# Patient Record
Sex: Male | Born: 1950 | Race: Black or African American | Hispanic: No | Marital: Single | Smoking: Current some day smoker
Health system: Southern US, Community
[De-identification: ages and names within clinical notes are randomized; demographics above are authoritative.]

## PROBLEM LIST (undated history)

## (undated) DIAGNOSIS — R51 Headache: Secondary | ICD-10-CM

## (undated) DIAGNOSIS — I10 Essential (primary) hypertension: Secondary | ICD-10-CM

## (undated) DIAGNOSIS — H9190 Unspecified hearing loss, unspecified ear: Secondary | ICD-10-CM

## (undated) DIAGNOSIS — K921 Melena: Secondary | ICD-10-CM

## (undated) DIAGNOSIS — N400 Enlarged prostate without lower urinary tract symptoms: Secondary | ICD-10-CM

## (undated) HISTORY — PX: NO PAST SURGERIES: SHX2092

---

## 2001-10-07 ENCOUNTER — Encounter: Payer: Self-pay | Admitting: *Deleted

## 2001-10-07 ENCOUNTER — Emergency Department (HOSPITAL_COMMUNITY): Admission: EM | Admit: 2001-10-07 | Discharge: 2001-10-07 | Payer: Self-pay | Admitting: *Deleted

## 2002-01-17 ENCOUNTER — Emergency Department (HOSPITAL_COMMUNITY): Admission: AC | Admit: 2002-01-17 | Discharge: 2002-01-18 | Payer: Self-pay

## 2002-01-17 ENCOUNTER — Encounter: Payer: Self-pay | Admitting: Emergency Medicine

## 2002-01-18 ENCOUNTER — Encounter: Payer: Self-pay | Admitting: Emergency Medicine

## 2002-04-23 ENCOUNTER — Emergency Department (HOSPITAL_COMMUNITY): Admission: EM | Admit: 2002-04-23 | Discharge: 2002-04-23 | Payer: Self-pay | Admitting: Emergency Medicine

## 2003-07-16 ENCOUNTER — Emergency Department (HOSPITAL_COMMUNITY): Admission: EM | Admit: 2003-07-16 | Discharge: 2003-07-16 | Payer: Self-pay | Admitting: *Deleted

## 2003-10-20 ENCOUNTER — Emergency Department (HOSPITAL_COMMUNITY): Admission: EM | Admit: 2003-10-20 | Discharge: 2003-10-20 | Payer: Self-pay | Admitting: Emergency Medicine

## 2004-02-14 ENCOUNTER — Emergency Department (HOSPITAL_COMMUNITY): Admission: EM | Admit: 2004-02-14 | Discharge: 2004-02-14 | Payer: Self-pay | Admitting: Emergency Medicine

## 2004-06-29 ENCOUNTER — Emergency Department (HOSPITAL_COMMUNITY): Admission: EM | Admit: 2004-06-29 | Discharge: 2004-06-29 | Payer: Self-pay | Admitting: Emergency Medicine

## 2004-12-29 ENCOUNTER — Ambulatory Visit: Payer: Self-pay | Admitting: Family Medicine

## 2005-03-20 ENCOUNTER — Emergency Department (HOSPITAL_COMMUNITY): Admission: EM | Admit: 2005-03-20 | Discharge: 2005-03-20 | Payer: Self-pay | Admitting: Emergency Medicine

## 2005-03-20 ENCOUNTER — Ambulatory Visit: Payer: Self-pay | Admitting: Family Medicine

## 2005-03-23 ENCOUNTER — Emergency Department (HOSPITAL_COMMUNITY): Admission: EM | Admit: 2005-03-23 | Discharge: 2005-03-23 | Payer: Self-pay | Admitting: Emergency Medicine

## 2005-06-26 ENCOUNTER — Emergency Department (HOSPITAL_COMMUNITY): Admission: EM | Admit: 2005-06-26 | Discharge: 2005-06-26 | Payer: Self-pay | Admitting: Emergency Medicine

## 2006-03-12 ENCOUNTER — Ambulatory Visit: Payer: Self-pay | Admitting: Family Medicine

## 2006-05-18 ENCOUNTER — Ambulatory Visit: Payer: Self-pay | Admitting: Family Medicine

## 2006-09-06 ENCOUNTER — Encounter: Admission: RE | Admit: 2006-09-06 | Discharge: 2006-09-06 | Payer: Self-pay | Admitting: Internal Medicine

## 2009-07-14 ENCOUNTER — Emergency Department (HOSPITAL_COMMUNITY): Admission: EM | Admit: 2009-07-14 | Discharge: 2009-07-14 | Payer: Self-pay | Admitting: Family Medicine

## 2010-05-01 ENCOUNTER — Encounter: Payer: Self-pay | Admitting: Internal Medicine

## 2010-05-14 ENCOUNTER — Emergency Department (HOSPITAL_COMMUNITY)
Admission: EM | Admit: 2010-05-14 | Discharge: 2010-05-14 | Disposition: A | Discharge status: Home / Self Care | Payer: No Typology Code available for payment source | Attending: Emergency Medicine | Admitting: Emergency Medicine

## 2010-05-14 ENCOUNTER — Emergency Department (HOSPITAL_COMMUNITY): Payer: Self-pay

## 2010-05-14 DIAGNOSIS — R4182 Altered mental status, unspecified: Secondary | ICD-10-CM | POA: Insufficient documentation

## 2010-05-14 DIAGNOSIS — M542 Cervicalgia: Secondary | ICD-10-CM | POA: Insufficient documentation

## 2010-05-14 DIAGNOSIS — R51 Headache: Secondary | ICD-10-CM | POA: Insufficient documentation

## 2010-06-29 LAB — POCT URINALYSIS DIP (DEVICE)
Glucose, UA: NEGATIVE mg/dL
Nitrite: NEGATIVE

## 2013-02-08 ENCOUNTER — Emergency Department (HOSPITAL_COMMUNITY): Payer: Medicare Other

## 2013-02-08 ENCOUNTER — Encounter (HOSPITAL_COMMUNITY): Payer: Self-pay | Admitting: Emergency Medicine

## 2013-02-08 ENCOUNTER — Emergency Department (HOSPITAL_COMMUNITY)
Admission: EM | Admit: 2013-02-08 | Discharge: 2013-02-08 | Disposition: A | Discharge status: Home / Self Care | Payer: Medicare Other | Attending: Emergency Medicine | Admitting: Emergency Medicine

## 2013-02-08 DIAGNOSIS — Z23 Encounter for immunization: Secondary | ICD-10-CM | POA: Insufficient documentation

## 2013-02-08 DIAGNOSIS — S20219A Contusion of unspecified front wall of thorax, initial encounter: Secondary | ICD-10-CM | POA: Insufficient documentation

## 2013-02-08 DIAGNOSIS — S02402A Zygomatic fracture, unspecified, initial encounter for closed fracture: Secondary | ICD-10-CM

## 2013-02-08 DIAGNOSIS — S20212A Contusion of left front wall of thorax, initial encounter: Secondary | ICD-10-CM

## 2013-02-08 DIAGNOSIS — Z8669 Personal history of other diseases of the nervous system and sense organs: Secondary | ICD-10-CM | POA: Insufficient documentation

## 2013-02-08 DIAGNOSIS — S0280XA Fracture of other specified skull and facial bones, unspecified side, initial encounter for closed fracture: Secondary | ICD-10-CM | POA: Insufficient documentation

## 2013-02-08 DIAGNOSIS — F17200 Nicotine dependence, unspecified, uncomplicated: Secondary | ICD-10-CM | POA: Insufficient documentation

## 2013-02-08 DIAGNOSIS — S0990XA Unspecified injury of head, initial encounter: Secondary | ICD-10-CM | POA: Insufficient documentation

## 2013-02-08 DIAGNOSIS — S3981XA Other specified injuries of abdomen, initial encounter: Secondary | ICD-10-CM | POA: Insufficient documentation

## 2013-02-08 DIAGNOSIS — S0510XA Contusion of eyeball and orbital tissues, unspecified eye, initial encounter: Secondary | ICD-10-CM | POA: Insufficient documentation

## 2013-02-08 HISTORY — DX: Unspecified hearing loss, unspecified ear: H91.90

## 2013-02-08 LAB — URINALYSIS, ROUTINE W REFLEX MICROSCOPIC
Ketones, ur: NEGATIVE mg/dL
Leukocytes, UA: NEGATIVE
Nitrite: NEGATIVE
Specific Gravity, Urine: >1.046 — ABNORMAL HIGH (ref 1.005–1.030)
pH: 5 (ref 5.0–8.0)

## 2013-02-08 LAB — BASIC METABOLIC PANEL
Chloride: 105 mEq/L (ref 96–112)
GFR calc Af Amer: 75 mL/min — ABNORMAL LOW (ref >90)
Potassium: 3.8 mEq/L (ref 3.5–5.1)
Sodium: 140 mEq/L (ref 135–145)

## 2013-02-08 MED ORDER — OXYCODONE-ACETAMINOPHEN 5-325 MG PO TABS: 1.0000 | ORAL_TABLET | ORAL | Status: DC | PRN

## 2013-02-08 MED ORDER — IOHEXOL 300 MG/ML  SOLN
100.0000 mL | Freq: Once | INTRAMUSCULAR | Status: AC | PRN
  Administered 2013-02-08: 100 mL via INTRAVENOUS

## 2013-02-08 MED ORDER — MORPHINE SULFATE 4 MG/ML IJ SOLN
4.0000 mg | Freq: Once | INTRAMUSCULAR | Status: AC
  Administered 2013-02-08: 4 mg via INTRAVENOUS
  Filled 2013-02-08: qty 1

## 2013-02-08 MED ORDER — TETANUS-DIPHTH-ACELL PERTUSSIS 5-2.5-18.5 LF-MCG/0.5 IM SUSP
0.5000 mL | Freq: Once | INTRAMUSCULAR | Status: AC
  Administered 2013-02-08: 0.5 mL via INTRAMUSCULAR
  Filled 2013-02-08: qty 0.5
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
## 2013-02-08 NOTE — ED Notes (Addendum)
Pt returned from xray. Ambulated to restroom without distress.

## 2013-02-08 NOTE — ED Notes (Signed)
Pt unable to void pt aware of needed void urinal at bedside
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
## 2013-02-08 NOTE — ED Notes (Signed)
PT ambulated with baseline gait; VSS; A&Ox3; no signs of distress; respirations even and unlabored; skin warm and dry; no questions upon discharge.  
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
## 2013-02-08 NOTE — ED Provider Notes (Signed)
Medical screening examination/treatment/procedure(s) were performed by non-physician practitioner and as supervising physician I was immediately available for consultation/collaboration.  EKG Interpretation   None       See other note  [name], MD 02/08/13 [phone]

## 2013-02-08 NOTE — ED Notes (Signed)
Patient transported to CT 
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
## 2013-02-18 DIAGNOSIS — S02402A Zygomatic fracture, unspecified, initial encounter for closed fracture: Secondary | ICD-10-CM | POA: Insufficient documentation

## 2013-02-18 DIAGNOSIS — S0230XA Fracture of orbital floor, unspecified side, initial encounter for closed fracture: Secondary | ICD-10-CM

## 2013-02-18 HISTORY — DX: Fracture of orbital floor, unspecified side, initial encounter for closed fracture: S02.30XA

## 2013-02-18 HISTORY — DX: Zygomatic fracture, unspecified side, initial encounter for closed fracture: S02.402A

## 2013-03-21 ENCOUNTER — Ambulatory Visit: Payer: Medicare PPO | Admitting: Internal Medicine

## 2013-04-18 ENCOUNTER — Ambulatory Visit: Payer: Medicare PPO | Admitting: Internal Medicine

## 2013-04-24 ENCOUNTER — Ambulatory Visit: Payer: Medicare PPO

## 2013-05-12 ENCOUNTER — Ambulatory Visit: Payer: Medicare PPO | Admitting: Internal Medicine

## 2013-05-17 ENCOUNTER — Ambulatory Visit: Payer: Medicare PPO

## 2013-05-23 ENCOUNTER — Ambulatory Visit: Payer: Medicare Other | Attending: Internal Medicine | Admitting: Internal Medicine

## 2013-05-23 VITALS — BP 138/84 | HR 93 | Temp 98.0°F | Resp 16 | Ht 69.5 in | Wt 190.0 lb

## 2013-05-23 DIAGNOSIS — F17200 Nicotine dependence, unspecified, uncomplicated: Secondary | ICD-10-CM | POA: Insufficient documentation

## 2013-05-23 DIAGNOSIS — Z0000 Encounter for general adult medical examination without abnormal findings: Secondary | ICD-10-CM | POA: Diagnosis not present

## 2013-05-23 DIAGNOSIS — H9190 Unspecified hearing loss, unspecified ear: Secondary | ICD-10-CM | POA: Diagnosis not present

## 2013-05-23 DIAGNOSIS — M48061 Spinal stenosis, lumbar region without neurogenic claudication: Secondary | ICD-10-CM | POA: Insufficient documentation

## 2013-05-23 DIAGNOSIS — M549 Dorsalgia, unspecified: Secondary | ICD-10-CM | POA: Insufficient documentation

## 2013-05-23 DIAGNOSIS — E559 Vitamin D deficiency, unspecified: Secondary | ICD-10-CM

## 2013-05-23 DIAGNOSIS — R21 Rash and other nonspecific skin eruption: Secondary | ICD-10-CM | POA: Diagnosis not present

## 2013-05-23 DIAGNOSIS — N400 Enlarged prostate without lower urinary tract symptoms: Secondary | ICD-10-CM | POA: Diagnosis not present

## 2013-05-23 DIAGNOSIS — E119 Type 2 diabetes mellitus without complications: Secondary | ICD-10-CM

## 2013-05-23 DIAGNOSIS — E785 Hyperlipidemia, unspecified: Secondary | ICD-10-CM | POA: Diagnosis not present

## 2013-05-23 LAB — CBC WITH DIFFERENTIAL/PLATELET
BASOS ABS: 0 10*3/uL (ref 0.0–0.1)
Basophils Relative: 1 % (ref 0–1)
EOS ABS: 0.1 10*3/uL (ref 0.0–0.7)
EOS PCT: 2 % (ref 0–5)
HEMATOCRIT: 35.1 % — AB (ref 39.0–52.0)
Hemoglobin: 11.8 g/dL — ABNORMAL LOW (ref 13.0–17.0)
LYMPHS ABS: 1.6 10*3/uL (ref 0.7–4.0)
LYMPHS PCT: 37 % (ref 12–46)
MCH: 31.3 pg (ref 26.0–34.0)
MCHC: 33.6 g/dL (ref 30.0–36.0)
MCV: 93.1 fL (ref 78.0–100.0)
MONO ABS: 0.6 10*3/uL (ref 0.1–1.0)
Monocytes Relative: 13 % — ABNORMAL HIGH (ref 3–12)
Neutro Abs: 2 10*3/uL (ref 1.7–7.7)
Neutrophils Relative %: 47 % (ref 43–77)
PLATELETS: 225 10*3/uL (ref 150–400)
RBC: 3.77 MIL/uL — ABNORMAL LOW (ref 4.22–5.81)
RDW: 15.5 % (ref 11.5–15.5)
WBC: 4.3 10*3/uL (ref 4.0–10.5)

## 2013-05-23 LAB — COMPLETE METABOLIC PANEL WITH GFR
ALT: 16 U/L (ref 0–53)
AST: 30 U/L (ref 0–37)
Albumin: 4.4 g/dL (ref 3.5–5.2)
Alkaline Phosphatase: 49 U/L (ref 39–117)
BILIRUBIN TOTAL: 0.3 mg/dL (ref 0.2–1.2)
BUN: 13 mg/dL (ref 6–23)
CALCIUM: 10.4 mg/dL (ref 8.4–10.5)
CHLORIDE: 103 meq/L (ref 96–112)
CO2: 24 meq/L (ref 19–32)
CREATININE: 1.18 mg/dL (ref 0.50–1.35)
GFR, EST AFRICAN AMERICAN: 77 mL/min
GFR, EST NON AFRICAN AMERICAN: 67 mL/min
Glucose, Bld: 96 mg/dL (ref 70–99)
Potassium: 4.5 mEq/L (ref 3.5–5.3)
Sodium: 140 mEq/L (ref 135–145)
Total Protein: 7.8 g/dL (ref 6.0–8.3)

## 2013-05-23 LAB — POCT URINALYSIS DIPSTICK
Bilirubin, UA: NEGATIVE
Glucose, UA: NEGATIVE
Ketones, UA: NEGATIVE
NITRITE UA: NEGATIVE
PH UA: 5.5
PROTEIN UA: 100
Spec Grav, UA: >=1.03
UROBILINOGEN UA: 0.2

## 2013-05-23 LAB — LIPID PANEL
CHOL/HDL RATIO: 6.1 ratio
CHOLESTEROL: 310 mg/dL — AB (ref 0–200)
HDL: 51 mg/dL (ref >39)
LDL Cholesterol: 181 mg/dL — ABNORMAL HIGH (ref 0–99)
Triglycerides: 388 mg/dL — ABNORMAL HIGH (ref <150)
VLDL: 78 mg/dL — ABNORMAL HIGH (ref 0–40)

## 2013-05-23 LAB — POCT GLYCOSYLATED HEMOGLOBIN (HGB A1C): Hemoglobin A1C: 5.3

## 2013-05-23 MED ORDER — CIPROFLOXACIN HCL 500 MG PO TABS: 500.0000 mg | ORAL_TABLET | Freq: Two times a day (BID) | ORAL | Status: DC

## 2013-05-23 MED ORDER — CYCLOBENZAPRINE HCL 10 MG PO TABS: 10.0000 mg | ORAL_TABLET | Freq: Every day | ORAL | Status: DC

## 2013-05-23 MED ORDER — TRAMADOL HCL 50 MG PO TABS: ORAL_TABLET | ORAL | Status: DC

## 2013-05-23 NOTE — Addendum Note (Signed)
Addended by: Allyson Sabal MD, Ascencion Dike on: 05/23/2013 04:44 PM   Modules accepted: Orders

## 2013-05-23 NOTE — Progress Notes (Signed)
Pt is here to establish care. Pt is here requesting to get his prostate checked
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
## 2013-05-24 ENCOUNTER — Ambulatory Visit: Payer: Medicare PPO

## 2013-05-24 LAB — TSH: TSH: 2.801 u[IU]/mL (ref 0.350–4.500)

## 2013-05-24 LAB — PSA: PSA: 1.97 ng/mL (ref <=4.00)

## 2013-05-26 ENCOUNTER — Telehealth: Payer: Self-pay | Admitting: *Deleted

## 2013-05-26 MED ORDER — GEMFIBROZIL 600 MG PO TABS: 600.0000 mg | ORAL_TABLET | Freq: Two times a day (BID) | ORAL | Status: DC

## 2013-05-26 NOTE — Telephone Encounter (Signed)
Left a voicemail for patient to give us a call back.

## 2013-05-26 NOTE — Telephone Encounter (Signed)
Patient's wife called back for patient's lab results. Notified patient of the triglycerides elevated at 388. Called in a prescription for Lopid 600 twice a day to our pharmacy. Marland Kitchen

## 2013-05-26 NOTE — Telephone Encounter (Signed)
Message copied by Magdalynn Davilla, [place] R on Mon May 26, 2013 12:31 PM ------      Message from: Allyson Sabal MD, [facility]      Created: Mon May 26, 2013 11:58 AM       Notify patient of the triglycerides elevated at 388. His call in a prescription for Lopid 600 twice a day ------

## 2013-06-03 ENCOUNTER — Telehealth: Payer: Self-pay | Admitting: Emergency Medicine

## 2013-06-03 NOTE — Telephone Encounter (Signed)
Message copied by Ricci Barker on Tue Jun 03, 2013  5:40 PM ------      Message from: Allyson Sabal MD, [facility]      Created: Mon May 26, 2013 11:58 AM       Notify patient of the triglycerides elevated at 388. His call in a prescription for Lopid 600 twice a day ------

## 2013-06-06 ENCOUNTER — Ambulatory Visit (HOSPITAL_COMMUNITY)
Admission: RE | Admit: 2013-06-06 | Discharge: 2013-06-06 | Disposition: A | Discharge status: Home / Self Care | Payer: Medicare Other | Source: Ambulatory Visit | Attending: Internal Medicine | Admitting: Internal Medicine

## 2013-06-06 ENCOUNTER — Other Ambulatory Visit: Payer: Self-pay | Admitting: Internal Medicine

## 2013-06-06 DIAGNOSIS — Z0000 Encounter for general adult medical examination without abnormal findings: Secondary | ICD-10-CM

## 2013-06-06 DIAGNOSIS — R109 Unspecified abdominal pain: Secondary | ICD-10-CM | POA: Insufficient documentation

## 2013-06-06 DIAGNOSIS — E119 Type 2 diabetes mellitus without complications: Secondary | ICD-10-CM

## 2013-06-06 DIAGNOSIS — E785 Hyperlipidemia, unspecified: Secondary | ICD-10-CM

## 2013-06-06 DIAGNOSIS — M549 Dorsalgia, unspecified: Secondary | ICD-10-CM | POA: Diagnosis not present

## 2013-06-06 DIAGNOSIS — N400 Enlarged prostate without lower urinary tract symptoms: Secondary | ICD-10-CM | POA: Diagnosis not present

## 2013-06-06 DIAGNOSIS — E559 Vitamin D deficiency, unspecified: Secondary | ICD-10-CM

## 2013-06-06 DIAGNOSIS — N281 Cyst of kidney, acquired: Secondary | ICD-10-CM | POA: Diagnosis not present

## 2013-06-09 ENCOUNTER — Ambulatory Visit: Payer: Medicare PPO | Attending: Internal Medicine

## 2013-07-19 ENCOUNTER — Other Ambulatory Visit: Payer: Self-pay | Admitting: Internal Medicine

## 2013-07-22 DIAGNOSIS — N400 Enlarged prostate without lower urinary tract symptoms: Secondary | ICD-10-CM | POA: Diagnosis not present

## 2013-07-22 DIAGNOSIS — Q619 Cystic kidney disease, unspecified: Secondary | ICD-10-CM | POA: Diagnosis not present

## 2013-07-22 DIAGNOSIS — S37019A Minor contusion of unspecified kidney, initial encounter: Secondary | ICD-10-CM | POA: Diagnosis not present

## 2013-08-20 ENCOUNTER — Ambulatory Visit: Payer: Medicare PPO | Admitting: Internal Medicine

## 2013-09-24 ENCOUNTER — Other Ambulatory Visit: Payer: Self-pay | Admitting: Gastroenterology

## 2013-09-26 ENCOUNTER — Encounter (HOSPITAL_COMMUNITY): Payer: Self-pay | Admitting: Pharmacy Technician

## 2013-09-29 ENCOUNTER — Encounter (HOSPITAL_COMMUNITY): Payer: Self-pay | Admitting: *Deleted

## 2013-10-09 ENCOUNTER — Ambulatory Visit (HOSPITAL_COMMUNITY)
Admission: RE | Admit: 2013-10-09 | Discharge: 2013-10-09 | Disposition: A | Discharge status: Home / Self Care | Payer: Medicare Other | Source: Ambulatory Visit | Attending: Gastroenterology | Admitting: Gastroenterology

## 2013-10-09 ENCOUNTER — Ambulatory Visit (HOSPITAL_COMMUNITY): Payer: Medicare Other | Admitting: Anesthesiology

## 2013-10-09 ENCOUNTER — Encounter (HOSPITAL_COMMUNITY): Payer: Medicare Other | Admitting: Anesthesiology

## 2013-10-09 ENCOUNTER — Encounter (HOSPITAL_COMMUNITY): Payer: Self-pay | Admitting: *Deleted

## 2013-10-09 ENCOUNTER — Encounter (HOSPITAL_COMMUNITY)
Admission: RE | Disposition: A | Discharge status: Home / Self Care | Payer: Self-pay | Source: Ambulatory Visit | Attending: Gastroenterology

## 2013-10-09 DIAGNOSIS — K921 Melena: Secondary | ICD-10-CM | POA: Insufficient documentation

## 2013-10-09 DIAGNOSIS — Z1211 Encounter for screening for malignant neoplasm of colon: Secondary | ICD-10-CM | POA: Diagnosis not present

## 2013-10-09 DIAGNOSIS — F17200 Nicotine dependence, unspecified, uncomplicated: Secondary | ICD-10-CM | POA: Diagnosis not present

## 2013-10-09 DIAGNOSIS — Z85038 Personal history of other malignant neoplasm of large intestine: Secondary | ICD-10-CM | POA: Insufficient documentation

## 2013-10-09 HISTORY — DX: Melena: K92.1

## 2013-10-09 HISTORY — DX: Headache: R51

## 2013-10-09 HISTORY — DX: Benign prostatic hyperplasia without lower urinary tract symptoms: N40.0

## 2013-10-09 HISTORY — PX: COLONOSCOPY WITH PROPOFOL: SHX5780

## 2013-10-09 SURGERY — COLONOSCOPY WITH PROPOFOL
Anesthesia: Monitor Anesthesia Care

## 2013-10-09 MED ORDER — LIDOCAINE HCL (CARDIAC) 20 MG/ML IV SOLN
INTRAVENOUS | Status: AC
  Filled 2013-10-09: qty 5

## 2013-10-09 MED ORDER — LIDOCAINE HCL (CARDIAC) 20 MG/ML IV SOLN
INTRAVENOUS | Status: DC | PRN
Start: 2013-10-09 — End: 2013-10-09
  Administered 2013-10-09: 100 mg via INTRAVENOUS

## 2013-10-09 MED ORDER — PROPOFOL 10 MG/ML IV BOLUS
INTRAVENOUS | Status: AC
  Filled 2013-10-09: qty 20

## 2013-10-09 MED ORDER — SODIUM CHLORIDE 0.9 % IV SOLN: INTRAVENOUS | Status: DC

## 2013-10-09 MED ORDER — PROPOFOL INFUSION 10 MG/ML OPTIME
INTRAVENOUS | Status: DC | PRN
  Administered 2013-10-09: 140 ug/kg/min via INTRAVENOUS

## 2013-10-09 MED ORDER — PROPOFOL 10 MG/ML IV BOLUS
INTRAVENOUS | Status: DC | PRN
  Administered 2013-10-09: 100 mg via INTRAVENOUS

## 2013-10-09 SURGICAL SUPPLY — 22 items

## 2013-10-09 NOTE — Anesthesia Preprocedure Evaluation (Addendum)
Anesthesia Evaluation  Patient identified by MRN, date of birth, ID band Patient awake  General Assessment Comment:Very HOH  Reviewed: Allergy & Precautions, H&P , NPO status , Patient's Chart, lab work & pertinent test results  Airway Mallampati: II TM Distance: >3 FB Neck ROM: Full    Dental no notable dental hx. (+) Edentulous Upper   Pulmonary neg pulmonary ROS, Current Smoker,  breath sounds clear to auscultation  Pulmonary exam normal       Cardiovascular negative cardio ROS  Rhythm:Regular Rate:Normal     Neuro/Psych negative neurological ROS  negative psych ROS   GI/Hepatic negative GI ROS, Neg liver ROS,   Endo/Other  negative endocrine ROS  Renal/GU negative Renal ROS  negative genitourinary   Musculoskeletal negative musculoskeletal ROS (+)   Abdominal   Peds negative pediatric ROS (+)  Hematology negative hematology ROS (+)   Anesthesia Other Findings   Reproductive/Obstetrics negative OB ROS                          Anesthesia Physical Anesthesia Plan  ASA: II  Anesthesia Plan: MAC   Post-op Pain Management:    Induction:   Airway Management Planned: Simple Face Mask  Additional Equipment:   Intra-op Plan:   Post-operative Plan:   Informed Consent: I have reviewed the patients History and Physical, chart, labs and discussed the procedure including the risks, benefits and alternatives for the proposed anesthesia with the patient or authorized representative who has indicated his/her understanding and acceptance.   Dental advisory given  Plan Discussed with: CRNA  Anesthesia Plan Comments:         Anesthesia Quick Evaluation

## 2013-10-09 NOTE — Transfer of Care (Signed)
Immediate Anesthesia Transfer of Care Note  Patient: Seth Santana  Procedure(s) Performed: Procedure(s): COLONOSCOPY WITH PROPOFOL (N/A)  Patient Location: PACU and Endoscopy Unit  Anesthesia Type:MAC  Level of Consciousness: awake and alert   Airway & Oxygen Therapy: Patient Spontanous Breathing and Patient connected to face mask oxygen  Post-op Assessment: Report given to PACU RN and Post -op Vital signs reviewed and stable  Post vital signs: Reviewed and stable  Complications: No apparent anesthesia complications

## 2013-10-09 NOTE — Op Note (Signed)
Problem: Intermittent painless hematochezia  Endoscopist: Earle Gell  Premedication: Propofol administered by anesthesia  Procedure: Diagnostic colonoscopy The patient was placed in the left lateral decubitus position. Anal inspection and digital rectal exam were normal. The Pentax pediatric colonoscope was introduced into the rectum and advanced to the cecum. A normal-appearing ileocecal valve and appendiceal orifice were identified. Colonic preparation for the exam today was good.  Rectum. Normal. Retroflex view of the distal rectum normal  Sigmoid colon and descending colon. Normal  Splenic flexure. Normal  Transverse colon. Normal  Hepatic flexure. Normal  Ascending colon. Normal  Cecum and ileocecal valve. Normal  Assessment: Normal screening proctocolonoscopy to the cecum  Recommendation: Schedule repeat screening colonoscopy in 10 years

## 2013-10-09 NOTE — H&P (Signed)
  Problem: Painless hematochezia  History: The patient is a 64 year old male born Apr 23, 1950. He is scheduled to undergo a diagnostic colonoscopy to evaluate intermittent painless hematochezia associated with otherwise normal bowel movements. His brother was diagnosed with colon cancer.  Medication allergies: None  Past medical history: Hard of hearing  Exam: The patient is alert and lying comfortably on the endoscopy stretcher. Cardiac exam reveals a regular rhythm. Lungs are clear to auscultation. Abdomen is soft and nontender to palpation  Plan: Proceed with diagnostic colonoscopy

## 2013-10-09 NOTE — Discharge Instructions (Signed)

## 2013-10-10 ENCOUNTER — Encounter (HOSPITAL_COMMUNITY): Payer: Self-pay | Admitting: Gastroenterology

## 2013-10-19 NOTE — Anesthesia Postprocedure Evaluation (Signed)
  Anesthesia Post-op Note  Patient: Seth Santana  Procedure(s) Performed: Procedure(s) (LRB): COLONOSCOPY WITH PROPOFOL (N/A)  Patient Location: PACU  Anesthesia Type: MAC  Level of Consciousness: awake and alert   Airway and Oxygen Therapy: Patient Spontanous Breathing  Post-op Pain: mild  Post-op Assessment: Post-op Vital signs reviewed, Patient's Cardiovascular Status Stable, Respiratory Function Stable, Patent Airway and No signs of Nausea or vomiting  Last Vitals:  Filed Vitals:   10/09/13 1430  BP: 176/94  Pulse: 69  Temp:   Resp: 22    Post-op Vital Signs: stable   Complications: No apparent anesthesia complications

## 2013-10-24 DIAGNOSIS — R319 Hematuria, unspecified: Secondary | ICD-10-CM | POA: Diagnosis not present

## 2013-10-24 DIAGNOSIS — Q619 Cystic kidney disease, unspecified: Secondary | ICD-10-CM | POA: Diagnosis not present

## 2013-10-24 DIAGNOSIS — N281 Cyst of kidney, acquired: Secondary | ICD-10-CM | POA: Diagnosis not present

## 2013-10-27 DIAGNOSIS — S37019A Minor contusion of unspecified kidney, initial encounter: Secondary | ICD-10-CM | POA: Diagnosis not present

## 2013-10-27 DIAGNOSIS — N400 Enlarged prostate without lower urinary tract symptoms: Secondary | ICD-10-CM | POA: Diagnosis not present

## 2013-10-27 DIAGNOSIS — Q619 Cystic kidney disease, unspecified: Secondary | ICD-10-CM | POA: Diagnosis not present

## 2014-04-09 ENCOUNTER — Other Ambulatory Visit: Payer: Self-pay | Admitting: Internal Medicine

## 2015-01-15 ENCOUNTER — Emergency Department (INDEPENDENT_AMBULATORY_CARE_PROVIDER_SITE_OTHER): Payer: Medicare Other

## 2015-01-15 ENCOUNTER — Encounter (HOSPITAL_COMMUNITY): Payer: Self-pay | Admitting: Emergency Medicine

## 2015-01-15 ENCOUNTER — Emergency Department (INDEPENDENT_AMBULATORY_CARE_PROVIDER_SITE_OTHER)
Admission: EM | Admit: 2015-01-15 | Discharge: 2015-01-16 | Disposition: A | Discharge status: Home / Self Care | Payer: Medicare Other | Source: Home / Self Care | Attending: Family Medicine | Admitting: Family Medicine

## 2015-01-15 ENCOUNTER — Emergency Department (HOSPITAL_COMMUNITY): Payer: Medicare Other

## 2015-01-15 DIAGNOSIS — M79661 Pain in right lower leg: Secondary | ICD-10-CM | POA: Diagnosis not present

## 2015-01-15 DIAGNOSIS — S8992XA Unspecified injury of left lower leg, initial encounter: Secondary | ICD-10-CM

## 2015-01-15 DIAGNOSIS — S8991XA Unspecified injury of right lower leg, initial encounter: Secondary | ICD-10-CM

## 2015-01-15 DIAGNOSIS — M79662 Pain in left lower leg: Secondary | ICD-10-CM | POA: Diagnosis not present

## 2015-01-15 DIAGNOSIS — M25571 Pain in right ankle and joints of right foot: Secondary | ICD-10-CM | POA: Diagnosis not present

## 2015-01-15 MED ORDER — TRAMADOL HCL 50 MG PO TABS: 50.0000 mg | ORAL_TABLET | Freq: Two times a day (BID) | ORAL | Status: DC | PRN
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
## 2015-01-15 NOTE — Discharge Instructions (Signed)
It was a pleasure to see you today.   I do not see evidence of a fracture of the legs on the x-rays.  I ask that you not bear weight on the right leg until you are seen by your primary doctor in follow-up.   I am giving you a prescription for Tramadol 50mg , take 1 tablet by mouth every 12 hours as needed for pain. Do not take with alcohol.   It is important that you follow up with your primary doctor in the coming 1-2 weeks to see how you are doing.

## 2015-04-22 DIAGNOSIS — E785 Hyperlipidemia, unspecified: Secondary | ICD-10-CM | POA: Diagnosis not present

## 2015-04-22 DIAGNOSIS — Z125 Encounter for screening for malignant neoplasm of prostate: Secondary | ICD-10-CM | POA: Diagnosis not present

## 2015-04-22 DIAGNOSIS — G621 Alcoholic polyneuropathy: Secondary | ICD-10-CM | POA: Diagnosis not present

## 2015-04-22 DIAGNOSIS — N400 Enlarged prostate without lower urinary tract symptoms: Secondary | ICD-10-CM | POA: Diagnosis not present

## 2015-07-01 DIAGNOSIS — Z0000 Encounter for general adult medical examination without abnormal findings: Secondary | ICD-10-CM | POA: Diagnosis not present

## 2015-07-01 DIAGNOSIS — E663 Overweight: Secondary | ICD-10-CM | POA: Diagnosis not present

## 2015-07-01 DIAGNOSIS — E785 Hyperlipidemia, unspecified: Secondary | ICD-10-CM | POA: Diagnosis not present

## 2015-07-27 DIAGNOSIS — F17210 Nicotine dependence, cigarettes, uncomplicated: Secondary | ICD-10-CM | POA: Diagnosis not present
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
## 2016-02-29 ENCOUNTER — Ambulatory Visit: Payer: Medicare Other | Admitting: Family Medicine

## 2016-06-16 DIAGNOSIS — R809 Proteinuria, unspecified: Secondary | ICD-10-CM | POA: Diagnosis not present

## 2016-06-16 DIAGNOSIS — Z5181 Encounter for therapeutic drug level monitoring: Secondary | ICD-10-CM | POA: Diagnosis not present

## 2016-06-16 DIAGNOSIS — G4700 Insomnia, unspecified: Secondary | ICD-10-CM | POA: Diagnosis not present

## 2016-06-16 DIAGNOSIS — Z125 Encounter for screening for malignant neoplasm of prostate: Secondary | ICD-10-CM | POA: Diagnosis not present

## 2016-06-16 DIAGNOSIS — I10 Essential (primary) hypertension: Secondary | ICD-10-CM | POA: Diagnosis not present

## 2016-06-16 DIAGNOSIS — Z131 Encounter for screening for diabetes mellitus: Secondary | ICD-10-CM | POA: Diagnosis not present

## 2016-07-03 DIAGNOSIS — Z720 Tobacco use: Secondary | ICD-10-CM | POA: Diagnosis not present

## 2016-07-03 DIAGNOSIS — E559 Vitamin D deficiency, unspecified: Secondary | ICD-10-CM | POA: Diagnosis not present

## 2016-07-03 DIAGNOSIS — G4700 Insomnia, unspecified: Secondary | ICD-10-CM | POA: Diagnosis not present

## 2016-07-03 DIAGNOSIS — R202 Paresthesia of skin: Secondary | ICD-10-CM | POA: Diagnosis not present

## 2016-07-03 DIAGNOSIS — E785 Hyperlipidemia, unspecified: Secondary | ICD-10-CM | POA: Diagnosis not present

## 2016-07-03 DIAGNOSIS — I10 Essential (primary) hypertension: Secondary | ICD-10-CM | POA: Diagnosis not present

## 2016-08-22 DIAGNOSIS — R202 Paresthesia of skin: Secondary | ICD-10-CM | POA: Diagnosis not present

## 2016-08-22 DIAGNOSIS — E785 Hyperlipidemia, unspecified: Secondary | ICD-10-CM | POA: Diagnosis not present

## 2016-08-22 DIAGNOSIS — E559 Vitamin D deficiency, unspecified: Secondary | ICD-10-CM | POA: Diagnosis not present

## 2016-08-22 DIAGNOSIS — Z720 Tobacco use: Secondary | ICD-10-CM | POA: Diagnosis not present

## 2016-08-22 DIAGNOSIS — I10 Essential (primary) hypertension: Secondary | ICD-10-CM | POA: Diagnosis not present

## 2016-08-22 DIAGNOSIS — G4700 Insomnia, unspecified: Secondary | ICD-10-CM | POA: Diagnosis not present

## 2016-10-18 DIAGNOSIS — R310 Gross hematuria: Secondary | ICD-10-CM | POA: Diagnosis not present

## 2016-10-18 DIAGNOSIS — Z125 Encounter for screening for malignant neoplasm of prostate: Secondary | ICD-10-CM | POA: Diagnosis not present

## 2016-10-18 DIAGNOSIS — N281 Cyst of kidney, acquired: Secondary | ICD-10-CM | POA: Diagnosis not present

## 2017-05-08 DIAGNOSIS — E559 Vitamin D deficiency, unspecified: Secondary | ICD-10-CM | POA: Diagnosis not present

## 2017-05-08 DIAGNOSIS — Z5181 Encounter for therapeutic drug level monitoring: Secondary | ICD-10-CM | POA: Diagnosis not present

## 2017-05-08 DIAGNOSIS — Z01118 Encounter for examination of ears and hearing with other abnormal findings: Secondary | ICD-10-CM | POA: Diagnosis not present

## 2017-05-08 DIAGNOSIS — E669 Obesity, unspecified: Secondary | ICD-10-CM | POA: Diagnosis not present

## 2017-05-08 DIAGNOSIS — R7303 Prediabetes: Secondary | ICD-10-CM | POA: Diagnosis not present

## 2017-05-08 DIAGNOSIS — E785 Hyperlipidemia, unspecified: Secondary | ICD-10-CM | POA: Diagnosis not present

## 2017-05-08 DIAGNOSIS — N400 Enlarged prostate without lower urinary tract symptoms: Secondary | ICD-10-CM | POA: Diagnosis not present

## 2017-05-08 DIAGNOSIS — Z131 Encounter for screening for diabetes mellitus: Secondary | ICD-10-CM | POA: Diagnosis not present

## 2017-05-08 DIAGNOSIS — H538 Other visual disturbances: Secondary | ICD-10-CM | POA: Diagnosis not present

## 2017-05-08 DIAGNOSIS — Z720 Tobacco use: Secondary | ICD-10-CM | POA: Diagnosis not present

## 2017-05-08 DIAGNOSIS — I10 Essential (primary) hypertension: Secondary | ICD-10-CM | POA: Diagnosis not present

## 2017-05-08 DIAGNOSIS — N182 Chronic kidney disease, stage 2 (mild): Secondary | ICD-10-CM | POA: Diagnosis not present

## 2017-06-26 DIAGNOSIS — N400 Enlarged prostate without lower urinary tract symptoms: Secondary | ICD-10-CM | POA: Diagnosis not present

## 2017-06-26 DIAGNOSIS — E669 Obesity, unspecified: Secondary | ICD-10-CM | POA: Diagnosis not present

## 2017-06-26 DIAGNOSIS — I10 Essential (primary) hypertension: Secondary | ICD-10-CM | POA: Diagnosis not present

## 2017-06-26 DIAGNOSIS — E785 Hyperlipidemia, unspecified: Secondary | ICD-10-CM | POA: Diagnosis not present

## 2017-06-26 DIAGNOSIS — Z720 Tobacco use: Secondary | ICD-10-CM | POA: Diagnosis not present

## 2017-06-26 DIAGNOSIS — R7303 Prediabetes: Secondary | ICD-10-CM | POA: Diagnosis not present

## 2017-06-26 DIAGNOSIS — N182 Chronic kidney disease, stage 2 (mild): Secondary | ICD-10-CM | POA: Diagnosis not present

## 2017-06-26 DIAGNOSIS — E559 Vitamin D deficiency, unspecified: Secondary | ICD-10-CM | POA: Diagnosis not present

## 2017-06-26 DIAGNOSIS — G629 Polyneuropathy, unspecified: Secondary | ICD-10-CM | POA: Diagnosis not present

## 2017-10-23 DIAGNOSIS — E785 Hyperlipidemia, unspecified: Secondary | ICD-10-CM | POA: Diagnosis not present

## 2017-10-23 DIAGNOSIS — N400 Enlarged prostate without lower urinary tract symptoms: Secondary | ICD-10-CM | POA: Diagnosis not present

## 2017-10-23 DIAGNOSIS — G629 Polyneuropathy, unspecified: Secondary | ICD-10-CM | POA: Diagnosis not present

## 2017-10-23 DIAGNOSIS — I10 Essential (primary) hypertension: Secondary | ICD-10-CM | POA: Diagnosis not present

## 2017-10-23 DIAGNOSIS — Z720 Tobacco use: Secondary | ICD-10-CM | POA: Diagnosis not present

## 2017-10-23 DIAGNOSIS — N182 Chronic kidney disease, stage 2 (mild): Secondary | ICD-10-CM | POA: Diagnosis not present

## 2017-10-23 DIAGNOSIS — E669 Obesity, unspecified: Secondary | ICD-10-CM | POA: Diagnosis not present

## 2017-10-23 DIAGNOSIS — D6489 Other specified anemias: Secondary | ICD-10-CM | POA: Diagnosis not present

## 2017-10-23 DIAGNOSIS — E559 Vitamin D deficiency, unspecified: Secondary | ICD-10-CM | POA: Diagnosis not present

## 2017-10-23 DIAGNOSIS — R7303 Prediabetes: Secondary | ICD-10-CM | POA: Diagnosis not present

## 2017-10-23 DIAGNOSIS — Z0000 Encounter for general adult medical examination without abnormal findings: Secondary | ICD-10-CM | POA: Diagnosis not present

## 2017-10-23 DIAGNOSIS — Z0001 Encounter for general adult medical examination with abnormal findings: Secondary | ICD-10-CM | POA: Diagnosis not present

## 2017-10-23 DIAGNOSIS — R740 Nonspecific elevation of levels of transaminase and lactic acid dehydrogenase [LDH]: Secondary | ICD-10-CM | POA: Diagnosis not present

## 2018-01-24 DIAGNOSIS — G629 Polyneuropathy, unspecified: Secondary | ICD-10-CM | POA: Diagnosis not present

## 2018-01-24 DIAGNOSIS — N182 Chronic kidney disease, stage 2 (mild): Secondary | ICD-10-CM | POA: Diagnosis not present

## 2018-01-24 DIAGNOSIS — E559 Vitamin D deficiency, unspecified: Secondary | ICD-10-CM | POA: Diagnosis not present

## 2018-01-24 DIAGNOSIS — Z720 Tobacco use: Secondary | ICD-10-CM | POA: Diagnosis not present

## 2018-01-24 DIAGNOSIS — N400 Enlarged prostate without lower urinary tract symptoms: Secondary | ICD-10-CM | POA: Diagnosis not present

## 2018-01-24 DIAGNOSIS — E7800 Pure hypercholesterolemia, unspecified: Secondary | ICD-10-CM | POA: Diagnosis not present

## 2018-01-24 DIAGNOSIS — E669 Obesity, unspecified: Secondary | ICD-10-CM | POA: Diagnosis not present

## 2018-01-24 DIAGNOSIS — I10 Essential (primary) hypertension: Secondary | ICD-10-CM | POA: Diagnosis not present

## 2018-01-24 DIAGNOSIS — R7303 Prediabetes: Secondary | ICD-10-CM | POA: Diagnosis not present

## 2018-02-04 DIAGNOSIS — D509 Iron deficiency anemia, unspecified: Secondary | ICD-10-CM | POA: Diagnosis not present

## 2018-02-04 DIAGNOSIS — N19 Unspecified kidney failure: Secondary | ICD-10-CM | POA: Diagnosis not present

## 2018-02-04 DIAGNOSIS — I10 Essential (primary) hypertension: Secondary | ICD-10-CM | POA: Diagnosis not present

## 2018-02-26 DIAGNOSIS — K2950 Unspecified chronic gastritis without bleeding: Secondary | ICD-10-CM | POA: Diagnosis not present

## 2018-02-26 DIAGNOSIS — K2990 Gastroduodenitis, unspecified, without bleeding: Secondary | ICD-10-CM | POA: Diagnosis not present

## 2018-02-26 DIAGNOSIS — D122 Benign neoplasm of ascending colon: Secondary | ICD-10-CM | POA: Diagnosis not present

## 2018-02-26 DIAGNOSIS — D124 Benign neoplasm of descending colon: Secondary | ICD-10-CM | POA: Diagnosis not present

## 2018-02-26 DIAGNOSIS — K635 Polyp of colon: Secondary | ICD-10-CM | POA: Diagnosis not present

## 2018-02-26 DIAGNOSIS — B9681 Helicobacter pylori [H. pylori] as the cause of diseases classified elsewhere: Secondary | ICD-10-CM | POA: Diagnosis not present

## 2018-02-26 DIAGNOSIS — D509 Iron deficiency anemia, unspecified: Secondary | ICD-10-CM | POA: Diagnosis not present

## 2018-02-26 DIAGNOSIS — K2970 Gastritis, unspecified, without bleeding: Secondary | ICD-10-CM | POA: Diagnosis not present

## 2018-02-28 DIAGNOSIS — R7303 Prediabetes: Secondary | ICD-10-CM | POA: Diagnosis not present

## 2018-02-28 DIAGNOSIS — G629 Polyneuropathy, unspecified: Secondary | ICD-10-CM | POA: Diagnosis not present

## 2018-02-28 DIAGNOSIS — E7800 Pure hypercholesterolemia, unspecified: Secondary | ICD-10-CM | POA: Diagnosis not present

## 2018-02-28 DIAGNOSIS — N182 Chronic kidney disease, stage 2 (mild): Secondary | ICD-10-CM | POA: Diagnosis not present

## 2018-02-28 DIAGNOSIS — E559 Vitamin D deficiency, unspecified: Secondary | ICD-10-CM | POA: Diagnosis not present

## 2018-02-28 DIAGNOSIS — N400 Enlarged prostate without lower urinary tract symptoms: Secondary | ICD-10-CM | POA: Diagnosis not present

## 2018-02-28 DIAGNOSIS — I10 Essential (primary) hypertension: Secondary | ICD-10-CM | POA: Diagnosis not present

## 2018-02-28 DIAGNOSIS — Z720 Tobacco use: Secondary | ICD-10-CM | POA: Diagnosis not present

## 2018-02-28 DIAGNOSIS — Z23 Encounter for immunization: Secondary | ICD-10-CM | POA: Diagnosis not present

## 2018-02-28 DIAGNOSIS — E669 Obesity, unspecified: Secondary | ICD-10-CM | POA: Diagnosis not present

## 2018-06-04 DIAGNOSIS — N400 Enlarged prostate without lower urinary tract symptoms: Secondary | ICD-10-CM | POA: Diagnosis not present

## 2018-06-04 DIAGNOSIS — E7800 Pure hypercholesterolemia, unspecified: Secondary | ICD-10-CM | POA: Diagnosis not present

## 2018-06-04 DIAGNOSIS — E669 Obesity, unspecified: Secondary | ICD-10-CM | POA: Diagnosis not present

## 2018-06-04 DIAGNOSIS — R7303 Prediabetes: Secondary | ICD-10-CM | POA: Diagnosis not present

## 2018-06-04 DIAGNOSIS — N182 Chronic kidney disease, stage 2 (mild): Secondary | ICD-10-CM | POA: Diagnosis not present

## 2018-06-04 DIAGNOSIS — I10 Essential (primary) hypertension: Secondary | ICD-10-CM | POA: Diagnosis not present

## 2018-06-04 DIAGNOSIS — Z720 Tobacco use: Secondary | ICD-10-CM | POA: Diagnosis not present

## 2018-06-04 DIAGNOSIS — G629 Polyneuropathy, unspecified: Secondary | ICD-10-CM | POA: Diagnosis not present

## 2018-06-04 DIAGNOSIS — E559 Vitamin D deficiency, unspecified: Secondary | ICD-10-CM | POA: Diagnosis not present

## 2018-06-10 DIAGNOSIS — D509 Iron deficiency anemia, unspecified: Secondary | ICD-10-CM | POA: Diagnosis not present

## 2018-06-10 DIAGNOSIS — A048 Other specified bacterial intestinal infections: Secondary | ICD-10-CM | POA: Diagnosis not present

## 2018-06-17 DIAGNOSIS — B353 Tinea pedis: Secondary | ICD-10-CM | POA: Diagnosis not present

## 2018-06-17 DIAGNOSIS — M79671 Pain in right foot: Secondary | ICD-10-CM | POA: Diagnosis not present

## 2018-06-17 DIAGNOSIS — M79672 Pain in left foot: Secondary | ICD-10-CM | POA: Diagnosis not present

## 2018-06-17 DIAGNOSIS — L600 Ingrowing nail: Secondary | ICD-10-CM | POA: Diagnosis not present

## 2018-07-08 DIAGNOSIS — D649 Anemia, unspecified: Secondary | ICD-10-CM | POA: Diagnosis not present

## 2018-07-08 DIAGNOSIS — I129 Hypertensive chronic kidney disease with stage 1 through stage 4 chronic kidney disease, or unspecified chronic kidney disease: Secondary | ICD-10-CM | POA: Diagnosis not present

## 2018-07-08 DIAGNOSIS — N182 Chronic kidney disease, stage 2 (mild): Secondary | ICD-10-CM | POA: Diagnosis not present

## 2018-07-25 DIAGNOSIS — L600 Ingrowing nail: Secondary | ICD-10-CM | POA: Diagnosis not present

## 2018-07-25 DIAGNOSIS — B351 Tinea unguium: Secondary | ICD-10-CM | POA: Diagnosis not present

## 2018-07-25 DIAGNOSIS — B353 Tinea pedis: Secondary | ICD-10-CM | POA: Diagnosis not present

## 2018-07-25 DIAGNOSIS — M79675 Pain in left toe(s): Secondary | ICD-10-CM | POA: Diagnosis not present

## 2018-07-25 DIAGNOSIS — M79674 Pain in right toe(s): Secondary | ICD-10-CM | POA: Diagnosis not present

## 2018-08-05 DIAGNOSIS — A048 Other specified bacterial intestinal infections: Secondary | ICD-10-CM | POA: Diagnosis not present

## 2018-09-04 DIAGNOSIS — E559 Vitamin D deficiency, unspecified: Secondary | ICD-10-CM | POA: Diagnosis not present

## 2018-09-04 DIAGNOSIS — E7800 Pure hypercholesterolemia, unspecified: Secondary | ICD-10-CM | POA: Diagnosis not present

## 2018-09-04 DIAGNOSIS — Z131 Encounter for screening for diabetes mellitus: Secondary | ICD-10-CM | POA: Diagnosis not present

## 2018-09-04 DIAGNOSIS — Z720 Tobacco use: Secondary | ICD-10-CM | POA: Diagnosis not present

## 2018-09-04 DIAGNOSIS — N182 Chronic kidney disease, stage 2 (mild): Secondary | ICD-10-CM | POA: Diagnosis not present

## 2018-09-04 DIAGNOSIS — Z125 Encounter for screening for malignant neoplasm of prostate: Secondary | ICD-10-CM | POA: Diagnosis not present

## 2018-09-04 DIAGNOSIS — N400 Enlarged prostate without lower urinary tract symptoms: Secondary | ICD-10-CM | POA: Diagnosis not present

## 2018-09-04 DIAGNOSIS — M1710 Unilateral primary osteoarthritis, unspecified knee: Secondary | ICD-10-CM | POA: Diagnosis not present

## 2018-09-04 DIAGNOSIS — E669 Obesity, unspecified: Secondary | ICD-10-CM | POA: Diagnosis not present

## 2018-09-04 DIAGNOSIS — I10 Essential (primary) hypertension: Secondary | ICD-10-CM | POA: Diagnosis not present

## 2018-09-04 DIAGNOSIS — R7303 Prediabetes: Secondary | ICD-10-CM | POA: Diagnosis not present

## 2018-09-04 DIAGNOSIS — G629 Polyneuropathy, unspecified: Secondary | ICD-10-CM | POA: Diagnosis not present

## 2018-09-25 DIAGNOSIS — D649 Anemia, unspecified: Secondary | ICD-10-CM | POA: Diagnosis not present

## 2018-10-29 DIAGNOSIS — E7800 Pure hypercholesterolemia, unspecified: Secondary | ICD-10-CM | POA: Diagnosis not present

## 2018-10-29 DIAGNOSIS — Z0001 Encounter for general adult medical examination with abnormal findings: Secondary | ICD-10-CM | POA: Diagnosis not present

## 2018-10-29 DIAGNOSIS — Z1389 Encounter for screening for other disorder: Secondary | ICD-10-CM | POA: Diagnosis not present

## 2018-10-29 DIAGNOSIS — N182 Chronic kidney disease, stage 2 (mild): Secondary | ICD-10-CM | POA: Diagnosis not present

## 2018-10-29 DIAGNOSIS — I10 Essential (primary) hypertension: Secondary | ICD-10-CM | POA: Diagnosis not present

## 2018-10-29 DIAGNOSIS — G629 Polyneuropathy, unspecified: Secondary | ICD-10-CM | POA: Diagnosis not present

## 2018-10-29 DIAGNOSIS — E669 Obesity, unspecified: Secondary | ICD-10-CM | POA: Diagnosis not present

## 2018-10-29 DIAGNOSIS — Z720 Tobacco use: Secondary | ICD-10-CM | POA: Diagnosis not present

## 2018-10-29 DIAGNOSIS — R7303 Prediabetes: Secondary | ICD-10-CM | POA: Diagnosis not present

## 2018-10-29 DIAGNOSIS — E559 Vitamin D deficiency, unspecified: Secondary | ICD-10-CM | POA: Diagnosis not present

## 2018-10-29 DIAGNOSIS — M1710 Unilateral primary osteoarthritis, unspecified knee: Secondary | ICD-10-CM | POA: Diagnosis not present

## 2018-10-29 DIAGNOSIS — N400 Enlarged prostate without lower urinary tract symptoms: Secondary | ICD-10-CM | POA: Diagnosis not present

## 2018-12-04 DIAGNOSIS — G629 Polyneuropathy, unspecified: Secondary | ICD-10-CM | POA: Diagnosis not present

## 2018-12-04 DIAGNOSIS — N182 Chronic kidney disease, stage 2 (mild): Secondary | ICD-10-CM | POA: Diagnosis not present

## 2018-12-04 DIAGNOSIS — E7800 Pure hypercholesterolemia, unspecified: Secondary | ICD-10-CM | POA: Diagnosis not present

## 2018-12-04 DIAGNOSIS — N400 Enlarged prostate without lower urinary tract symptoms: Secondary | ICD-10-CM | POA: Diagnosis not present

## 2018-12-04 DIAGNOSIS — M1710 Unilateral primary osteoarthritis, unspecified knee: Secondary | ICD-10-CM | POA: Diagnosis not present

## 2018-12-04 DIAGNOSIS — E559 Vitamin D deficiency, unspecified: Secondary | ICD-10-CM | POA: Diagnosis not present

## 2018-12-04 DIAGNOSIS — R7303 Prediabetes: Secondary | ICD-10-CM | POA: Diagnosis not present

## 2018-12-04 DIAGNOSIS — Z720 Tobacco use: Secondary | ICD-10-CM | POA: Diagnosis not present

## 2018-12-04 DIAGNOSIS — E669 Obesity, unspecified: Secondary | ICD-10-CM | POA: Diagnosis not present

## 2018-12-04 DIAGNOSIS — I10 Essential (primary) hypertension: Secondary | ICD-10-CM | POA: Diagnosis not present

## 2019-01-28 DIAGNOSIS — E559 Vitamin D deficiency, unspecified: Secondary | ICD-10-CM | POA: Diagnosis not present

## 2019-01-28 DIAGNOSIS — G629 Polyneuropathy, unspecified: Secondary | ICD-10-CM | POA: Diagnosis not present

## 2019-01-28 DIAGNOSIS — N182 Chronic kidney disease, stage 2 (mild): Secondary | ICD-10-CM | POA: Diagnosis not present

## 2019-01-28 DIAGNOSIS — Z720 Tobacco use: Secondary | ICD-10-CM | POA: Diagnosis not present

## 2019-01-28 DIAGNOSIS — E669 Obesity, unspecified: Secondary | ICD-10-CM | POA: Diagnosis not present

## 2019-01-28 DIAGNOSIS — R7303 Prediabetes: Secondary | ICD-10-CM | POA: Diagnosis not present

## 2019-01-28 DIAGNOSIS — E7800 Pure hypercholesterolemia, unspecified: Secondary | ICD-10-CM | POA: Diagnosis not present

## 2019-01-28 DIAGNOSIS — I10 Essential (primary) hypertension: Secondary | ICD-10-CM | POA: Diagnosis not present

## 2019-01-28 DIAGNOSIS — M1710 Unilateral primary osteoarthritis, unspecified knee: Secondary | ICD-10-CM | POA: Diagnosis not present

## 2019-01-28 DIAGNOSIS — N400 Enlarged prostate without lower urinary tract symptoms: Secondary | ICD-10-CM | POA: Diagnosis not present

## 2019-03-18 DIAGNOSIS — Z202 Contact with and (suspected) exposure to infections with a predominantly sexual mode of transmission: Secondary | ICD-10-CM | POA: Diagnosis not present

## 2019-03-18 DIAGNOSIS — I10 Essential (primary) hypertension: Secondary | ICD-10-CM | POA: Diagnosis not present

## 2019-03-18 DIAGNOSIS — E669 Obesity, unspecified: Secondary | ICD-10-CM | POA: Diagnosis not present

## 2019-03-18 DIAGNOSIS — N182 Chronic kidney disease, stage 2 (mild): Secondary | ICD-10-CM | POA: Diagnosis not present

## 2019-03-18 DIAGNOSIS — R3121 Asymptomatic microscopic hematuria: Secondary | ICD-10-CM | POA: Diagnosis not present

## 2019-03-18 DIAGNOSIS — N400 Enlarged prostate without lower urinary tract symptoms: Secondary | ICD-10-CM | POA: Diagnosis not present

## 2019-03-18 DIAGNOSIS — G629 Polyneuropathy, unspecified: Secondary | ICD-10-CM | POA: Diagnosis not present

## 2019-03-18 DIAGNOSIS — Z720 Tobacco use: Secondary | ICD-10-CM | POA: Diagnosis not present

## 2019-03-18 DIAGNOSIS — E559 Vitamin D deficiency, unspecified: Secondary | ICD-10-CM | POA: Diagnosis not present

## 2019-03-18 DIAGNOSIS — R7303 Prediabetes: Secondary | ICD-10-CM | POA: Diagnosis not present

## 2019-03-18 DIAGNOSIS — E7800 Pure hypercholesterolemia, unspecified: Secondary | ICD-10-CM | POA: Diagnosis not present

## 2019-07-24 DIAGNOSIS — E669 Obesity, unspecified: Secondary | ICD-10-CM | POA: Diagnosis not present

## 2019-07-24 DIAGNOSIS — G629 Polyneuropathy, unspecified: Secondary | ICD-10-CM | POA: Diagnosis not present

## 2019-07-24 DIAGNOSIS — R7303 Prediabetes: Secondary | ICD-10-CM | POA: Diagnosis not present

## 2019-07-24 DIAGNOSIS — Z720 Tobacco use: Secondary | ICD-10-CM | POA: Diagnosis not present

## 2019-07-24 DIAGNOSIS — N182 Chronic kidney disease, stage 2 (mild): Secondary | ICD-10-CM | POA: Diagnosis not present

## 2019-07-24 DIAGNOSIS — N400 Enlarged prostate without lower urinary tract symptoms: Secondary | ICD-10-CM | POA: Diagnosis not present

## 2019-07-24 DIAGNOSIS — E559 Vitamin D deficiency, unspecified: Secondary | ICD-10-CM | POA: Diagnosis not present

## 2019-07-24 DIAGNOSIS — I10 Essential (primary) hypertension: Secondary | ICD-10-CM | POA: Diagnosis not present

## 2019-07-24 DIAGNOSIS — H918X3 Other specified hearing loss, bilateral: Secondary | ICD-10-CM | POA: Diagnosis not present

## 2019-07-24 DIAGNOSIS — E7800 Pure hypercholesterolemia, unspecified: Secondary | ICD-10-CM | POA: Diagnosis not present

## 2019-12-08 ENCOUNTER — Ambulatory Visit: Payer: Medicare Other | Attending: Critical Care Medicine

## 2019-12-08 DIAGNOSIS — Z23 Encounter for immunization: Secondary | ICD-10-CM

## 2019-12-08 NOTE — Progress Notes (Signed)
   Covid-19 Vaccination Clinic  Name:  Seth Santana    MRN: [id number] DOB: [birth year]  12/08/2019  [name] was observed post Covid-19 immunization for 15 minutes without incident. He was provided with Vaccine Information Sheet and instruction to access the V-Safe system.   [name] was instructed to call 911 with any severe reactions post vaccine: Marland Kitchen Difficulty breathing  . Swelling of face and throat  . A fast heartbeat  . A bad rash all over body  . Dizziness and weakness   Immunizations Administered    Name Date Dose VIS Date Route   Moderna COVID-19 Vaccine 12/08/2019  1:33 PM 0.5 mL 03/2019 Intramuscular   Manufacturer: Moderna   Lot: [id number]   [place]: [id number]

## 2020-01-05 ENCOUNTER — Ambulatory Visit: Payer: Medicare Other

## 2020-01-06 ENCOUNTER — Ambulatory Visit: Payer: Medicare Other | Attending: Internal Medicine

## 2020-01-06 ENCOUNTER — Other Ambulatory Visit: Payer: Self-pay

## 2020-01-06 DIAGNOSIS — Z23 Encounter for immunization: Secondary | ICD-10-CM

## 2020-01-06 NOTE — Progress Notes (Signed)
   Covid-19 Vaccination Clinic  Name:  OBED SAMEK    MRN: [id number] DOB: 06-Sep-1950  01/06/2020  Mr. Atallah was observed post Covid-19 immunization for 15 minutes without incident. He was provided with Vaccine Information Sheet and instruction to access the V-Safe system.   Mr. Manninen was instructed to call 911 with any severe reactions post vaccine: Marland Kitchen Difficulty breathing  . Swelling of face and throat  . A fast heartbeat  . A bad rash all over body  . Dizziness and weakness   Immunizations Administered    Name Date Dose VIS Date Route   Moderna COVID-19 Vaccine 01/06/2020  9:59 AM 0.5 mL 03/2019 Intramuscular   Manufacturer: Moderna   Lot: [id number]   [place]: [id number]

## 2020-06-09 ENCOUNTER — Ambulatory Visit: Payer: Medicare Other | Attending: Critical Care Medicine

## 2020-06-09 ENCOUNTER — Other Ambulatory Visit: Payer: Self-pay

## 2020-06-30 ENCOUNTER — Ambulatory Visit: Payer: Medicare Other

## 2020-07-07 ENCOUNTER — Ambulatory Visit: Payer: Medicare Other

## 2020-10-13 ENCOUNTER — Ambulatory Visit: Payer: Medicare Other | Admitting: Family Medicine

## 2021-11-22 DIAGNOSIS — H903 Sensorineural hearing loss, bilateral: Secondary | ICD-10-CM | POA: Insufficient documentation

## 2021-11-30 ENCOUNTER — Encounter: Payer: Self-pay | Admitting: Podiatry

## 2021-11-30 ENCOUNTER — Ambulatory Visit (INDEPENDENT_AMBULATORY_CARE_PROVIDER_SITE_OTHER): Payer: Medicare HMO | Admitting: Podiatry

## 2021-11-30 DIAGNOSIS — R600 Localized edema: Secondary | ICD-10-CM

## 2021-11-30 DIAGNOSIS — B351 Tinea unguium: Secondary | ICD-10-CM

## 2021-11-30 DIAGNOSIS — M79674 Pain in right toe(s): Secondary | ICD-10-CM | POA: Diagnosis not present

## 2021-11-30 DIAGNOSIS — M79675 Pain in left toe(s): Secondary | ICD-10-CM | POA: Diagnosis not present

## 2021-11-30 NOTE — Progress Notes (Signed)
  Subjective:  Patient ID: Seth Santana, male    DOB: Aug 29, 1950,   MRN: [id number]  Chief Complaint  Patient presents with   Nail Problem     Routine foot care    71 y.o. male presents for concern of thickened elongated and painful nails that are difficult to trim. Requesting to have them trimmed today.  Relates swelling and pain in his feet. Takes gabapentin. He is not diabetic and not on any blood thinners as far as we can tell from chart. Last A1c was 5.3 but 5 years ago.   PCP:  Pcp, No    . Denies any other pedal complaints. Denies n/v/f/c.   Past Medical History:  Diagnosis Date   Blood in stool last few years   BPH (benign prostatic hyperplasia)    Hard of hearing    Headache(784.0)     Objective:  Physical Exam: Vascular: DP/PT pulses 2/4 bilateral. CFT <3 seconds. Normal hair growth on digits. No edema.  Skin. No lacerations or abrasions bilateral feet. Nails 1-5 b/l are thickened and elongated and discolored.  Musculoskeletal: MMT 5/5 bilateral lower extremities in DF, PF, Inversion and Eversion. Deceased ROM in DF of ankle joint.  Neurological: Sensation intact to light touch.   Assessment:   1. Pain due to onychomycosis of toenails of both feet   2. Localized edema      Plan:  Patient was evaluated and treated and all questions answered. -Discussed and educated patient on foot care, especially with  regards to the vascular, neurological and musculoskeletal systems.  -Discussed supportive shoes at all times and checking feet regularly.  -Mechanically debrided all nails 1-5 bilateral using sterile nail nipper and filed with dremel without incident  -Answered all patient questions -Discussed compression.  -Patient to return  in 3 months for at risk foot care -Patient advised to call the office if any problems or questions arise in the meantime.   Lorenda Peck, DPM

## 2021-12-06 ENCOUNTER — Ambulatory Visit: Payer: Medicare HMO | Admitting: Podiatry

## 2021-12-28 ENCOUNTER — Other Ambulatory Visit: Payer: Self-pay | Admitting: Family Medicine

## 2021-12-29 ENCOUNTER — Other Ambulatory Visit: Payer: Self-pay | Admitting: Infectious Diseases

## 2021-12-29 DIAGNOSIS — R609 Edema, unspecified: Secondary | ICD-10-CM

## 2021-12-30 ENCOUNTER — Ambulatory Visit
Admission: RE | Admit: 2021-12-30 | Discharge: 2021-12-30 | Disposition: A | Discharge status: Home / Self Care | Payer: Medicare Other | Source: Ambulatory Visit | Attending: Infectious Diseases | Admitting: Infectious Diseases

## 2021-12-30 DIAGNOSIS — R609 Edema, unspecified: Secondary | ICD-10-CM

## 2022-02-16 ENCOUNTER — Encounter (HOSPITAL_COMMUNITY): Payer: Self-pay | Admitting: Emergency Medicine

## 2022-02-16 ENCOUNTER — Ambulatory Visit (HOSPITAL_COMMUNITY)
Admission: EM | Admit: 2022-02-16 | Discharge: 2022-02-16 | Disposition: A | Discharge status: Home / Self Care | Payer: Medicare Other | Attending: Family Medicine | Admitting: Family Medicine

## 2022-02-16 DIAGNOSIS — R600 Localized edema: Secondary | ICD-10-CM | POA: Diagnosis present

## 2022-02-16 DIAGNOSIS — R197 Diarrhea, unspecified: Secondary | ICD-10-CM | POA: Diagnosis present

## 2022-02-16 LAB — COMPREHENSIVE METABOLIC PANEL
ALT: 20 U/L (ref 0–44)
AST: 34 U/L (ref 15–41)
Albumin: 3.8 g/dL (ref 3.5–5.0)
Alkaline Phosphatase: 56 U/L (ref 38–126)
Anion gap: 8 (ref 5–15)
BUN: 19 mg/dL (ref 8–23)
CO2: 21 mmol/L — ABNORMAL LOW (ref 22–32)
Calcium: 9.8 mg/dL (ref 8.9–10.3)
Chloride: 111 mmol/L (ref 98–111)
Creatinine, Ser: 2.14 mg/dL — ABNORMAL HIGH (ref 0.61–1.24)
GFR, Estimated: 33 mL/min — ABNORMAL LOW (ref >60)
Glucose, Bld: 94 mg/dL (ref 70–99)
Potassium: 4.5 mmol/L (ref 3.5–5.1)
Sodium: 140 mmol/L (ref 135–145)
Total Bilirubin: 0.7 mg/dL (ref 0.3–1.2)
Total Protein: 7.6 g/dL (ref 6.5–8.1)

## 2022-02-16 LAB — CBC
HCT: 27.7 % — ABNORMAL LOW (ref 39.0–52.0)
Hemoglobin: 9.1 g/dL — ABNORMAL LOW (ref 13.0–17.0)
MCH: 29.8 pg (ref 26.0–34.0)
MCHC: 32.9 g/dL (ref 30.0–36.0)
MCV: 90.8 fL (ref 80.0–100.0)
Platelets: 261 10*3/uL (ref 150–400)
RBC: 3.05 MIL/uL — ABNORMAL LOW (ref 4.22–5.81)
RDW: 14.2 % (ref 11.5–15.5)
WBC: 5 10*3/uL (ref 4.0–10.5)
nRBC: 0 % (ref 0.0–0.2)

## 2022-02-16 LAB — TSH: TSH: 4.159 u[IU]/mL (ref 0.350–4.500)

## 2022-02-16 LAB — BRAIN NATRIURETIC PEPTIDE: B Natriuretic Peptide: 35.1 pg/mL (ref 0.0–100.0)

## 2022-02-16 NOTE — ED Triage Notes (Signed)
Pt has issues with bilat leg swelling and peeling. C/o pain from knees to feet. Takes Furosemide. Scratched LLE last week or two. Put antibiotics ointment and bandage on it.  Past 2-3 days having diarrhea and decreased appetite. Taking Pepto Bismol over past day.   Patient itching all over.

## 2022-02-16 NOTE — Discharge Instructions (Signed)
Swelling happens when fluid collects in small spaces around tissues and organs inside the body. Another word for swelling is "edema." Some common parts of the body where people can have swelling are the lower legs or hands. This typically is worse in the areas of the body that are closest to the ground (because of gravity)  Symptoms of swelling can include puffiness of the skin, which can cause the skin to look stretched and shiny. This often occurs with swelling in the lower legs and can be worse after you sit or stand for a long time.  Treatment of edema includes several components: treatment of the underlying cause (if possible), reducing the amount of salt (sodium) in your diet, and, in many cases, use of a medication called a diuretic to eliminate excess fluid. Using compression stockings and elevating the legs may also be recommended.   You have had labs (blood work) sent today. We will call you with any significant abnormalities or if there is need to begin or change treatment or pursue further follow up.  You may also review your test results online through [facility]. If you do not have a MyChart account, instructions to sign up should be on your discharge paperwork.  Stop taking your amlodipine. This may be contributing to your leg swelling.

## 2022-02-21 NOTE — ED Provider Notes (Signed)
[place]   [id number] 02/16/22 Arrival Time: 1909  ASSESSMENT & PLAN:  1. Bilateral lower extremity edema   2. Diarrhea, unspecified type    Does not appear dehydrated. Will have him stop taking amlodipine as this could be contributing to LE edema.  Labs Reviewed  COMPREHENSIVE METABOLIC PANEL - Abnormal; Notable for the following components:      Result Value   CO2 21 (*)    Creatinine, Ser 2.14 (*)    GFR, Estimated 33 (*)    All other components within normal limits  CBC - Abnormal; Notable for the following components:   RBC 3.05 (*)    Hemoglobin 9.1 (*)    HCT 27.7 (*)    All other components within normal limits  TSH  BRAIN NATRIURETIC PEPTIDE   Has Lasix at home. Will adjust his Rx dose to 40 mg QD from 20 mg QD.  Elevated Cr address with pt call from RN. Does need to recheck this within a few days. Encourage fluid intake. Is also anemic.    Discharge Instructions      Swelling happens when fluid collects in small spaces around tissues and organs inside the body. Another word for swelling is "edema." Some common parts of the body where people can have swelling are the lower legs or hands. This typically is worse in the areas of the body that are closest to the ground (because of gravity)  Symptoms of swelling can include puffiness of the skin, which can cause the skin to look stretched and shiny. This often occurs with swelling in the lower legs and can be worse after you sit or stand for a long time.  Treatment of edema includes several components: treatment of the underlying cause (if possible), reducing the amount of salt (sodium) in your diet, and, in many cases, use of a medication called a diuretic to eliminate excess fluid. Using compression stockings and elevating the legs may also be recommended.   You have had labs (blood work) sent today. We will call you with any significant abnormalities or if there is need to begin or change treatment or  pursue further follow up.  You may also review your test results online through [facility]. If you do not have a MyChart account, instructions to sign up should be on your discharge paperwork.  Stop taking your amlodipine. This may be contributing to your leg swelling.      Discussed typical duration of symptoms for suspected viral GI illness. Will do his best to ensure adequate fluid intake in order to avoid dehydration. Will proceed to the Emergency Department for evaluation if unable to tolerate PO fluids regularly.  Otherwise he will f/u with his PCP or here if not showing improvement over the next 48-72 hours.  Reviewed expectations re: course of current medical issues. Questions answered. Outlined signs and symptoms indicating need for more acute intervention. Patient verbalized understanding. After Visit Summary given.   SUBJECTIVE: History from: patient and family.  Seth Santana is a 71 y.o. male who reports issues with bilat leg swelling and peeling; apparently long-standing; legs are painful when swelling is worse. Reports recent U/S negative for DVT. No recent illnesses. Has had mild non-bloody diarrhea lately; x 48 hours; not worsening. Feels he is drinking enough water. No abd pain. No CP/SOB. No new medications. Also reports generalized itching; unsure of duration.   Past Surgical History:  Procedure Laterality Date   COLONOSCOPY WITH PROPOFOL N/A 10/09/2013   Procedure:  COLONOSCOPY WITH PROPOFOL;  Surgeon: Garlan Fair, MD;  Location: [facility];  Service: Endoscopy;  Laterality: N/A;   NO PAST SURGERIES       OBJECTIVE:  Vitals:   02/16/22 1959  BP: (!) 146/83  Pulse: 75  Resp: 18  Temp: 99.1 F (37.3 C)  TempSrc: Oral  SpO2: 100%    General appearance: alert; no distress Oropharynx: moist Lungs: clear to auscultation bilaterally; unlabored Heart: regular rate and rhythm Abdomen: soft; non-distended; no significant abdominal tenderness; no  masses or organomegaly; no guarding or rebound tenderness Back: no CVA tenderness Extremities: bilateral LE edema; symmetrical with no gross deformities Skin: warm; dry Neurologic: normal gait Psychological: alert and cooperative; normal mood and affect  Labs: Results for orders placed or performed during the hospital encounter of 02/16/22  Comprehensive metabolic panel  Result Value Ref Range   Sodium 140 135 - 145 mmol/L   Potassium 4.5 3.5 - 5.1 mmol/L   Chloride 111 98 - 111 mmol/L   CO2 21 (L) 22 - 32 mmol/L   Glucose, Bld 94 70 - 99 mg/dL   BUN 19 8 - 23 mg/dL   Creatinine, Ser 2.14 (H) 0.61 - 1.24 mg/dL   Calcium 9.8 8.9 - 10.3 mg/dL   Total Protein 7.6 6.5 - 8.1 g/dL   Albumin 3.8 3.5 - 5.0 g/dL   AST 34 15 - 41 U/L   ALT 20 0 - 44 U/L   Alkaline Phosphatase 56 38 - 126 U/L   Total Bilirubin 0.7 0.3 - 1.2 mg/dL   GFR, Estimated 33 (L) >60 mL/min   Anion gap 8 5 - 15  CBC  Result Value Ref Range   WBC 5.0 4.0 - 10.5 K/uL   RBC 3.05 (L) 4.22 - 5.81 MIL/uL   Hemoglobin 9.1 (L) 13.0 - 17.0 g/dL   HCT 27.7 (L) 39.0 - 52.0 %   MCV 90.8 80.0 - 100.0 fL   MCH 29.8 26.0 - 34.0 pg   MCHC 32.9 30.0 - 36.0 g/dL   RDW 14.2 11.5 - 15.5 %   Platelets 261 150 - 400 K/uL   nRBC 0.0 0.0 - 0.2 %  TSH  Result Value Ref Range   TSH 4.159 0.350 - 4.500 uIU/mL  Brain natriuretic peptide  Result Value Ref Range   B Natriuretic Peptide 35.1 0.0 - 100.0 pg/mL   Labs Reviewed  COMPREHENSIVE METABOLIC PANEL - Abnormal; Notable for the following components:      Result Value   CO2 21 (*)    Creatinine, Ser 2.14 (*)    GFR, Estimated 33 (*)    All other components within normal limits  CBC - Abnormal; Notable for the following components:   RBC 3.05 (*)    Hemoglobin 9.1 (*)    HCT 27.7 (*)    All other components within normal limits  TSH  BRAIN NATRIURETIC PEPTIDE     No Known Allergies                                             Past Medical History:  Diagnosis Date    Blood in stool last few years   BPH (benign prostatic hyperplasia)    Hard of hearing    Headache(784.0)    Social History   Socioeconomic History   Marital status: Single    Spouse name: Not on  file   Number of children: Not on file   Years of education: Not on file   Highest education level: Not on file  Occupational History   Not on file  Tobacco Use   Smoking status: Every Day    Packs/day: 0.25    Years: 30.00    Total pack years: 7.50    Types: Cigarettes   Smokeless tobacco: Never  Substance and Sexual Activity   Alcohol use: Yes    Comment: beer 2 cans per day   Drug use: No   Sexual activity: Not on file  Other Topics Concern   Not on file  Social History Narrative   Not on file   Social Determinants of Health   Financial Resource Strain: Not on file  Food Insecurity: Not on file  Transportation Needs: Not on file  Physical Activity: Not on file  Stress: Not on file  Social Connections: Not on file  Intimate Partner Violence: Not on file   Family History  Problem Relation Age of Onset   Kidney disease Mother    Diabetes Mother    Cancer Brother       Vanessa Kick, MD 02/21/22 [phone]

## 2022-02-28 ENCOUNTER — Encounter: Payer: Self-pay | Admitting: Podiatry

## 2022-02-28 ENCOUNTER — Ambulatory Visit (INDEPENDENT_AMBULATORY_CARE_PROVIDER_SITE_OTHER): Payer: Medicare Other | Admitting: Podiatry

## 2022-02-28 DIAGNOSIS — G629 Polyneuropathy, unspecified: Secondary | ICD-10-CM

## 2022-02-28 DIAGNOSIS — E119 Type 2 diabetes mellitus without complications: Secondary | ICD-10-CM

## 2022-02-28 DIAGNOSIS — M79674 Pain in right toe(s): Secondary | ICD-10-CM | POA: Diagnosis not present

## 2022-02-28 DIAGNOSIS — L84 Corns and callosities: Secondary | ICD-10-CM | POA: Diagnosis not present

## 2022-02-28 DIAGNOSIS — M79675 Pain in left toe(s): Secondary | ICD-10-CM

## 2022-02-28 DIAGNOSIS — B351 Tinea unguium: Secondary | ICD-10-CM | POA: Diagnosis not present

## 2022-03-01 ENCOUNTER — Other Ambulatory Visit: Payer: Self-pay | Admitting: Urology

## 2022-03-04 NOTE — Progress Notes (Signed)
ANNUAL DIABETIC FOOT EXAM  Subjective: Seth Santana presents today for annual diabetic foot examination.  Chief Complaint  Patient presents with   foot care    Patient is here for routine foot care, patient is not diabetic, last seen primary care doctor  today 02/28/22.  Patient confirms h/o prediabetes.  Patient denies any h/o foot wounds.  Patient has been diagnosed with neuropathy and it is managed with gabapentin.  Risk factors: neuropathy, HTN, hyperlipidemia, current tobacco user.  Seth Clan, Seth Santana is patient's PCP.   Past Medical History:  Diagnosis Date   Blood in stool last few years   BPH (benign prostatic hyperplasia)    Hard of hearing    Headache(784.0)    Patient Active Problem List   Diagnosis Date Noted   Sensorineural hearing loss (SNHL) of both ears 11/22/2021   Closed fracture of tripod ([place]) 02/18/2013   Past Surgical History:  Procedure Laterality Date   COLONOSCOPY WITH PROPOFOL N/A 10/09/2013   Procedure: COLONOSCOPY WITH PROPOFOL;  Surgeon: Garlan Fair, MD;  Location: [facility];  Service: Endoscopy;  Laterality: N/A;   NO PAST SURGERIES     Current Outpatient Medications on File Prior to Visit  Medication Sig Dispense Refill   amLODipine-valsartan (EXFORGE) 5-320 MG tablet Take 1 tablet by mouth daily.     ciprofloxacin (CIPRO) 500 MG tablet Take 500 mg by mouth 2 (two) times daily.     cyclobenzaprine (FLEXERIL) 10 MG tablet Take 10 mg by mouth 3 (three) times daily as needed for muscle spasms.     finasteride (PROSCAR) 5 MG tablet Take 5 mg by mouth daily.     gabapentin (NEURONTIN) 300 MG capsule Take 300 mg by mouth 3 (three) times daily.     gemfibrozil (LOPID) 600 MG tablet Take 600 mg by mouth 2 (two) times daily before a meal.     hydrOXYzine (ATARAX) 10 MG tablet Take 10 mg by mouth every 8 (eight) hours as needed for itching.     LASIX 20 MG tablet Take 20 mg by mouth daily.     omeprazole (PRILOSEC) 40 MG capsule Take 40  mg by mouth daily.     rosuvastatin (CRESTOR) 5 MG tablet Take 5 mg by mouth daily.     traMADol (ULTRAM) 50 MG tablet Take 1 tablet (50 mg total) by mouth every 12 (twelve) hours as needed for moderate pain. 30 tablet 0   triamcinolone ointment (KENALOG) 0.1 % Apply 1 Application topically 2 (two) times daily.     No current facility-administered medications on file prior to visit.    No Known Allergies Social History   Occupational History   Not on file  Tobacco Use   Smoking status: Every Day    Packs/day: 0.25    Years: 30.00    Total pack years: 7.50    Types: Cigarettes   Smokeless tobacco: Never  Substance and Sexual Activity   Alcohol use: Yes    Comment: beer 2 cans per day   Drug use: No   Sexual activity: Not on file   Family History  Problem Relation Age of Onset   Kidney disease Mother    Diabetes Mother    Cancer Brother    Immunization History  Administered Date(s) Administered   [employer] Vaccination 12/08/2019, 01/06/2020   Tdap 02/08/2013     Review of Systems: Negative except as noted in the HPI.   Objective: There were no vitals filed for this visit.  Seth Santana is a pleasant 71 y.o. male in NAD. AAO X 3.  Vascular Examination: CFT <3 seconds b/l. DP/PT pulses faintly palpable b/l. Skin temperature gradient warm to warm b/l. No pain with calf compression. No ischemia or gangrene. No cyanosis or clubbing noted b/l.    Neurological Examination: Pt has subjective symptoms of neuropathy. Proprioception intact bilaterally. Sensation grossly intact b/l with 10 gram monofilament. Vibratory sensation intact b/l.   Dermatological Examination: Pedal skin warm and supple b/l. Toenails 1-5 b/l thick, discolored, elongated with subungual debris and pain on dorsal palpation.  Hyperkeratotic lesion(s) medial IPJ of right great toe.  No erythema, no edema, no drainage, no fluctuance.  Musculoskeletal Examination: Muscle strength 5/5 to b/l  LE. No pain, crepitus or joint limitation noted with ROM bilateral LE. No gross bony deformities bilaterally.  Radiographs: None  Footwear Assessment: Does the patient wear appropriate shoes? Yes. Does the patient need inserts/orthotics? No.  ADA Risk Categorization: Low Risk :  Patient has all of the following: Intact protective sensation No prior foot ulcer  No severe deformity Pedal pulses present  Assessment: 1. Pain due to onychomycosis of toenails of both feet   2. Callus   3. Polyneuropathy   4. Controlled type 2 diabetes mellitus without complication, without long-term current use of insulin ([place])   5. Encounter for diabetic foot exam [facility])     Plan: -Patient was evaluated and treated. All patient's and/or POA's questions/concerns answered on today's visit. -Diabetic foot examination performed today. -Toenails 1-5 b/l were debrided in length and girth with sterile nail nippers without iatrogenic bleeding. Patient declined use of dremel. -Callus(es) R hallux pared utilizing sterile scalpel blade without complication or incident. Total number debrided =1. -Patient/POA to call should there be question/concern in the interim. Return in about 3 months (around 05/31/2022).  Marzetta Board, DPM

## 2022-03-06 ENCOUNTER — Ambulatory Visit: Payer: Medicare Other | Admitting: Internal Medicine

## 2022-03-10 NOTE — Patient Instructions (Signed)
SURGICAL WAITING ROOM VISITATION Patients having surgery or a procedure may have no more than 2 support people in the waiting area - these visitors may rotate.   Children under the age of 48 must have an adult with them who is not the patient. If the patient needs to stay at the hospital during part of their recovery, the visitor guidelines for inpatient rooms apply. Pre-op nurse will coordinate an appropriate time for 1 support person to accompany patient in pre-op.  This support person may not rotate.    Please refer to the [facility] website for the visitor guidelines for Inpatients (after your surgery is over and you are in a regular room).     Your procedure is scheduled on: 03/15/22   Report to [facility] Main Entrance    Report to admitting at 10:00 AM   Call this number if you have problems the morning of surgery [phone]   Do not eat food or drink liquids :After Midnight.          If you have questions, please contact your surgeon's office.   FOLLOW BOWEL PREP AND ANY ADDITIONAL PRE OP INSTRUCTIONS YOU RECEIVED FROM YOUR SURGEON'S OFFICE!!!     Oral Hygiene is also important to reduce your risk of infection.                                    Remember - BRUSH YOUR TEETH THE MORNING OF SURGERY WITH YOUR REGULAR TOOTHPASTE  DENTURES WILL BE REMOVED PRIOR TO SURGERY PLEASE DO NOT APPLY "Poly grip" OR ADHESIVES!!!   Take these medicines the morning of surgery with A SIP OF WATER: Tylenol, Finasteride, Gabapentin, Omeprazole, Rosuvastatin                               You may not have any metal on your body including jewelry, and body piercing             Do not wear lotions, powders, cologne, or deodorant              Men may shave face and neck.   Do not bring valuables to the hospital. [place].   Contacts, glasses, dentures or bridgework may not be worn into surgery.  DO NOT [place]. PHARMACY WILL DISPENSE MEDICATIONS LISTED ON YOUR MEDICATION LIST TO YOU DURING YOUR ADMISSION [place]!    Patients discharged on the day of surgery will not be allowed to drive home.  Someone NEEDS to stay with you for the first 24 hours after anesthesia.              Please read over the following fact sheets you were given: IF [place] [phone] Santana    If you received a COVID test during your pre-op visit  it is requested that you wear a mask when out in public, stay away from anyone that may not be feeling well and notify your surgeon if you develop symptoms. If you test positive for Covid or have been in contact with anyone that has tested positive in the last 10 days please notify you surgeon.    [place] - Preparing  for Surgery Before surgery, you can play an important role.  Because skin is not sterile, your skin needs to be as free of germs as possible.  You can reduce the number of germs on your skin by washing with CHG (chlorahexidine gluconate) soap before surgery.  CHG is an antiseptic cleaner which kills germs and bonds with the skin to continue killing germs even after washing. Please DO NOT use if you have an allergy to CHG or antibacterial soaps.  If your skin becomes reddened/irritated stop using the CHG and inform your nurse when you arrive at Short Stay. Do not shave (including legs and underarms) for at least 48 hours prior to the first CHG shower.  You may shave your face/neck.  Please follow these instructions carefully:  1.  Shower with CHG Soap the night before surgery and the  morning of surgery.  2.  If you choose to wash your hair, wash your hair first as usual with your normal  shampoo.  3.  After you shampoo, rinse your hair and body thoroughly to remove the shampoo.                             4.  Use CHG as you would any other liquid soap.  You can apply chg directly to the skin and  wash.  Gently with a scrungie or clean washcloth.  5.  Apply the CHG Soap to your body ONLY FROM THE NECK DOWN.   Do   not use on face/ open                           Wound or open sores. Avoid contact with eyes, ears mouth and   genitals (private parts).                       Wash face,  Genitals (private parts) with your normal soap.             6.  Wash thoroughly, paying special attention to the area where your    surgery  will be performed.  7.  Thoroughly rinse your body with warm water from the neck down.  8.  DO NOT shower/wash with your normal soap after using and rinsing off the CHG Soap.                9.  Pat yourself dry with a clean towel.            10.  Wear clean pajamas.            11.  Place clean sheets on your bed the night of your first shower and do not  sleep with pets. Day of Surgery : Do not apply any lotions/deodorants the morning of surgery.  Please wear clean clothes to the hospital/surgery center.  FAILURE TO FOLLOW THESE INSTRUCTIONS MAY RESULT IN THE CANCELLATION OF YOUR SURGERY  PATIENT SIGNATURE_________________________________  NURSE SIGNATURE__________________________________  ________________________________________________________________________

## 2022-03-10 NOTE — Progress Notes (Addendum)
Patient is very hard of hearing even with hearing aids in  COVID Vaccine Completed: yes  Date of COVID positive in last 90 days: no  PCP - Dr. Claiborne Billings Cardiologist - n/a  Chest x-ray - n/a EKG - 03/13/22 Epic/chart Stress Test - n/a ECHO - n/a Cardiac Cath - n/a Pacemaker/ICD device last checked: n/a Spinal Cord Stimulator: n/a  Bowel Prep - no  Sleep Study - n/a CPAP -   Fasting Blood Sugar - n/a Checks Blood Sugar _____ times a day  Last dose of GLP1 agonist-  N/A GLP1 instructions:  N/A   Last dose of SGLT-2 inhibitors-  N/A SGLT-2 instructions: N/A   Blood Thinner Instructions: n/a Aspirin Instructions: Last Dose:  Activity level: Can go up a flight of stairs and perform activities of daily living without stopping and without symptoms of chest pain or shortness of breath. 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 Patient was also instructed that they will need to review over the PAT instructions again at home before surgery.

## 2022-03-13 ENCOUNTER — Encounter (HOSPITAL_COMMUNITY)
Admission: RE | Admit: 2022-03-13 | Discharge: 2022-03-13 | Disposition: A | Discharge status: Home / Self Care | Payer: Medicare Other | Source: Ambulatory Visit | Attending: Urology | Admitting: Urology

## 2022-03-13 ENCOUNTER — Encounter (HOSPITAL_COMMUNITY): Payer: Self-pay

## 2022-03-13 VITALS — BP 176/83 | HR 76 | Temp 98.6°F | Resp 14 | Ht 69.0 in | Wt 195.0 lb

## 2022-03-13 DIAGNOSIS — Z841 Family history of disorders of kidney and ureter: Secondary | ICD-10-CM | POA: Diagnosis not present

## 2022-03-13 DIAGNOSIS — I2510 Atherosclerotic heart disease of native coronary artery without angina pectoris: Secondary | ICD-10-CM

## 2022-03-13 DIAGNOSIS — Z23 Encounter for immunization: Secondary | ICD-10-CM | POA: Diagnosis not present

## 2022-03-13 DIAGNOSIS — F17290 Nicotine dependence, other tobacco product, uncomplicated: Secondary | ICD-10-CM | POA: Diagnosis not present

## 2022-03-13 DIAGNOSIS — I160 Hypertensive urgency: Secondary | ICD-10-CM | POA: Diagnosis not present

## 2022-03-13 DIAGNOSIS — J9601 Acute respiratory failure with hypoxia: Secondary | ICD-10-CM | POA: Diagnosis not present

## 2022-03-13 DIAGNOSIS — I130 Hypertensive heart and chronic kidney disease with heart failure and stage 1 through stage 4 chronic kidney disease, or unspecified chronic kidney disease: Secondary | ICD-10-CM | POA: Diagnosis not present

## 2022-03-13 DIAGNOSIS — N451 Epididymitis: Secondary | ICD-10-CM | POA: Diagnosis not present

## 2022-03-13 DIAGNOSIS — Z833 Family history of diabetes mellitus: Secondary | ICD-10-CM | POA: Diagnosis not present

## 2022-03-13 DIAGNOSIS — Z79899 Other long term (current) drug therapy: Secondary | ICD-10-CM | POA: Diagnosis not present

## 2022-03-13 DIAGNOSIS — I959 Hypotension, unspecified: Secondary | ICD-10-CM | POA: Diagnosis not present

## 2022-03-13 DIAGNOSIS — I5031 Acute diastolic (congestive) heart failure: Secondary | ICD-10-CM | POA: Diagnosis not present

## 2022-03-13 DIAGNOSIS — Z01818 Encounter for other preprocedural examination: Secondary | ICD-10-CM | POA: Insufficient documentation

## 2022-03-13 DIAGNOSIS — D638 Anemia in other chronic diseases classified elsewhere: Secondary | ICD-10-CM | POA: Diagnosis not present

## 2022-03-13 DIAGNOSIS — N452 Orchitis: Secondary | ICD-10-CM | POA: Diagnosis not present

## 2022-03-13 DIAGNOSIS — H903 Sensorineural hearing loss, bilateral: Secondary | ICD-10-CM | POA: Diagnosis not present

## 2022-03-13 DIAGNOSIS — N1832 Chronic kidney disease, stage 3b: Secondary | ICD-10-CM | POA: Diagnosis not present

## 2022-03-13 DIAGNOSIS — F17210 Nicotine dependence, cigarettes, uncomplicated: Secondary | ICD-10-CM | POA: Diagnosis not present

## 2022-03-13 DIAGNOSIS — K219 Gastro-esophageal reflux disease without esophagitis: Secondary | ICD-10-CM | POA: Diagnosis not present

## 2022-03-13 DIAGNOSIS — D62 Acute posthemorrhagic anemia: Secondary | ICD-10-CM | POA: Diagnosis not present

## 2022-03-13 HISTORY — DX: Essential (primary) hypertension: I10

## 2022-03-13 LAB — BASIC METABOLIC PANEL
Anion gap: 6 (ref 5–15)
BUN: 22 mg/dL (ref 8–23)
CO2: 24 mmol/L (ref 22–32)
Calcium: 9.6 mg/dL (ref 8.9–10.3)
Chloride: 110 mmol/L (ref 98–111)
Creatinine, Ser: 1.74 mg/dL — ABNORMAL HIGH (ref 0.61–1.24)
GFR, Estimated: 42 mL/min — ABNORMAL LOW (ref >60)
Glucose, Bld: 88 mg/dL (ref 70–99)
Potassium: 4.3 mmol/L (ref 3.5–5.1)
Sodium: 140 mmol/L (ref 135–145)

## 2022-03-13 LAB — CBC
HCT: 27.5 % — ABNORMAL LOW (ref 39.0–52.0)
Hemoglobin: 8.6 g/dL — ABNORMAL LOW (ref 13.0–17.0)
MCH: 29.2 pg (ref 26.0–34.0)
MCHC: 31.3 g/dL (ref 30.0–36.0)
MCV: 93.2 fL (ref 80.0–100.0)
Platelets: 259 10*3/uL (ref 150–400)
RBC: 2.95 MIL/uL — ABNORMAL LOW (ref 4.22–5.81)
RDW: 14.2 % (ref 11.5–15.5)
WBC: 6 10*3/uL (ref 4.0–10.5)
nRBC: 0 % (ref 0.0–0.2)

## 2022-03-14 NOTE — Progress Notes (Signed)
Hgb 8.6 and creatinine 1.74. results routed to Dr. Tresa Moore.

## 2022-03-15 ENCOUNTER — Encounter: Payer: Medicare Other | Admitting: Internal Medicine

## 2022-03-15 ENCOUNTER — Other Ambulatory Visit: Payer: Self-pay

## 2022-03-15 ENCOUNTER — Inpatient Hospital Stay (HOSPITAL_COMMUNITY)
Admission: RE | Admit: 2022-03-15 | Discharge: 2022-03-17 | DRG: 981 | Disposition: A | Discharge status: Home / Self Care | Payer: Medicare Other | Attending: Student | Admitting: Student

## 2022-03-15 ENCOUNTER — Ambulatory Visit (HOSPITAL_COMMUNITY): Payer: Medicare Other

## 2022-03-15 ENCOUNTER — Ambulatory Visit (HOSPITAL_BASED_OUTPATIENT_CLINIC_OR_DEPARTMENT_OTHER): Payer: Medicare Other | Admitting: Anesthesiology

## 2022-03-15 ENCOUNTER — Encounter (HOSPITAL_COMMUNITY): Payer: Self-pay | Admitting: Urology

## 2022-03-15 ENCOUNTER — Ambulatory Visit: Payer: Medicare Other | Admitting: Podiatry

## 2022-03-15 ENCOUNTER — Ambulatory Visit (HOSPITAL_COMMUNITY): Payer: Medicare Other | Admitting: Physician Assistant

## 2022-03-15 ENCOUNTER — Encounter (HOSPITAL_COMMUNITY)
Admission: RE | Disposition: A | Discharge status: Home / Self Care | Payer: Self-pay | Source: Home / Self Care | Attending: Student

## 2022-03-15 DIAGNOSIS — Z833 Family history of diabetes mellitus: Secondary | ICD-10-CM

## 2022-03-15 DIAGNOSIS — D649 Anemia, unspecified: Secondary | ICD-10-CM

## 2022-03-15 DIAGNOSIS — N400 Enlarged prostate without lower urinary tract symptoms: Secondary | ICD-10-CM | POA: Diagnosis present

## 2022-03-15 DIAGNOSIS — N401 Enlarged prostate with lower urinary tract symptoms: Secondary | ICD-10-CM

## 2022-03-15 DIAGNOSIS — F17210 Nicotine dependence, cigarettes, uncomplicated: Secondary | ICD-10-CM | POA: Diagnosis present

## 2022-03-15 DIAGNOSIS — L089 Local infection of the skin and subcutaneous tissue, unspecified: Secondary | ICD-10-CM | POA: Diagnosis not present

## 2022-03-15 DIAGNOSIS — F1290 Cannabis use, unspecified, uncomplicated: Secondary | ICD-10-CM | POA: Diagnosis present

## 2022-03-15 DIAGNOSIS — I959 Hypotension, unspecified: Secondary | ICD-10-CM | POA: Diagnosis present

## 2022-03-15 DIAGNOSIS — Z841 Family history of disorders of kidney and ureter: Secondary | ICD-10-CM

## 2022-03-15 DIAGNOSIS — K219 Gastro-esophageal reflux disease without esophagitis: Secondary | ICD-10-CM | POA: Diagnosis present

## 2022-03-15 DIAGNOSIS — N5089 Other specified disorders of the male genital organs: Secondary | ICD-10-CM | POA: Diagnosis not present

## 2022-03-15 DIAGNOSIS — F1010 Alcohol abuse, uncomplicated: Secondary | ICD-10-CM | POA: Diagnosis present

## 2022-03-15 DIAGNOSIS — Z79899 Other long term (current) drug therapy: Secondary | ICD-10-CM | POA: Diagnosis not present

## 2022-03-15 DIAGNOSIS — I130 Hypertensive heart and chronic kidney disease with heart failure and stage 1 through stage 4 chronic kidney disease, or unspecified chronic kidney disease: Secondary | ICD-10-CM | POA: Diagnosis present

## 2022-03-15 DIAGNOSIS — J811 Chronic pulmonary edema: Secondary | ICD-10-CM | POA: Diagnosis not present

## 2022-03-15 DIAGNOSIS — I5030 Unspecified diastolic (congestive) heart failure: Secondary | ICD-10-CM | POA: Insufficient documentation

## 2022-03-15 DIAGNOSIS — D638 Anemia in other chronic diseases classified elsewhere: Secondary | ICD-10-CM | POA: Diagnosis present

## 2022-03-15 DIAGNOSIS — N509 Disorder of male genital organs, unspecified: Secondary | ICD-10-CM | POA: Diagnosis present

## 2022-03-15 DIAGNOSIS — N451 Epididymitis: Secondary | ICD-10-CM | POA: Diagnosis present

## 2022-03-15 DIAGNOSIS — D62 Acute posthemorrhagic anemia: Secondary | ICD-10-CM | POA: Diagnosis present

## 2022-03-15 DIAGNOSIS — I5031 Acute diastolic (congestive) heart failure: Secondary | ICD-10-CM | POA: Diagnosis present

## 2022-03-15 DIAGNOSIS — Z01818 Encounter for other preprocedural examination: Secondary | ICD-10-CM | POA: Diagnosis not present

## 2022-03-15 DIAGNOSIS — H903 Sensorineural hearing loss, bilateral: Secondary | ICD-10-CM

## 2022-03-15 DIAGNOSIS — N1832 Chronic kidney disease, stage 3b: Secondary | ICD-10-CM | POA: Diagnosis not present

## 2022-03-15 DIAGNOSIS — N452 Orchitis: Secondary | ICD-10-CM | POA: Diagnosis present

## 2022-03-15 DIAGNOSIS — J9601 Acute respiratory failure with hypoxia: Secondary | ICD-10-CM | POA: Diagnosis not present

## 2022-03-15 DIAGNOSIS — I509 Heart failure, unspecified: Secondary | ICD-10-CM

## 2022-03-15 DIAGNOSIS — D509 Iron deficiency anemia, unspecified: Secondary | ICD-10-CM | POA: Diagnosis not present

## 2022-03-15 DIAGNOSIS — N492 Inflammatory disorders of scrotum: Secondary | ICD-10-CM | POA: Diagnosis present

## 2022-03-15 DIAGNOSIS — I10 Essential (primary) hypertension: Secondary | ICD-10-CM

## 2022-03-15 DIAGNOSIS — J90 Pleural effusion, not elsewhere classified: Secondary | ICD-10-CM | POA: Diagnosis not present

## 2022-03-15 DIAGNOSIS — Z23 Encounter for immunization: Secondary | ICD-10-CM

## 2022-03-15 DIAGNOSIS — F17290 Nicotine dependence, other tobacco product, uncomplicated: Secondary | ICD-10-CM | POA: Diagnosis present

## 2022-03-15 DIAGNOSIS — R0902 Hypoxemia: Secondary | ICD-10-CM | POA: Diagnosis not present

## 2022-03-15 DIAGNOSIS — J9600 Acute respiratory failure, unspecified whether with hypoxia or hypercapnia: Secondary | ICD-10-CM | POA: Diagnosis present

## 2022-03-15 DIAGNOSIS — I160 Hypertensive urgency: Secondary | ICD-10-CM | POA: Diagnosis present

## 2022-03-15 DIAGNOSIS — F17200 Nicotine dependence, unspecified, uncomplicated: Secondary | ICD-10-CM | POA: Diagnosis not present

## 2022-03-15 HISTORY — PX: ORCHIECTOMY: SHX2116

## 2022-03-15 LAB — BLOOD GAS, ARTERIAL
Acid-base deficit: 2.8 mmol/L — ABNORMAL HIGH (ref 0.0–2.0)
Bicarbonate: 21.9 mmol/L (ref 20.0–28.0)
Delivery systems: POSITIVE
Drawn by: 29503
Expiratory PAP: 6 cmH2O
FIO2: 70 %
Inspiratory PAP: 14 cmH2O
Mode: POSITIVE
O2 Saturation: 98.8 %
Patient temperature: 37
RATE: 14 resp/min
pCO2 arterial: 37 mmHg (ref 32–48)
pH, Arterial: 7.38 (ref 7.35–7.45)
pO2, Arterial: 114 mmHg — ABNORMAL HIGH (ref 83–108)

## 2022-03-15 LAB — BRAIN NATRIURETIC PEPTIDE: B Natriuretic Peptide: 278.6 pg/mL — ABNORMAL HIGH (ref 0.0–100.0)

## 2022-03-15 LAB — HIV ANTIBODY (ROUTINE TESTING W REFLEX): HIV Screen 4th Generation wRfx: NONREACTIVE

## 2022-03-15 LAB — TROPONIN I (HIGH SENSITIVITY): Troponin I (High Sensitivity): 8 ng/L (ref <18)

## 2022-03-15 LAB — MRSA NEXT GEN BY PCR, NASAL: MRSA by PCR Next Gen: NOT DETECTED

## 2022-03-15 SURGERY — ORCHIECTOMY
Anesthesia: General | Laterality: Right

## 2022-03-15 MED ORDER — BUPIVACAINE HCL (PF) 0.25 % IJ SOLN
INTRAMUSCULAR | Status: AC
  Filled 2022-03-15: qty 30

## 2022-03-15 MED ORDER — HYDRALAZINE HCL 20 MG/ML IJ SOLN
INTRAMUSCULAR | Status: AC
  Filled 2022-03-15: qty 1

## 2022-03-15 MED ORDER — BUPIVACAINE HCL (PF) 0.25 % IJ SOLN
INTRAMUSCULAR | Status: DC | PRN
  Administered 2022-03-15: 30 mL

## 2022-03-15 MED ORDER — OXYCODONE HCL 5 MG PO TABS: 5.0000 mg | ORAL_TABLET | Freq: Once | ORAL | Status: DC | PRN

## 2022-03-15 MED ORDER — FINASTERIDE 5 MG PO TABS
5.0000 mg | ORAL_TABLET | Freq: Every day | ORAL | Status: DC
  Administered 2022-03-16 – 2022-03-17 (×2): 5 mg via ORAL
  Filled 2022-03-15 (×2): qty 1

## 2022-03-15 MED ORDER — CEFAZOLIN SODIUM-DEXTROSE 2-4 GM/100ML-% IV SOLN
2.0000 g | INTRAVENOUS | Status: AC
  Administered 2022-03-15: 2 g via INTRAVENOUS
  Filled 2022-03-15: qty 100

## 2022-03-15 MED ORDER — SUGAMMADEX SODIUM 200 MG/2ML IV SOLN
INTRAVENOUS | Status: DC | PRN
  Administered 2022-03-15: 200 mg via INTRAVENOUS

## 2022-03-15 MED ORDER — LIDOCAINE 2% (20 MG/ML) 5 ML SYRINGE
INTRAMUSCULAR | Status: DC | PRN
  Administered 2022-03-15: 60 mg via INTRAVENOUS

## 2022-03-15 MED ORDER — OXYCODONE HCL 5 MG PO TABS: 5.0000 mg | ORAL_TABLET | Freq: Four times a day (QID) | ORAL | 0 refills | Status: DC | PRN

## 2022-03-15 MED ORDER — MIDAZOLAM HCL 2 MG/2ML IJ SOLN
INTRAMUSCULAR | Status: AC
  Filled 2022-03-15: qty 2

## 2022-03-15 MED ORDER — FENTANYL CITRATE PF 50 MCG/ML IJ SOSY
PREFILLED_SYRINGE | INTRAMUSCULAR | Status: AC
  Filled 2022-03-15: qty 3

## 2022-03-15 MED ORDER — SENNOSIDES-DOCUSATE SODIUM 8.6-50 MG PO TABS: 1.0000 | ORAL_TABLET | Freq: Two times a day (BID) | ORAL | 0 refills | Status: DC

## 2022-03-15 MED ORDER — ROCURONIUM BROMIDE 10 MG/ML (PF) SYRINGE
PREFILLED_SYRINGE | INTRAVENOUS | Status: DC | PRN
  Administered 2022-03-15: 50 mg via INTRAVENOUS

## 2022-03-15 MED ORDER — LACTATED RINGERS IV SOLN
INTRAVENOUS | Status: DC
  Administered 2022-03-15: 1000 mL via INTRAVENOUS

## 2022-03-15 MED ORDER — PNEUMOCOCCAL 20-VAL CONJ VACC 0.5 ML IM SUSY
0.5000 mL | PREFILLED_SYRINGE | INTRAMUSCULAR | Status: AC
  Administered 2022-03-16: 0.5 mL via INTRAMUSCULAR
  Filled 2022-03-15: qty 0.5

## 2022-03-15 MED ORDER — GABAPENTIN 300 MG PO CAPS
300.0000 mg | ORAL_CAPSULE | Freq: Two times a day (BID) | ORAL | Status: DC
  Administered 2022-03-15 – 2022-03-17 (×4): 300 mg via ORAL
  Filled 2022-03-15 (×4): qty 1

## 2022-03-15 MED ORDER — ROSUVASTATIN CALCIUM 5 MG PO TABS
5.0000 mg | ORAL_TABLET | Freq: Every day | ORAL | Status: DC
  Administered 2022-03-16 – 2022-03-17 (×2): 5 mg via ORAL
  Filled 2022-03-15 (×2): qty 1

## 2022-03-15 MED ORDER — ORAL CARE MOUTH RINSE: 15.0000 mL | Freq: Once | OROMUCOSAL | Status: AC

## 2022-03-15 MED ORDER — ROCURONIUM BROMIDE 10 MG/ML (PF) SYRINGE
PREFILLED_SYRINGE | INTRAVENOUS | Status: AC
  Filled 2022-03-15: qty 10

## 2022-03-15 MED ORDER — FUROSEMIDE 10 MG/ML IJ SOLN
40.0000 mg | Freq: Two times a day (BID) | INTRAMUSCULAR | Status: DC
  Administered 2022-03-15: 40 mg via INTRAVENOUS
  Filled 2022-03-15: qty 4

## 2022-03-15 MED ORDER — ONDANSETRON HCL 4 MG/2ML IJ SOLN
INTRAMUSCULAR | Status: AC
  Filled 2022-03-15: qty 2

## 2022-03-15 MED ORDER — HYDRALAZINE HCL 20 MG/ML IJ SOLN
10.0000 mg | INTRAMUSCULAR | Status: DC | PRN
  Administered 2022-03-15: 10 mg via INTRAVENOUS

## 2022-03-15 MED ORDER — 0.9 % SODIUM CHLORIDE (POUR BTL) OPTIME
TOPICAL | Status: DC | PRN
  Administered 2022-03-15: 1000 mL

## 2022-03-15 MED ORDER — SULFAMETHOXAZOLE-TRIMETHOPRIM 800-160 MG PO TABS: 1.0000 | ORAL_TABLET | Freq: Every day | ORAL | 0 refills | Status: DC

## 2022-03-15 MED ORDER — MIDAZOLAM HCL 2 MG/2ML IJ SOLN
INTRAMUSCULAR | Status: DC | PRN
  Administered 2022-03-15: 2 mg via INTRAVENOUS

## 2022-03-15 MED ORDER — PROPOFOL 10 MG/ML IV BOLUS
INTRAVENOUS | Status: AC
  Filled 2022-03-15: qty 20

## 2022-03-15 MED ORDER — CHLORHEXIDINE GLUCONATE 0.12 % MT SOLN
15.0000 mL | Freq: Once | OROMUCOSAL | Status: AC
  Administered 2022-03-15: 15 mL via OROMUCOSAL

## 2022-03-15 MED ORDER — DEXAMETHASONE SODIUM PHOSPHATE 10 MG/ML IJ SOLN
INTRAMUSCULAR | Status: DC | PRN
  Administered 2022-03-15: 4 mg via INTRAVENOUS

## 2022-03-15 MED ORDER — PROPOFOL 10 MG/ML IV BOLUS
INTRAVENOUS | Status: DC | PRN
  Administered 2022-03-15: 150 mg via INTRAVENOUS

## 2022-03-15 MED ORDER — FUROSEMIDE 10 MG/ML IJ SOLN
INTRAMUSCULAR | Status: AC
  Filled 2022-03-15: qty 6

## 2022-03-15 MED ORDER — HYDRALAZINE HCL 20 MG/ML IJ SOLN
10.0000 mg | Freq: Once | INTRAMUSCULAR | Status: AC
  Administered 2022-03-15: 10 mg via INTRAVENOUS

## 2022-03-15 MED ORDER — FUROSEMIDE 10 MG/ML IJ SOLN: 10.0000 mg | Freq: Once | INTRAMUSCULAR | Status: DC

## 2022-03-15 MED ORDER — OXYCODONE HCL 5 MG/5ML PO SOLN: 5.0000 mg | Freq: Once | ORAL | Status: DC | PRN

## 2022-03-15 MED ORDER — ONDANSETRON HCL 4 MG/2ML IJ SOLN
INTRAMUSCULAR | Status: DC | PRN
  Administered 2022-03-15: 4 mg via INTRAVENOUS

## 2022-03-15 MED ORDER — ORAL CARE MOUTH RINSE: 15.0000 mL | OROMUCOSAL | Status: DC | PRN

## 2022-03-15 MED ORDER — CHLORHEXIDINE GLUCONATE CLOTH 2 % EX PADS
6.0000 | MEDICATED_PAD | Freq: Every day | CUTANEOUS | Status: DC
  Administered 2022-03-15 – 2022-03-17 (×3): 6 via TOPICAL

## 2022-03-15 MED ORDER — FENTANYL CITRATE (PF) 100 MCG/2ML IJ SOLN
INTRAMUSCULAR | Status: AC
  Filled 2022-03-15: qty 2

## 2022-03-15 MED ORDER — DEXAMETHASONE SODIUM PHOSPHATE 10 MG/ML IJ SOLN
INTRAMUSCULAR | Status: AC
  Filled 2022-03-15: qty 1

## 2022-03-15 MED ORDER — FUROSEMIDE 10 MG/ML IJ SOLN
INTRAMUSCULAR | Status: AC
  Filled 2022-03-15: qty 2

## 2022-03-15 MED ORDER — HYDROMORPHONE HCL 1 MG/ML IJ SOLN
0.5000 mg | INTRAMUSCULAR | Status: DC | PRN
  Administered 2022-03-15 (×2): 0.5 mg via INTRAVENOUS
  Filled 2022-03-15 (×2): qty 1

## 2022-03-15 MED ORDER — LIDOCAINE HCL (PF) 2 % IJ SOLN
INTRAMUSCULAR | Status: AC
  Filled 2022-03-15: qty 5

## 2022-03-15 MED ORDER — IPRATROPIUM-ALBUTEROL 0.5-2.5 (3) MG/3ML IN SOLN
3.0000 mL | Freq: Once | RESPIRATORY_TRACT | Status: AC
  Administered 2022-03-15: 3 mL via RESPIRATORY_TRACT

## 2022-03-15 MED ORDER — LABETALOL HCL 5 MG/ML IV SOLN
10.0000 mg | INTRAVENOUS | Status: DC | PRN
  Administered 2022-03-15: 10 mg via INTRAVENOUS
  Filled 2022-03-15: qty 4

## 2022-03-15 MED ORDER — IPRATROPIUM-ALBUTEROL 0.5-2.5 (3) MG/3ML IN SOLN
RESPIRATORY_TRACT | Status: AC
  Filled 2022-03-15: qty 3

## 2022-03-15 MED ORDER — FENTANYL CITRATE PF 50 MCG/ML IJ SOSY
25.0000 ug | PREFILLED_SYRINGE | INTRAMUSCULAR | Status: DC | PRN
  Administered 2022-03-15 (×2): 50 ug via INTRAVENOUS

## 2022-03-15 MED ORDER — FENTANYL CITRATE (PF) 100 MCG/2ML IJ SOLN
INTRAMUSCULAR | Status: DC | PRN
  Administered 2022-03-15: 25 ug via INTRAVENOUS
  Administered 2022-03-15: 50 ug via INTRAVENOUS

## 2022-03-15 MED ORDER — IRBESARTAN 150 MG PO TABS
150.0000 mg | ORAL_TABLET | Freq: Every day | ORAL | Status: DC
  Administered 2022-03-15 – 2022-03-16 (×2): 150 mg via ORAL
  Filled 2022-03-15 (×2): qty 1

## 2022-03-15 MED ORDER — FUROSEMIDE 10 MG/ML IJ SOLN
60.0000 mg | Freq: Once | INTRAMUSCULAR | Status: AC
  Administered 2022-03-15: 60 mg via INTRAVENOUS

## 2022-03-15 MED ORDER — ONDANSETRON HCL 4 MG/2ML IJ SOLN: 4.0000 mg | Freq: Once | INTRAMUSCULAR | Status: DC | PRN

## 2022-03-15 MED ORDER — ACETAMINOPHEN 500 MG PO TABS
1000.0000 mg | ORAL_TABLET | Freq: Once | ORAL | Status: AC
  Administered 2022-03-15: 1000 mg via ORAL
  Filled 2022-03-15: qty 2

## 2022-03-15 SURGICAL SUPPLY — 39 items
ADH SKN CLS APL DERMABOND .7 (GAUZE/BANDAGES/DRESSINGS) ×1
APL SKNCLS STERI-STRIP NONHPOA (GAUZE/BANDAGES/DRESSINGS) ×1
BAG COUNTER SPONGE SURGICOUNT (BAG) IMPLANT
BAG SPNG CNTER NS LX DISP (BAG)
BENZOIN TINCTURE PRP APPL 2/3 (GAUZE/BANDAGES/DRESSINGS) ×2 IMPLANT
BLADE HEX COATED 2.75 (ELECTRODE) ×2 IMPLANT
BLADE SURG 15 STRL LF DISP TIS (BLADE) ×2 IMPLANT
BLADE SURG 15 STRL SS (BLADE) ×1
BNDG GAUZE DERMACEA FLUFF 4 (GAUZE/BANDAGES/DRESSINGS) ×2 IMPLANT
BNDG GZE DERMACEA 4 6PLY (GAUZE/BANDAGES/DRESSINGS) ×1
DERMABOND ADVANCED .7 DNX12 (GAUZE/BANDAGES/DRESSINGS) ×2 IMPLANT
DRAIN PENROSE 0.25X18 (DRAIN) ×2 IMPLANT
DRAIN PENROSE 0.5X18 (DRAIN) ×2 IMPLANT
DRAPE LAPAROTOMY T 98X78 PEDS (DRAPES) ×2 IMPLANT
ELECT REM PT RETURN 15FT ADLT (MISCELLANEOUS) ×2 IMPLANT
GAUZE PAD ABD 8X10 STRL (GAUZE/BANDAGES/DRESSINGS) IMPLANT
GAUZE SPONGE 4X4 12PLY STRL (GAUZE/BANDAGES/DRESSINGS) ×2 IMPLANT
GLOVE SURG LX STRL 7.5 STRW (GLOVE) ×2 IMPLANT
GOWN SRG XL LVL 4 BRTHBL STRL (GOWNS) ×2 IMPLANT
GOWN STRL NON-REIN XL LVL4 (GOWNS) ×1
KIT BASIN OR (CUSTOM PROCEDURE TRAY) ×2 IMPLANT
KIT TURNOVER KIT A (KITS) IMPLANT
NEEDLE HYPO 22GX1.5 SAFETY (NEEDLE) IMPLANT
NS IRRIG 1000ML POUR BTL (IV SOLUTION) IMPLANT
PACK BASIC VI WITH GOWN DISP (CUSTOM PROCEDURE TRAY) ×2 IMPLANT
PENCIL SMOKE EVACUATOR (MISCELLANEOUS) IMPLANT
SPONGE T-LAP 4X18 ~~LOC~~+RFID (SPONGE) ×4 IMPLANT
SUPPORT SCROTAL LG STRP (MISCELLANEOUS) ×2 IMPLANT
SUT ETHILON 3 0 PS 1 (SUTURE) IMPLANT
SUT MNCRL AB 4-0 PS2 18 (SUTURE) ×2 IMPLANT
SUT SILK 0 (SUTURE) ×1
SUT SILK 0 30XBRD TIE 6 (SUTURE) ×2 IMPLANT
SUT VIC AB 3-0 SH 27 (SUTURE) ×2
SUT VIC AB 3-0 SH 27XBRD (SUTURE) ×2 IMPLANT
SYR 20ML LL LF (SYRINGE) ×2 IMPLANT
SYR CONTROL 10ML LL (SYRINGE) IMPLANT
WATER STERILE IRR 1000ML POUR (IV SOLUTION) IMPLANT
YANKAUER SUCT BULB TIP 10FT TU (MISCELLANEOUS) IMPLANT
YANKAUER SUCT BULB TIP NO VENT (SUCTIONS) IMPLANT
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
## 2022-03-15 NOTE — Anesthesia Postprocedure Evaluation (Signed)
Anesthesia Post Note  Patient: Seth Santana  Procedure(s) Performed: INGUINAL RADICAL ORCHIECTOMY (Right)     Patient location during evaluation: PACU Anesthesia Type: General Level of consciousness: awake and alert Pain management: pain level controlled Respiratory status: spontaneous breathing and non-rebreather facemask Cardiovascular status: stable Postop Assessment: no apparent nausea or vomiting Anesthetic complications: no Comments: Patient with hypoxia in PACU, requiring O2 via NRBM. Initially hypertensive with SBP ~200, DBP 120's. Treated with hydralazine. Lungs with rhonchi b/l. CXR showing pulmonary congestion, read as CHF, hard to exclude aspiration. No signs of aspiration during procedure/emergence. Lasix given, CPAP ordered. Discussed with surgeon, will consult medicine team for admission and further care.   No notable events documented.  Last Vitals:  Vitals:   03/15/22 1430 03/15/22 1445  BP: (!) 186/99 (!) 181/97  Pulse: 81 86  Resp: (!) 32 (!) 37  Temp:    SpO2: 93% (!) 77%    Last Pain:  Vitals:   03/15/22 1445  TempSrc:   PainSc: [place]

## 2022-03-15 NOTE — Discharge Instructions (Addendum)
1- Stiches - Your stitches are all dissolvable. You may notice a "loose thread" at your incisions, these are normal and require no intervention. You may cut them flush to the skin with fingernail clippers if needed for comfort.  2 - Diet - No restrictions  3- Activity - No heavy lifting / straining (any activities that require valsalva or "bearing down") x 2 weeks. Otherwise, no restrictions.  4 - Bathing - You may shower immediately. Do not take a bath or get into swimming pool where incision sites are submersed in water x 4 weeks.   5- Drain - thin pink / blood discharge from area of scrotal drain is expected. Recommend using menstrual type pad in snug fitting underwear for about a week.   6 - When to Call the Doctor - Call MD for any fever >102, any acute wound problems, or any severe nausea / vomiting. You can call the [facility] [phone]) 24 hours a day 365 days a year. It will roll-over to the answering service and on-call physician after hours.

## 2022-03-15 NOTE — Evaluation (Signed)
SLP Cancellation Note  Patient Details Name: WIATT MAHABIR MRN: [id number] DOB: 05-02-1950   Cancelled treatment:       Reason Eval/Treat Not Completed: Other (comment) (pt on bipap, will follow up next date)   [name] 03/15/2022, 5:17 PM  Kathleen Lime, MS [place] Office [phone] Pager [phone]

## 2022-03-15 NOTE — Anesthesia Procedure Notes (Signed)
Procedure Name: Intubation Date/Time: 03/15/2022 11:53 AM  Performed by: Niel Hummer, CRNAPre-anesthesia Checklist: Patient identified, Emergency Drugs available, Suction available and Patient being monitored Patient Re-evaluated:Patient Re-evaluated prior to induction Oxygen Delivery Method: Circle system utilized Preoxygenation: Pre-oxygenation with 100% oxygen Induction Type: IV induction Ventilation: Two handed mask ventilation required Laryngoscope Size: Mac and 4 Grade View: Grade II Tube type: Oral Tube size: 7.5 mm Number of attempts: 1 Airway Equipment and Method: Stylet Placement Confirmation: ETT inserted through vocal cords under direct vision, positive ETCO2 and breath sounds checked- equal and bilateral Secured at: 23 cm Tube secured with: Tape Dental Injury: Teeth and Oropharynx as per pre-operative assessment

## 2022-03-15 NOTE — Brief Op Note (Signed)
03/15/2022  12:58 PM  PATIENT:  Seth Santana  71 y.o. male  PRE-OPERATIVE DIAGNOSIS:  RIGHT TESTIS MASS  POST-OPERATIVE DIAGNOSIS:  RIGHT TESTIS MASS  PROCEDURE:  Procedure(s): INGUINAL RADICAL ORCHIECTOMY (Right)  SURGEON:  Surgeon(s) and Role:    * [name], MD - Primary  PHYSICIAN ASSISTANT:   ASSISTANTS: none   ANESTHESIA:   general + local  EBL:  20 mL   BLOOD ADMINISTERED:none  DRAINS: Penrose drain in the dependant scrotum     LOCAL MEDICATIONS USED:  MARCAINE     SPECIMEN:  Source of Specimen:  Rt radical orchiectomy + scrotal skin  DISPOSITION OF SPECIMEN:  PATHOLOGY  COUNTS:  YES  TOURNIQUET:  * No tourniquets in log *  DICTATION: .Other Dictation: Dictation Number [id number]  PLAN OF CARE: Discharge to home after PACU  PATIENT DISPOSITION:  PACU - hemodynamically stable.   Delay start of Pharmacological VTE agent (>24hrs) due to surgical blood loss or risk of bleeding: yes

## 2022-03-15 NOTE — Op Note (Unsigned)
NAME: LOUIE, FLENNER MEDICAL RECORD NO: [id number] ACCOUNT NO: [id number] DATE OF BIRTH: 08-01-1950 FACILITY: [facility] LOCATION: [facility] PHYSICIAN: [name], MD  Operative Report   SURGEON:  [name], MD  PREOPERATIVE DIAGNOSIS:  Right testis mass with pain.  PROCEDURE:  Right radical orchiectomy.  ESTIMATED BLOOD LOSS:  20 mL.  COMPLICATIONS:  None.  SPECIMEN:  Right radical orchiectomy with right scrotal skin for permanent pathology.  FINDINGS:  Firm heterogeneous feeling inferior testis mass with questionable early direct invasion of the scrotal skin.  DRAINS:  Penrose drain to wound drainage.  INDICATIONS:  The patient is a 71 year old man with history of significant alcoholism, some mild cognitive decline.  He was found on workup of scrotal pain to have a heterogeneous very firm right inferior testis mass that was hypervascular on ultrasound.   This was not obviously associated with any infectious process.  Tumor markers for germ cell tumor were unremarkable.  Given the significant concern for primary neoplasm plus this pain, it was clearly felt that orchiectomy was warranted for both symptom  release and oncologic purposes with radical inguinal approach being preferred.  He presents for this today.  Informed consent was obtained and placed in medical record.  PROCEDURE IN DETAIL:  The patient being Quindell Shere verified and procedure being right radical orchiectomy was confirmed.  Procedure timeout was performed.  Intravenous antibiotics were administered.  General anesthesia was induced.  The patient was  placed in a supine position.  Sterile field was created, prepped and draped the patient's penis, perineum, and proximal thighs and his right and left groin using iodine.  Under anesthesia, the left hemiscrotum and testis was palpably unremarkable.  The  right testis again had a very firm heterogeneous mass that was nonfluctuant but also under anesthesia, this appeared  to be much more adherent to the scrotal skin not directly invading through and through, but very very densely adherent on exam.  At this  point, plan was to approach this distal inguinally and see if this could be separated from the subcutaneous tissue with inguinal approach. As such, an approximately 4 cm incision was made along Langer's lines in the right groin directly on to the  palpable area of the external ring and cord.  Dissection was carried down until the cord was circumferentially mobilized and tagged with 1/4-inch Penrose drain and this was dissected distally towards the area of the scrotum and using some superior  traction on the scrotal area, the right testis was delivered through the groin incision.  Very careful dissection was performed circumferentially towards the inferior aspect of the testis, which visibly was also incredibly densely adherent to the  underlying tissue and there was not an obvious plane between the testis and the skin.  This was quite concerning for possible direct invasion of neoplasm throuhgh deep aspects of the scrotal skin.  I felt the most prudent means of management would be to excise a wide ellipse  of scrotal skin, so this could be removed en bloc.  Testis delivered into the scrotum and an elliptical area approximately 4 cm long axis, 2.5 cm short axis was marked, circumferentially incised and carried down to the subcutaneous tissue, keeping it  palpably wide margin away from the area of the testis.  The testis was then redelivered via the inguinal incision and a proper plane was established circumferentially such that the right testis with its adherent ellipse of scrotal skin was completely  delivered.  Attention was directed at control of  the cord.  A silk tie was used as proximally as possible directly to the external ring.  A 3-inch tag was left in place on this cord just distal to this and divided in 2 segments.  An additional silk tie was  placed x2 in each  segment and cold Metz scissors were used to transect the cord distal to these ties, resulted in excellent hemostatic control of the cord.  The cord stump was essentially residing directly proximal to the external ring with its in situ  silk tag. As there had been significant subcutaneous dissection using a combination of scrotal and inguinal approach, a decision was made to leave a small 1/4-inch Penrose drain and a counter incision was made approximately 2 cm inferior, lateral to the  scrotal incision.  The Penrose drain was brought through this and carried towards the area of the inguinal incision.  Additional hemostasis was achieved with point coagulation current.  The dartos of the scrotum was closed using running 3-0 Vicryl as was  the dartos of the skin and inguinal incision.  Both incisions were infiltrated with dilute lipolyzed Marcaine and closed at the level of the skin using subcuticular Monocryl.  A drain stitch was applied.  Drain was trimmed to length.  Procedure was  terminated.  The patient tolerated procedure well, no immediate perioperative complications.  The patient was taken to postanesthesia care unit in stable condition.  Plan for discharge home, likely followup early next week for office drain removal.   NIK D: 03/15/2022 1:06:34 pm T: 03/15/2022 9:30:00 pm  JOB: [id number]/ [id number]

## 2022-03-15 NOTE — Anesthesia Preprocedure Evaluation (Addendum)
Anesthesia Evaluation  Patient identified by MRN, date of birth, ID band Patient awake    Reviewed: Allergy & Precautions, NPO status , Patient's Chart, lab work & pertinent test results  History of Anesthesia Complications Negative for: history of anesthetic complications  Airway Mallampati: II  TM Distance: >3 FB Neck ROM: Full    Dental  (+) Edentulous Upper, Edentulous Lower   Pulmonary Current Smoker and Patient abstained from smoking.   Pulmonary exam normal        Cardiovascular hypertension, Pt. on medications Normal cardiovascular exam     Neuro/Psych  Headaches  Hard of hearing   negative psych ROS   GI/Hepatic Neg liver ROS,GERD  Medicated and Controlled,,  Endo/Other  negative endocrine ROS    Renal/GU negative Renal ROS    Testicular mass     Musculoskeletal negative musculoskeletal ROS (+)    Abdominal   Peds  Hematology  (+) Blood dyscrasia, anemia   Anesthesia Other Findings   Reproductive/Obstetrics                             Anesthesia Physical Anesthesia Plan  ASA: 3  Anesthesia Plan: General   Post-op Pain Management: Tylenol PO (pre-op)*   Induction: Intravenous  PONV Risk Score and Plan: 1 and Treatment may vary due to age or medical condition, Ondansetron and Dexamethasone  Airway Management Planned: Oral ETT  Additional Equipment: None  Intra-op Plan:   Post-operative Plan: Extubation in OR  Informed Consent: I have reviewed the patients History and Physical, chart, labs and discussed the procedure including the risks, benefits and alternatives for the proposed anesthesia with the patient or authorized representative who has indicated his/her understanding and acceptance.     Dental advisory given  Plan Discussed with: CRNA and Anesthesiologist  Anesthesia Plan Comments:        Anesthesia Quick Evaluation

## 2022-03-15 NOTE — Transfer of Care (Signed)
Immediate Anesthesia Transfer of Care Note  Patient: Seth Santana  Procedure(s) Performed: INGUINAL RADICAL ORCHIECTOMY (Right)  Patient Location: PACU  Anesthesia Type:General  Level of Consciousness: awake, alert , and oriented  Airway & Oxygen Therapy: Patient Spontanous Breathing and Patient connected to face mask oxygen  Post-op Assessment: Report given to RN, Post -op Vital signs reviewed and stable, and Patient moving all extremities X 4  Post vital signs: Reviewed and stable  Last Vitals:  Vitals Value Taken Time  BP 194/102 03/15/22 1307  Temp    Pulse 86 03/15/22 1308  Resp 25 03/15/22 1308  SpO2 91 % 03/15/22 1308  Vitals shown include unvalidated device data.  Last Pain:  Vitals:   03/15/22 1041  TempSrc: Oral         Complications: No notable events documented.

## 2022-03-15 NOTE — H&P (Signed)
History and Physical    Seth Santana JTT:[id number] DOB: [birth year] DOA: 03/15/2022  PCP: [name], DO Patient coming from: Home. Lives with daughter. Independent   Chief Complaint: "Respiratory failure"  HPI: Seth Santana 71 year old M with history of right testicular mass, left renal angiomyolipoma, BPH, CKD-3B, LE edema and diminished hearing who underwent right radical inguinal orchiectomy by Dr. Tresa Moore and developed respiratory failure with hypoxia postoperatively requiring BiPAP.  Hospitalist service called for admission due to acute respiratory failure with hypoxia.  Patient desaturated to 77% on 12 L by NRB and started on BiPAP.  He was hypotensive to 190/110s.   On my evaluation, patient denies shortness of breath, chest pain, cough, nausea, vomiting or abdominal pain.  He admits to pain at surgical site.  Denies history of CHF, CAD or COPD.  He denies fever or chills.  Patient history is limited by his difficulty hearing but supplemented by patient's daughter at bedside.  Per daughter, patient has lower extremity edema due to amlodipine he was taking previously.,  No history of CHF, CAD, diabetes or COPD.  Denies orthopnea or PND.  Per daughter, he did not take his blood pressure medication in the last 2 days.  Patient smokes to 2 cigars a day.  Drinks about 40 ounce of beer at once but not daily.  Admits to smoking marijuana.  Denies other recreational drug use.  Prefers to remain full code.  While in PACU, BP elevated to 190s/100s.  Started on BiPAP.  Preop labs significant for Cr 1.74 (better than prior) and Hgb of 8.6 (about baseline).  Portable CXR concerning for CHF.  ROS All review of system negative except for pertinent positives and negatives as history of present illness above.  PMH Past Medical History:  Diagnosis Date   Blood in stool last few years   BPH (benign prostatic hyperplasia)    Hard of hearing    Headache(784.0)    Hypertension    PSH Past  Surgical History:  Procedure Laterality Date   COLONOSCOPY WITH PROPOFOL N/A 10/09/2013   Procedure: COLONOSCOPY WITH PROPOFOL;  Surgeon: Garlan Fair, MD;  Location: [facility];  Service: Endoscopy;  Laterality: N/A;   NO PAST SURGERIES     Fam HX Family History  Problem Relation Age of Onset   Kidney disease Mother    Diabetes Mother    Cancer Brother     Social Hx  reports that he has been smoking cigarettes. He has a 7.50 pack-year smoking history. He has never used smokeless tobacco. He reports current alcohol use. He reports current drug use. Drug: Marijuana.  Allergy No Known Allergies Home Meds Prior to Admission medications   Medication Sig Start Date End Date Taking? Authorizing Provider  acetaminophen (TYLENOL) 500 MG tablet Take 500 mg by mouth every 6 (six) hours as needed for moderate pain.   Yes [provider]  Cyanocobalamin (B-12 PO) Take 1 tablet by mouth daily as needed (energy).   Yes [provider]  FIBER PO Take 1 tablet by mouth daily as needed (constipation).   Yes [provider]  finasteride (PROSCAR) 5 MG tablet Take 5 mg by mouth daily.   Yes [provider]  gabapentin (NEURONTIN) 300 MG capsule Take 300 mg by mouth 2 (two) times daily. 02/16/22  Yes [provider]  hydrOXYzine (ATARAX) 10 MG tablet Take 10 mg by mouth every 8 (eight) hours as needed for itching.   Yes [provider]  Vilma Prader  20 MG tablet Take 20 mg by mouth daily. 02/16/22  Yes [provider]  Multiple Vitamins-Minerals (MENS 50+ MULTIVITAMIN) TABS Take 1 tablet by mouth daily.   Yes [provider]  omeprazole (PRILOSEC) 40 MG capsule Take 40 mg by mouth daily. 02/15/22  Yes [provider]  oxyCODONE (ROXICODONE) 5 MG immediate release tablet Take 1 tablet (5 mg total) by mouth every 6 (six) hours as needed for moderate pain or severe pain (post-operatively). 03/15/22 03/15/23 Yes [name], MD   rosuvastatin (CRESTOR) 5 MG tablet Take 5 mg by mouth daily.   Yes [provider]  senna-docusate (SENOKOT-S) 8.6-50 MG tablet Take 1 tablet by mouth 2 (two) times daily. While taking strong pain meds to prevent constipation 03/15/22  Yes [name], MD  sulfamethoxazole-trimethoprim (BACTRIM DS) 800-160 MG tablet Take 1 tablet by mouth daily. X 5 days to prevent infection with drain in place. 03/15/22  Yes [name], MD  triamcinolone ointment (KENALOG) 0.1 % Apply 1 Application topically 2 (two) times daily as needed (itching).   Yes [provider]  valsartan (DIOVAN) 320 MG tablet Take 320 mg by mouth daily. 02/23/22  Yes [provider]  traMADol (ULTRAM) 50 MG tablet Take 1 tablet (50 mg total) by mouth every 12 (twelve) hours as needed for moderate pain. Patient not taking: Reported on 03/09/2022 01/15/15   Willeen Niece, MD    Physical Exam: Vitals:   03/15/22 1530 03/15/22 1535 03/15/22 1545 03/15/22 1600  BP: (!) 185/92 (!) 185/90 (!) 193/100 (!) 183/92  Pulse:  78 75 68  Resp:  (!) 34 (!) 30 16  Temp:      TempSrc:      SpO2:  100% 100% 100%    GENERAL: No acute distress.  Appears well.  HEENT: MMM.  Vision and hearing grossly intact.  NECK: Supple.  No apparent JVD.  RESP: On BiPAP.  RR in 53s.  Fair aeration CVS:  RRR. Heart sounds normal.  ABD/GI/GU: Bowel sounds present. Soft. Non tender.  Bulky dressing over groin MSK/EXT:  Moves extremities. No apparent deformity.  Trace BLE edema. SKIN: no apparent skin lesion or wound NEURO: Awake, alert and oriented appropriately.  No gross deficit.  PSYCH: Calm. Normal affect.   Personally Reviewed Radiological Exams See HPI   Personally Reviewed Labs: CBC: Recent Labs  Lab 03/13/22 1432  WBC 6.0  HGB 8.6*  HCT 27.5*  MCV 93.2  PLT 625   Basic Metabolic Panel: Recent Labs  Lab 03/13/22 1432  NA 140  K 4.3  CL 110  CO2 24  GLUCOSE 88  BUN 22  CREATININE 1.74*  CALCIUM  9.6   GFR: Estimated Creatinine Clearance: 44.1 mL/min (A) (by C-G formula based on SCr of 1.74 mg/dL (H)). Liver Function Tests: No results for input(s): "AST", "ALT", "ALKPHOS", "BILITOT", "PROT", "ALBUMIN" in the last 168 hours. No results for input(s): "LIPASE", "AMYLASE" in the last 168 hours. No results for input(s): "AMMONIA" in the last 168 hours. Coagulation Profile: No results for input(s): "INR", "PROTIME" in the last 168 hours. Cardiac Enzymes: No results for input(s): "CKTOTAL", "CKMB", "CKMBINDEX", "TROPONINI" in the last 168 hours. BNP (last 3 results) No results for input(s): "PROBNP" in the last 8760 hours. HbA1C: No results for input(s): "HGBA1C" in the last 72 hours. CBG: No results for input(s): "GLUCAP" in the last 168 hours. Lipid Profile: No results for input(s): "CHOL", "HDL", "LDLCALC", "TRIG", "CHOLHDL", "LDLDIRECT" in the last 72 hours. Thyroid Function  Tests: No results for input(s): "TSH", "T4TOTAL", "FREET4", "T3FREE", "THYROIDAB" in the last 72 hours. Anemia Panel: No results for input(s): "VITAMINB12", "FOLATE", "FERRITIN", "TIBC", "IRON", "RETICCTPCT" in the last 72 hours. Urine analysis:    Component Value Date/Time   COLORURINE YELLOW 02/08/2013 [place] 02/08/2013 1555   LABSPEC >1.046 (H) 02/08/2013 1555   PHURINE 5.0 02/08/2013 1555   GLUCOSEU NEGATIVE 02/08/2013 1555   HGBUR NEGATIVE 02/08/2013 1555   BILIRUBINUR neg 05/23/2013 1633   KETONESUR NEGATIVE 02/08/2013 1555   PROTEINUR 100 05/23/2013 1633   PROTEINUR NEGATIVE 02/08/2013 1555   UROBILINOGEN 0.2 05/23/2013 1633   UROBILINOGEN 0.2 02/08/2013 1555   NITRITE neg 05/23/2013 1633   NITRITE NEGATIVE 02/08/2013 1555   LEUKOCYTESUR Trace 05/23/2013 1633     Assessment and plan: Principal Problem:   Acute respiratory failure with hypoxia (HCC) Active Problems:   Sensorineural hearing loss (SNHL) of both ears   Acute CHF (HCC)   Chronic kidney disease, stage 3b  (HCC)   Testicular mass   BPH (benign prostatic hyperplasia)   Uncontrolled hypertension  Acute respiratory failure with hypoxia: Likely due to acute CHF versus aspiration.  Reportedly did not take his blood pressure medication in 2 days.  Desaturated to 77% on 12 L by NRB requiring BiPAP.  CXR concerning for CHF.  No history of CHF.  ABG basically normal but after BiPAP. -Admitted to stepdown unit.  Check BNP -Continue BiPAP and wean as tolerated -IV Lasix 60 mg x 1 followed by 40 mg twice daily -Echocardiogram, strict intake and output and daily weight -N.p.o. while on BiPAP -IV labetalol as needed for blood pressure -Hold off antibiotics for now  Uncontrolled hypertension/hypertensive urgency: Missed his blood pressure meds for 2 days. 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 Tresa Moore -Defer to urology.  CKD-3B: Stable. -Monitor while on diuretics.  Normocytic anemia: H&H at baseline. -Monitor  Alcohol use disorder/tobacco use disorder/marijuana use -Encouraged cessation. -Monitor for withdrawal   DVT prophylaxis: SCD  Code Status: Full code Family Communication: Updated patient's daughter at bedside  Consults called: Urology Admission status: Inpatient   Mercy Riding MD [facility]  If 7PM-7AM, please contact night-coverage [email or web address]  03/15/2022, 4:10 PM

## 2022-03-16 ENCOUNTER — Encounter (HOSPITAL_COMMUNITY): Payer: Self-pay | Admitting: Urology

## 2022-03-16 ENCOUNTER — Inpatient Hospital Stay (HOSPITAL_COMMUNITY): Payer: Medicare Other

## 2022-03-16 DIAGNOSIS — J9601 Acute respiratory failure with hypoxia: Secondary | ICD-10-CM | POA: Diagnosis not present

## 2022-03-16 DIAGNOSIS — D62 Acute posthemorrhagic anemia: Secondary | ICD-10-CM

## 2022-03-16 DIAGNOSIS — I5031 Acute diastolic (congestive) heart failure: Secondary | ICD-10-CM | POA: Diagnosis not present

## 2022-03-16 DIAGNOSIS — N401 Enlarged prostate with lower urinary tract symptoms: Secondary | ICD-10-CM | POA: Diagnosis not present

## 2022-03-16 DIAGNOSIS — N5089 Other specified disorders of the male genital organs: Secondary | ICD-10-CM | POA: Diagnosis not present

## 2022-03-16 DIAGNOSIS — I10 Essential (primary) hypertension: Secondary | ICD-10-CM | POA: Diagnosis not present

## 2022-03-16 DIAGNOSIS — K219 Gastro-esophageal reflux disease without esophagitis: Secondary | ICD-10-CM

## 2022-03-16 LAB — CBC
HCT: 22.8 % — ABNORMAL LOW (ref 39.0–52.0)
HCT: 22.3 % — ABNORMAL LOW (ref 39.0–52.0)
Hemoglobin: 7.1 g/dL — ABNORMAL LOW (ref 13.0–17.0)
Hemoglobin: 6.8 g/dL — CL (ref 13.0–17.0)
MCH: 28.9 pg (ref 26.0–34.0)
MCH: 28.6 pg (ref 26.0–34.0)
MCHC: 31.1 g/dL (ref 30.0–36.0)
MCHC: 30.5 g/dL (ref 30.0–36.0)
MCV: 92.7 fL (ref 80.0–100.0)
MCV: 93.7 fL (ref 80.0–100.0)
Platelets: 289 10*3/uL (ref 150–400)
Platelets: 258 10*3/uL (ref 150–400)
RBC: 2.46 MIL/uL — ABNORMAL LOW (ref 4.22–5.81)
RBC: 2.38 MIL/uL — ABNORMAL LOW (ref 4.22–5.81)
RDW: 14.2 % (ref 11.5–15.5)
RDW: 14.4 % (ref 11.5–15.5)
WBC: 7.7 10*3/uL (ref 4.0–10.5)
WBC: 8.3 10*3/uL (ref 4.0–10.5)
nRBC: 0 % (ref 0.0–0.2)
nRBC: 0 % (ref 0.0–0.2)

## 2022-03-16 LAB — RETICULOCYTES
Immature Retic Fract: 11.6 % (ref 2.3–15.9)
RBC.: 2.36 MIL/uL — ABNORMAL LOW (ref 4.22–5.81)
Retic Count, Absolute: 53.3 10*3/uL (ref 19.0–186.0)
Retic Ct Pct: 2.3 % (ref 0.4–3.1)

## 2022-03-16 LAB — ECHOCARDIOGRAM COMPLETE
AR max vel: 2.83 cm2
AV Area VTI: 2.73 cm2
AV Area mean vel: 3.04 cm2
AV Mean grad: 4 mmHg
AV Peak grad: 8.8 mmHg
Ao pk vel: 1.48 m/s
Area-P 1/2: 3.72 cm2
Height: 69 in
MV VTI: 2.63 cm2
S' Lateral: 4.2 cm
Weight: 3089.97 oz

## 2022-03-16 LAB — RENAL FUNCTION PANEL
Albumin: 3.2 g/dL — ABNORMAL LOW (ref 3.5–5.0)
Anion gap: 9 (ref 5–15)
BUN: 32 mg/dL — ABNORMAL HIGH (ref 8–23)
CO2: 23 mmol/L (ref 22–32)
Calcium: 9 mg/dL (ref 8.9–10.3)
Chloride: 108 mmol/L (ref 98–111)
Creatinine, Ser: 1.98 mg/dL — ABNORMAL HIGH (ref 0.61–1.24)
GFR, Estimated: 36 mL/min — ABNORMAL LOW (ref >60)
Glucose, Bld: 114 mg/dL — ABNORMAL HIGH (ref 70–99)
Phosphorus: 4.5 mg/dL (ref 2.5–4.6)
Potassium: 4.6 mmol/L (ref 3.5–5.1)
Sodium: 140 mmol/L (ref 135–145)

## 2022-03-16 LAB — PREPARE RBC (CROSSMATCH)

## 2022-03-16 LAB — MAGNESIUM: Magnesium: 2 mg/dL (ref 1.7–2.4)

## 2022-03-16 LAB — FERRITIN: Ferritin: 62 ng/mL (ref 24–336)

## 2022-03-16 LAB — IRON AND TIBC
Iron: 24 ug/dL — ABNORMAL LOW (ref 45–182)
Saturation Ratios: 9 % — ABNORMAL LOW (ref 17.9–39.5)
TIBC: 281 ug/dL (ref 250–450)
UIBC: 257 ug/dL

## 2022-03-16 LAB — BRAIN NATRIURETIC PEPTIDE: B Natriuretic Peptide: 217.3 pg/mL — ABNORMAL HIGH (ref 0.0–100.0)

## 2022-03-16 LAB — SURGICAL PATHOLOGY

## 2022-03-16 LAB — VITAMIN B12: Vitamin B-12: 395 pg/mL (ref 180–914)

## 2022-03-16 LAB — ABO/RH: ABO/RH(D): O POS

## 2022-03-16 LAB — FOLATE: Folate: 13.6 ng/mL (ref >5.9)

## 2022-03-16 MED ORDER — CARVEDILOL 6.25 MG PO TABS
6.2500 mg | ORAL_TABLET | Freq: Two times a day (BID) | ORAL | Status: DC
  Administered 2022-03-16: 6.25 mg via ORAL
  Filled 2022-03-16: qty 1

## 2022-03-16 MED ORDER — SODIUM CHLORIDE 0.9% IV SOLUTION: Freq: Once | INTRAVENOUS | Status: AC

## 2022-03-16 MED ORDER — PANTOPRAZOLE SODIUM 40 MG PO TBEC
40.0000 mg | DELAYED_RELEASE_TABLET | Freq: Every day | ORAL | Status: DC
  Administered 2022-03-16 – 2022-03-17 (×2): 40 mg via ORAL
  Filled 2022-03-16 (×2): qty 1

## 2022-03-16 MED ORDER — FUROSEMIDE 10 MG/ML IJ SOLN
40.0000 mg | Freq: Every day | INTRAMUSCULAR | Status: DC
  Administered 2022-03-17: 40 mg via INTRAVENOUS
  Filled 2022-03-16: qty 4

## 2022-03-16 MED ORDER — ACETAMINOPHEN 325 MG PO TABS
650.0000 mg | ORAL_TABLET | ORAL | Status: DC | PRN
  Administered 2022-03-16: 650 mg via ORAL
  Filled 2022-03-16: qty 2

## 2022-03-16 NOTE — Progress Notes (Incomplete)
*  PRELIMINARY RESULTS* Echocardiogram 2D Echocardiogram has been performed.  Seth Santana 03/16/2022, 4:06 PM

## 2022-03-16 NOTE — TOC Initial Note (Signed)
Transition of Care [facility]) - Initial/Assessment Note    Patient Details  Name: Seth Santana MRN: [id number] Date of Birth: [birth year]  Transition of [facility]) CM/SW Contact:    Leeroy Cha, RN Phone Number: 03/16/2022, 7:42 AM  Clinical Narrative:                 Currently smokes cigarettes, intake of alcohol and marijuana.  Will include information on smoking cessation, affects of alcohol and marijuana on the body to the dc instructions.  Expected Discharge Plan: Home/Self Care Barriers to Discharge: Continued Medical Work up   Patient Goals and CMS Choice Patient states their goals for this hospitalization and ongoing recovery are:: to go home CMS Medicare.gov Compare Post Acute Care list provided to:: Patient Choice offered to / list presented to : Patient  Expected Discharge Plan and Services Expected Discharge Plan: Home/Self Care   Discharge Planning Services: CM Consult   Living arrangements for the past 2 months: No permanent address Expected Discharge Date: 03/15/22                                    Prior Living Arrangements/Services Living arrangements for the past 2 months: No permanent address Lives with:: Self Patient language and need for interpreter reviewed:: Yes Do you feel safe going back to the place where you live?: Yes            Criminal Activity/Legal Involvement Pertinent to Current Situation/Hospitalization: No - Comment as needed  Activities of Daily Living Home Assistive Devices/Equipment: Hearing aid ADL Screening (condition at time of admission) Patient's cognitive ability adequate to safely complete daily activities?: Yes Is the patient deaf or have difficulty hearing?: Yes Does the patient have difficulty seeing, even when wearing glasses/contacts?: No Does the patient have difficulty concentrating, remembering, or making decisions?: No Patient able to express need for assistance with ADLs?: Yes Does the patient have  difficulty dressing or bathing?: No Independently performs ADLs?: Yes (appropriate for developmental age) Does the patient have difficulty walking or climbing stairs?: No Weakness of Legs: None Weakness of Arms/Hands: None  Permission Sought/Granted                  Emotional Assessment Appearance:: Appears stated age Attitude/Demeanor/Rapport: Engaged Affect (typically observed): Calm Orientation: : Oriented to Self, Oriented to Place, Oriented to  Time, Oriented to Situation Alcohol / Substance Use: Illicit Drugs, Alcohol Use, Tobacco Use (current smoker, current etoh intake and marijuana use.) Psych Involvement: No (comment)  Admission diagnosis:  Acute respiratory failure ([place]) [J96.00] Patient Active Problem List   Diagnosis Date Noted   Acute respiratory failure with hypoxia ([place]) 03/15/2022   Acute CHF ([place]) 03/15/2022   Chronic kidney disease, stage 3b ([place]) 03/15/2022   Testicular mass 03/15/2022   BPH (benign prostatic hyperplasia) 03/15/2022   Uncontrolled hypertension 03/15/2022   Sensorineural hearing loss (SNHL) of both ears 11/22/2021   Closed fracture of tripod ([place]) 02/18/2013   PCP:  [name], DO Pharmacy:   [id number] [id number]: [phone]: [phone] [place]Phone: [phone]: [phone] [facility] [address] [place] [address] Phone: [phone] Fax: [phone]     Social Determinants of Health (SDOH) Interventions    Readmission Risk Interventions   No data to display

## 2022-03-16 NOTE — Evaluation (Signed)
Clinical/Bedside Swallow Evaluation Patient Details  Name: Seth Santana MRN: [id number] Date of Birth: [birth year]  Today's Date: 03/16/2022 Time: SLP Start Time (ACUTE ONLY): 0944 SLP Stop Time (ACUTE ONLY): 1005 SLP Time Calculation (min) (ACUTE ONLY): 21 min  Past Medical History:  Past Medical History:  Diagnosis Date   Blood in stool last few years   BPH (benign prostatic hyperplasia)    Hard of hearing    Headache(784.0)    Hypertension    Past Surgical History:  Past Surgical History:  Procedure Laterality Date   COLONOSCOPY WITH PROPOFOL N/A 10/09/2013   Procedure: COLONOSCOPY WITH PROPOFOL;  Surgeon: Garlan Fair, MD;  Location: [facility];  Service: Endoscopy;  Laterality: N/A;   NO PAST SURGERIES     ORCHIECTOMY Right 03/15/2022   Procedure: INGUINAL RADICAL ORCHIECTOMY;  Surgeon: [name], MD;  Location: [facility];  Service: Urology;  Laterality: Right;   HPI:  Pt is a 71 yo male presented for OP surgery - to remove right testes mass, s/p surgery by dr Tresa Moore.  Following surgery, pt had repiratory difficulties and required bipap.  CXR 12/6 Probable congestive heart failure.  Difficult to exclude aspiration Pt also with h/o Blood in stool last few years.  BPH (benign prostatic hyperplasia)  Hard of hearing Headache and Hypertension. Swallow evaluation ordered.    Assessment / Plan / Recommendation  Clinical Impression  Patient presents with functional oropharyngeal swallow ability based on clinical swallow evaluation.  He easily passed 3 ounce [place] water challenge/screen.   No focal CN deficits and no indication of dysphagia with po observed. Pt with empty breakfast tray on his table and denies coughing/difficulty with any po duirng hospital coarse. He does endorse issues with "heart burn", sensation of food sticking in throat more than liquids and globus.  Above symptoms causes SLP to suspect he may have GERD especially given use of cigarettes, alcohol and  marijuana.     He has recently received new dentures that are ill fitting per him and his daughter and uses adhesives for them.   Advised pt to masticate well - eat soft foods and drink liquids t/o meal.  Reviewed heimlich manuever with pt and his daughter.   No SLP follow up indicated. SLP Visit Diagnosis: Dysphagia, unspecified (R13.10)    Aspiration Risk  Mild aspiration risk    Diet Recommendation Regular;Thin liquid   Liquid Administration via: Cup;Straw Medication Administration: Whole meds with liquid Supervision: Patient able to self feed Compensations: Slow rate;Small sips/bites    Other  Recommendations Oral Care Recommendations: Oral care BID    Recommendations for follow up therapy are one component of a multi-disciplinary discharge planning process, led by the attending physician.  Recommendations may be updated based on patient status, additional functional criteria and insurance authorization.  Follow up Recommendations No SLP follow up      Assistance Recommended at Discharge  N/a  Functional Status Assessment  N/a  Frequency and Duration     N/a       Prognosis   N/a     Swallow Study   General Date of Onset: 03/16/22 HPI: Pt is a 71 yo male presented for OP surgery - to remove right testes mass, s/p surgery by dr Tresa Moore.  Following surgery, pt had repiratory difficulties and required bipap.  CXR 12/6 Probable congestive heart failure.  Difficult to exclude aspiration Pt also with h/o Blood in stool last few years.  BPH (benign prostatic hyperplasia)  Hard of hearing Headache  and Hypertension. Swallow evaluation ordered. Type of Study: Bedside Swallow Evaluation Diet Prior to this Study: Regular;Thin liquids Respiratory Status: Room air History of Recent Intubation: No Behavior/Cognition: Alert;Cooperative;Pleasant mood Oral Cavity Assessment: Within Functional Limits Oral Care Completed by SLP: No Oral Cavity - Dentition: Adequate natural  dentition Vision: Functional for self-feeding Self-Feeding Abilities: Able to feed self Patient Positioning: Upright in bed Baseline Vocal Quality: Normal Volitional Cough: Strong Volitional Swallow: Able to elicit    Oral/Motor/Sensory Function Overall Oral Motor/Sensory Function: Within functional limits   Ice Chips Ice chips: Not tested   Thin Liquid Thin Liquid: Within functional limits Presentation: Self Fed;Spoon    Nectar Thick Nectar Thick Liquid: Not tested   Honey Thick Honey Thick Liquid: Not tested   Puree Puree: Not tested   Solid     Solid: Within functional limits Presentation: Self Fed      Seth Santana 03/16/2022,10:22 AM  Kathleen Lime, MS [place] Office [phone] Pager [phone]

## 2022-03-16 NOTE — Progress Notes (Signed)
Pt off BIPAP at this time.  

## 2022-03-16 NOTE — Progress Notes (Signed)
PROGRESS NOTE  Seth Santana NUU:[id number] DOB: 10/02/1950   PCP: [name], DO  Patient is from: Home.  Lives with daughter.  Independent at baseline.  DOA: 03/15/2022 LOS: 1  Chief complaints Respiratory failure    Brief Narrative / Interim history: 71 year old M with history of right testicular mass, left renal angiomyolipoma, BPH, CKD-3B, LE edema and diminished hearing who underwent right radical inguinal orchiectomy by Dr. Tresa Moore and developed respiratory failure with hypoxia postoperatively requiring BiPAP, and admitted for acute respiratory failure with hypoxia due to acute CHF.  Patient desaturated to 77% on 12 L by NRB requiring BiPAP.  He was hypotensive with SBP in 190s and DBP in 110s.  Chest x-ray suggested CHF.  BNP slightly elevated.  Started on IV Lasix.   Subjective: Seen and examined earlier this morning.  No major events overnight of this morning.  No complaints.  Reports improvement in his breathing.  He is currently on room air.  Denies chest pain, nausea, vomiting, abdominal pain or UTI symptoms.  Denies bleeding anywhere.  Denies history of melena or hematochezia.  Objective: Vitals:   03/16/22 0800 03/16/22 0900 03/16/22 1000 03/16/22 1100  BP: (!) 163/69 (!) 152/65 (!) 162/68 (!) 164/77  Pulse: 92 88 88 84  Resp: (!) 28 (!) 23 (!) 22   Temp: 99.6 F (37.6 C)     TempSrc: Axillary     SpO2: 99% 97% 98% 97%  Weight:      Height:        Examination:  GENERAL: No apparent distress.  Nontoxic. HEENT: MMM.  Vision and hearing grossly intact.  NECK: Supple.  No apparent JVD.  RESP:  No IWOB.  Fair aeration bilaterally.  Bibasilar crackles. CVS:  RRR. Heart sounds normal.  ABD/GI/GU: BS+. Abd soft, NTND.  MSK/EXT:  Moves extremities. No apparent deformity.  Trace BLE edema.  Skin change due to venous insufficiency SKIN: Right groin incision DCI. NEURO: Awake, alert and oriented appropriately.  No apparent focal neuro deficit. PSYCH: Calm. Normal  affect.   Procedures:  12/6-right radical inguinal orchiectomy by Dr. Tresa Moore  Microbiology summarized: MRSA PCR screen nonreactive.  Assessment and plan: Principal Problem:   Acute respiratory failure with hypoxia (HCC) Active Problems:   Sensorineural hearing loss (SNHL) of both ears   Acute CHF (HCC)   Chronic kidney disease, stage 3b (HCC)   Testicular mass   BPH (benign prostatic hyperplasia)   Uncontrolled hypertension  Acute respiratory failure with hypoxia due to acute CHF (unknown type): Reportedly did not take his BP meds in 2 days.  Desaturated to 77% on 12 L by NRB requiring BiPAP.  CXR concerning for CHF.  No history of CHF.  BNP 270.  ABG basically normal but after BiPAP.  Low suspicion for hemodynamically significant PE without tachycardia and hypotension.  Aspiration is another possibility.  Patient was started on IV Lasix.  About 2.4 L urine output overnight.  Creatinine slightly up but about baseline.  Repeat chest x-ray with improved CHF.  BP improved.  Respiratory failure resolved. -Continue IV Lasix at 40 mg daily -Resume home ARB. -Follow echocardiogram -Strict intake and output and daily weight -Cleared for regular diet by SLP. -PT/OT   Uncontrolled hypertension/hypertensive urgency: Missed his meds for 2 days.  BP elevated but improved. -Avapro instead of home valsartan at reduced dose while on IV Lasix. 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 Tresa Moore  BPH -Continue home BPH meds -Defer to urology.  Acute blood loss anemia superimposed on anemia of chronic disease?  Hemoglobin dropped to 7.1.  No signs of bleeding anywhere. Recent Labs    02/16/22 2019 03/13/22 1432 03/16/22 0300  HGB 9.1* 8.6* 7.1*  -Recheck CBC this afternoon -Check anemia panel -Verbally consented for blood transfusion if indicated -Continue SCD for VTE prophylaxis  CKD-3B: Stable. Recent Labs     02/16/22 2019 03/13/22 1432 03/16/22 0300  BUN 19 22 32*  CREATININE 2.14* 1.74* 1.98*  -Continue monitoring  Alcohol use disorder/tobacco use disorder/marijuana use -Encouraged cessation. -Monitor for withdrawal  GERD -P.o. Protonix.  Body mass index is 28.52 kg/m.           DVT prophylaxis:  SCDs Start: 03/15/22 1555  Code Status: Full code Family Communication: Updated patient's daughter at bedside Level of care: Telemetry Status is: Inpatient Remains inpatient appropriate because: Acute CHF and acute blood loss anemia   Final disposition: TBD Consultants:  Urology  Sch Meds:  Scheduled Meds:  carvedilol  6.25 mg Oral BID WC   Chlorhexidine Gluconate Cloth  6 each Topical Daily   finasteride  5 mg Oral Daily   [START ON 03/17/2022] furosemide  40 mg Intravenous Daily   gabapentin  300 mg Oral BID   irbesartan  150 mg Oral Daily   pantoprazole  40 mg Oral Daily   rosuvastatin  5 mg Oral Daily   Continuous Infusions: PRN Meds:.HYDROmorphone (DILAUDID) injection, labetalol, mouth rinse  Antimicrobials: Anti-infectives (From admission, onward)    Start     Dose/Rate Route Frequency Ordered Stop   03/15/22 1020  ceFAZolin (ANCEF) IVPB 2g/100 mL premix        2 g 200 mL/hr over 30 Minutes Intravenous 30 min pre-op 03/15/22 1020 03/15/22 1224   03/15/22 0000  sulfamethoxazole-trimethoprim (BACTRIM DS) 800-160 MG tablet        1 tablet Oral Daily 03/15/22 1310          I have personally reviewed the following labs and images: CBC: Recent Labs  Lab 03/13/22 1432 03/16/22 0300  WBC 6.0 7.7  HGB 8.6* 7.1*  HCT 27.5* 22.8*  MCV 93.2 92.7  PLT 259 289   BMP &GFR Recent Labs  Lab 03/13/22 1432 03/16/22 0300  NA 140 140  K 4.3 4.6  CL 110 108  CO2 24 23  GLUCOSE 88 114*  BUN 22 32*  CREATININE 1.74* 1.98*  CALCIUM 9.6 9.0  MG  --  2.0  PHOS  --  4.5   Estimated Creatinine Clearance: 38.6 mL/min (A) (by C-G formula based on SCr of 1.98  mg/dL (H)). Liver & Pancreas: Recent Labs  Lab 03/16/22 0300  ALBUMIN 3.2*   No results for input(s): "LIPASE", "AMYLASE" in the last 168 hours. No results for input(s): "AMMONIA" in the last 168 hours. Diabetic: No results for input(s): "HGBA1C" in the last 72 hours. No results for input(s): "GLUCAP" in the last 168 hours. Cardiac Enzymes: No results for input(s): "CKTOTAL", "CKMB", "CKMBINDEX", "TROPONINI" in the last 168 hours. No results for input(s): "PROBNP" in the last 8760 hours. Coagulation Profile: No results for input(s): "INR", "PROTIME" in the last 168 hours. Thyroid Function Tests: No results for input(s): "TSH", "T4TOTAL", "FREET4", "T3FREE", "THYROIDAB" in the last 72 hours. Lipid Profile: No results for input(s): "CHOL", "HDL", "LDLCALC", "TRIG", "CHOLHDL", "LDLDIRECT" in the last 72 hours. Anemia Panel: No results for input(s): "VITAMINB12", "FOLATE", "FERRITIN", "TIBC", "IRON", "RETICCTPCT" in the last 72  hours. Urine analysis:    Component Value Date/Time   COLORURINE YELLOW 02/08/2013 [place] 02/08/2013 1555   LABSPEC >1.046 (H) 02/08/2013 1555   PHURINE 5.0 02/08/2013 1555   GLUCOSEU NEGATIVE 02/08/2013 1555   HGBUR NEGATIVE 02/08/2013 1555   BILIRUBINUR neg 05/23/2013 [place] 02/08/2013 1555   PROTEINUR 100 05/23/2013 1633   PROTEINUR NEGATIVE 02/08/2013 1555   UROBILINOGEN 0.2 05/23/2013 1633   UROBILINOGEN 0.2 02/08/2013 1555   NITRITE neg 05/23/2013 1633   NITRITE NEGATIVE 02/08/2013 1555   LEUKOCYTESUR Trace 05/23/2013 1633   Sepsis Labs: Invalid input(s): "PROCALCITONIN", "LACTICIDVEN"  Microbiology: Recent Results (from the past 240 hour(s))  MRSA Next Gen by PCR, Nasal     Status: None   Collection Time: 03/15/22  5:25 PM   Specimen: Nasal Mucosa; Nasal Swab  Result Value Ref Range Status   MRSA by PCR Next Gen NOT DETECTED NOT DETECTED Final    Comment: (NOTE) The GeneXpert MRSA Assay (FDA approved  for NASAL specimens only), is one component of a comprehensive MRSA colonization surveillance program. It is not intended to diagnose MRSA infection nor to guide or monitor treatment for MRSA infections. Test performance is not FDA approved in patients less than 58 years old. Performed at [facility], [place] [address], [place], [place] [address]     Radiology Studies: DG Chest Port 1 View  Result Date: 03/16/2022 CLINICAL DATA:  CHF EXAM: PORTABLE CHEST 1 VIEW COMPARISON:  Portable exam 1055 hours compared to 03/15/2022 FINDINGS: Enlargement of cardiac silhouette with pulmonary vascular congestion. Mediastinal contours normal. Improved pulmonary edema since prior study. No pleural effusion or pneumothorax. Osseous structures unremarkable. IMPRESSION: Improved CHF. Electronically Signed   By: Lavonia Dana M.D.   On: 03/16/2022 11:38   DG CHEST PORT 1 VIEW  Result Date: 03/15/2022 CLINICAL DATA:  Hypoxia, postop right orchiectomy. EXAM: PORTABLE CHEST 1 VIEW COMPARISON:  02/08/2013. FINDINGS: Trachea is midline. Heart size is accentuated by AP technique. Diffuse hazy airspace opacification bilaterally. There may be peripheral septal lines in the right costophrenic angle. Trace bilateral pleural effusions. IMPRESSION: Probable congestive heart failure.  Difficult to exclude aspiration. Electronically Signed   By: Lorin Picket M.D.   On: 03/15/2022 14:32      Amika Tassin T. [place]  If 7PM-7AM, please contact night-coverage [email or web address] 03/16/2022, 12:06 PM

## 2022-03-16 NOTE — Progress Notes (Signed)
Echocardiogram 2D Echocardiogram has been attempted. Pt in chair, nurse asked to come back later. Will attempt again as time permits.  Seth Santana 03/16/2022, 11:05 AM

## 2022-03-17 DIAGNOSIS — I5031 Acute diastolic (congestive) heart failure: Secondary | ICD-10-CM

## 2022-03-17 DIAGNOSIS — J9601 Acute respiratory failure with hypoxia: Secondary | ICD-10-CM | POA: Diagnosis not present

## 2022-03-17 DIAGNOSIS — N401 Enlarged prostate with lower urinary tract symptoms: Secondary | ICD-10-CM | POA: Diagnosis not present

## 2022-03-17 DIAGNOSIS — I10 Essential (primary) hypertension: Secondary | ICD-10-CM | POA: Diagnosis not present

## 2022-03-17 DIAGNOSIS — D509 Iron deficiency anemia, unspecified: Secondary | ICD-10-CM

## 2022-03-17 LAB — RENAL FUNCTION PANEL
Albumin: 3.3 g/dL — ABNORMAL LOW (ref 3.5–5.0)
Anion gap: 7 (ref 5–15)
BUN: 32 mg/dL — ABNORMAL HIGH (ref 8–23)
CO2: 22 mmol/L (ref 22–32)
Calcium: 9 mg/dL (ref 8.9–10.3)
Chloride: 104 mmol/L (ref 98–111)
Creatinine, Ser: 1.96 mg/dL — ABNORMAL HIGH (ref 0.61–1.24)
GFR, Estimated: 36 mL/min — ABNORMAL LOW (ref >60)
Glucose, Bld: 127 mg/dL — ABNORMAL HIGH (ref 70–99)
Phosphorus: 3.3 mg/dL (ref 2.5–4.6)
Potassium: 3.8 mmol/L (ref 3.5–5.1)
Sodium: 133 mmol/L — ABNORMAL LOW (ref 135–145)

## 2022-03-17 LAB — CBC
HCT: 25.7 % — ABNORMAL LOW (ref 39.0–52.0)
Hemoglobin: 8.1 g/dL — ABNORMAL LOW (ref 13.0–17.0)
MCH: 29.2 pg (ref 26.0–34.0)
MCHC: 31.5 g/dL (ref 30.0–36.0)
MCV: 92.8 fL (ref 80.0–100.0)
Platelets: 267 10*3/uL (ref 150–400)
RBC: 2.77 MIL/uL — ABNORMAL LOW (ref 4.22–5.81)
RDW: 14.5 % (ref 11.5–15.5)
WBC: 9.7 10*3/uL (ref 4.0–10.5)
nRBC: 0 % (ref 0.0–0.2)

## 2022-03-17 LAB — TYPE AND SCREEN
ABO/RH(D): O POS
Antibody Screen: NEGATIVE
Unit division: 0

## 2022-03-17 LAB — BPAM RBC
Blood Product Expiration Date: 202401032359
ISSUE DATE / TIME: 202312071643
Unit Type and Rh: 5100

## 2022-03-17 LAB — MAGNESIUM: Magnesium: 2.1 mg/dL (ref 1.7–2.4)

## 2022-03-17 MED ORDER — LOSARTAN POTASSIUM 25 MG PO TABS: 25.0000 mg | ORAL_TABLET | Freq: Every day | ORAL | 0 refills | Status: DC

## 2022-03-17 MED ORDER — IRBESARTAN 75 MG PO TABS
75.0000 mg | ORAL_TABLET | Freq: Every day | ORAL | Status: DC
  Administered 2022-03-17: 75 mg via ORAL
  Filled 2022-03-17: qty 1

## 2022-03-17 MED ORDER — CARVEDILOL 12.5 MG PO TABS
12.5000 mg | ORAL_TABLET | Freq: Two times a day (BID) | ORAL | Status: DC
  Administered 2022-03-17: 12.5 mg via ORAL
  Filled 2022-03-17: qty 1

## 2022-03-17 MED ORDER — FERROUS SULFATE 325 (65 FE) MG PO TBEC: 325.0000 mg | DELAYED_RELEASE_TABLET | Freq: Two times a day (BID) | ORAL | 1 refills | Status: DC

## 2022-03-17 MED ORDER — CARVEDILOL 12.5 MG PO TABS: 12.5000 mg | ORAL_TABLET | Freq: Two times a day (BID) | ORAL | 0 refills | Status: DC

## 2022-03-17 MED ORDER — FUROSEMIDE 40 MG PO TABS: 40.0000 mg | ORAL_TABLET | Freq: Every day | ORAL | 0 refills | Status: DC

## 2022-03-17 NOTE — TOC Progression Note (Signed)
Transition of Care [facility]) - Progression Note    Patient Details  Name: Seth Santana MRN: [id number] Date of Birth: Nov 24, 1950  Transition of Care [facility]) CM/SW Contact  Henrietta Dine, RN Phone Number: 03/17/2022, 10:21 AM  Clinical Narrative:     TOC order received for Heart Failure Home Health Screening. Patient has been reviewed and does not meet criteria.   Expected Discharge Plan: Home/Self Care Barriers to Discharge: Continued Medical Work up  Expected Discharge Plan and Services Expected Discharge Plan: Home/Self Care   Discharge Planning Services: CM Consult   Living arrangements for the past 2 months: No permanent address Expected Discharge Date: 03/17/22                                     Social Determinants of Health (SDOH) Interventions    Readmission Risk Interventions     No data to display

## 2022-03-17 NOTE — TOC Transition Note (Signed)
Transition of Care [facility]) - CM/SW Discharge Note   Patient Details  Name: Seth Santana MRN: [id number] Date of Birth: April 29, 1950  Transition of Care [facility]) CM/SW Contact:  Henrietta Dine, RN Phone Number: 03/17/2022, 10:28 AM   Clinical Narrative:    Spoke w/ pt and dtr in room; the pt says he is going home w/ his dtr at d/c; the pt says he wears bilateral HA, and reading glasses; his dtr says he has no DME or [facility] services; she will transport the pt home; no TOC needs.     Barriers to Discharge: No Barriers Identified   Patient Goals and CMS Choice Patient states their goals for this hospitalization and ongoing recovery are:: to go home CMS Medicare.gov Compare Post Acute Care list provided to:: Patient Choice offered to / list presented to : Patient  Discharge Placement                       Discharge Plan and Services   Discharge Planning Services: CM Consult                                 Social Determinants of Health (SDOH) Interventions     Readmission Risk Interventions     No data to display

## 2022-03-17 NOTE — Discharge Summary (Signed)
Physician Discharge Summary  PASTOR [name]:[id number] DOB: 1950/09/04 DOA: 03/15/2022  PCP: [name], DO  Admit date: 03/15/2022 Discharge date: 03/17/2022 Admitted From: Home Disposition: Home Recommendations for Outpatient Follow-up:  Follow up with PCP in 1 week.  Patient has upcoming appointment Outpatient follow-up with urology as below Recommend referral to nephrology Check BP, fluid status, CMP and CBC at follow-up Please follow up on the following pending results: None  Home Health: Not indicated Equipment/Devices: Not indicated  Discharge Condition: Stable CODE STATUS: Full code  Follow-up Information     Seth Frock, MD Follow up on 03/30/2022.   Specialty: Urology Why: at 10:45 for MD visit and pathology reveiw. Contact information: [place] [address] [phone]         [facility] Follow up on 03/20/2022.   Why: for nurse visit and office drain removal.  Office will call to arrange time. Contact information: [place] [phone]                Hospital course 71 year old M with history of right testicular mass, left renal angiomyolipoma, BPH, CKD-3B, LE edema and diminished hearing who underwent right radical inguinal orchiectomy by Dr. Tresa Moore and developed respiratory failure with hypoxia postoperatively requiring BiPAP, and admitted for acute respiratory failure with hypoxia due to acute CHF.  Patient desaturated to 77% on 12 L by NRB requiring BiPAP.  He was hypotensive with SBP in 190s and DBP in 110s.  Chest x-ray suggested CHF.  BNP slightly elevated.  Started on IV Lasix.    TTE with LVEF of 55 to 60% and G2 DD.  Patient's respiratory failure resolved with IV Lasix.  He was liberated off BiPAP to nasal cannula and then to room air.  Repeat chest x-ray with improved aeration.  He is discharged on p.o. Lasix 40 mg daily.  Given his renal function and  risk of AKI, we stopped his valsartan and started Coreg with low-dose losartan.  Patient may benefit from referral to nephrology given his CKD and anemia.  He was counseled on the importance of compliance with medication and sodium and fluid restriction.  See individual problem list below for more.   Problems addressed during this hospitalization Principal Problem:   Acute respiratory failure with hypoxia (HCC) Active Problems:   Sensorineural hearing loss (SNHL) of both ears   Acute diastolic CHF (congestive heart failure) (HCC)   Chronic kidney disease, stage 3b (HCC)   Testicular mass   BPH (benign prostatic hyperplasia)   Uncontrolled hypertension   Acute respiratory failure with hypoxia due to acute diastolic CHF: Reportedly did not take his BP meds in 2 days.  Has been out of his Lasix for over a week.  Desaturated to 77% on 12 L by NRB requiring BiPAP.  CXR concerning for CHF.  No history of CHF.  BNP 270.  TTE with LVEF of 55 to 60% and G2 DD.  Patient was started on IV Lasix and respiratory failure resolved.  Repeat chest x-ray with improved CHF.  BP improved.  Creatinine is stable. -Discharged on p.o. Lasix 40 mg daily -Discontinued valsartan and started low-dose losartan. -P.o. Coreg 12.5 mg twice daily -Counseled on the importance of compliance, fluid and sodium restriction   Uncontrolled hypertension/hypertensive urgency: Missed his meds for 2 days.  BP improved. -Cardiac meds as above.   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 Tresa Moore BPH without urinary  symptoms -Continue home BPH meds -P.o. Bactrim for 5 days per urology -Outpatient follow-up with urology   ABLA superimposed on anemia of chronic disease:  Hemoglobin dropped to 7.1.  No signs of bleeding anywhere.  Anemia panel with iron deficiency.  Transfused with appropriate response. Recent Labs    02/16/22 2019 03/13/22 1432 03/16/22 0300 03/16/22 1159 03/17/22 0641  HGB 9.1* 8.6* 7.1*  6.8* 8.1*  -P.o. ferrous sulfate with bowel regimen -Recommend referral to nephrology given his CKD -Recheck CBC in 1 week   CKD-3B: Stable. -Adjusted cardiac meds as above -Recommend referral to nephrology   Alcohol use disorder/tobacco use disorder/marijuana use -Encouraged cessation. -Monitor for withdrawal   GERD -Continue PPI           Vital signs Vitals:   03/16/22 2023 03/17/22 0529 03/17/22 0533 03/17/22 0837  BP: (!) 160/75 (!) 155/82  (!) 153/75  Pulse: 90 85  75  Temp: 99.7 F (37.6 C) 98.2 F (36.8 C)  98.7 F (37.1 C)  Resp: '18 16  17  '$ Height:      Weight:   88.5 kg   SpO2: 96% 95%  100%  TempSrc: Oral Oral    BMI (Calculated):   28.8      Discharge exam  GENERAL: No apparent distress.  Nontoxic. HEENT: MMM.  Vision and hearing grossly intact.  NECK: Supple.  No apparent JVD.  RESP:  No IWOB.  Fair aeration bilaterally. CVS:  RRR. Heart sounds normal.  ABD/GI/GU: BS+. Abd soft, NTND.  MSK/EXT:  Moves extremities. No apparent deformity. No edema.  SKIN: Right groin surgical wound DCI. NEURO: Awake and alert. Oriented appropriately.  No apparent focal neuro deficit. PSYCH: Calm. Normal affect.   Discharge Instructions Discharge Instructions     (HEART FAILURE PATIENTS) Call MD:  Anytime you have any of the following symptoms: 1) 3 pound weight gain in 24 hours or 5 pounds in 1 week 2) shortness of breath, with or without a dry hacking cough 3) swelling in the hands, feet or stomach 4) if you have to sleep on extra pillows at night in order to breathe.   Complete by: As directed    Call MD for:  difficulty breathing, headache or visual disturbances   Complete by: As directed    Call MD for:  extreme fatigue   Complete by: As directed    Call MD for:  persistant dizziness or light-headedness   Complete by: As directed    Call MD for:  temperature >100.4   Complete by: As directed    Diet - low sodium heart healthy   Complete by: As directed     Discharge instructions   Complete by: As directed    It has been a pleasure taking care of you!  You were hospitalized due to heart failure and elevated blood pressure for which you have been treated with fluid medication (furosemide) and blood pressure medications.  Please review your new medication list and the directions on your medications before you take them.  Follow-up with your primary care doctor as scheduled.  Follow-up with urology per their recommendation.  In addition to taking your medications as prescribed, we also recommend you avoid alcohol or over-the-counter pain medication other than plain Tylenol, limit the amount of water/fluid you drink to less than 6 cups (1500 cc) a day,  limit your sodium (salt) intake to less than 2 g (2000 mg) a day and weigh yourself daily at the same time and keeping your  weight log.     Take care,   Increase activity slowly   Complete by: As directed    No wound care   Complete by: As directed       Allergies as of 03/17/2022   No Known Allergies      Medication List     STOP taking these medications    traMADol 50 MG tablet Commonly known as: ULTRAM   valsartan 320 MG tablet Commonly known as: DIOVAN       TAKE these medications    acetaminophen 500 MG tablet Commonly known as: TYLENOL Take 500 mg by mouth every 6 (six) hours as needed for moderate pain.   B-12 PO Take 1 tablet by mouth daily as needed (energy).   carvedilol 12.5 MG tablet Commonly known as: COREG Take 1 tablet (12.5 mg total) by mouth 2 (two) times daily with a meal.   ferrous sulfate 325 (65 FE) MG EC tablet Take 1 tablet (325 mg total) by mouth 2 (two) times daily.   FIBER PO Take 1 tablet by mouth daily as needed (constipation).   finasteride 5 MG tablet Commonly known as: PROSCAR Take 5 mg by mouth daily.   furosemide 40 MG tablet Commonly known as: Lasix Take 1 tablet (40 mg total) by mouth daily. What changed:  medication  strength how much to take   gabapentin 300 MG capsule Commonly known as: NEURONTIN Take 300 mg by mouth 2 (two) times daily.   hydrOXYzine 10 MG tablet Commonly known as: ATARAX Take 10 mg by mouth every 8 (eight) hours as needed for itching.   losartan 25 MG tablet Commonly known as: Cozaar Take 1 tablet (25 mg total) by mouth daily.   Mens 50+ Multivitamin Tabs Take 1 tablet by mouth daily.   omeprazole 40 MG capsule Commonly known as: PRILOSEC Take 40 mg by mouth daily.   oxyCODONE 5 MG immediate release tablet Commonly known as: Roxicodone Take 1 tablet (5 mg total) by mouth every 6 (six) hours as needed for moderate pain or severe pain (post-operatively).   rosuvastatin 5 MG tablet Commonly known as: CRESTOR Take 5 mg by mouth daily.   senna-docusate 8.6-50 MG tablet Commonly known as: Senokot-S Take 1 tablet by mouth 2 (two) times daily. While taking strong pain meds to prevent constipation   sulfamethoxazole-trimethoprim 800-160 MG tablet Commonly known as: BACTRIM DS Take 1 tablet by mouth daily. X 5 days to prevent infection with drain in place.   triamcinolone ointment 0.1 % Commonly known as: KENALOG Apply 1 Application topically 2 (two) times daily as needed (itching).        Consultations: Urology  Procedures/Studies:   ECHOCARDIOGRAM COMPLETE  Result Date: 03/16/2022    ECHOCARDIOGRAM REPORT   Patient Name:   Seth Santana Date of Exam: 03/16/2022 Medical Rec #:  [id number]        Height:       69.0 in Accession #:    [id number]       Weight:       193.1 lb Date of Birth:  Feb 10, 1951        BSA:          2.036 m Patient Age:    19 years         BP:           164/77 mmHg Patient Gender: M                HR:  84 bpm. Exam Location:  Inpatient Procedure: 2D Echo, Cardiac Doppler and Color Doppler Indications:    CHF  History:        Patient has no prior history of Echocardiogram examinations.                 CHF; Risk Factors:Hypertension.   Sonographer:    Wenda Low Referring Phys: [id number] Eliah Marquard T Jessica Checketts IMPRESSIONS  1. Left ventricular ejection fraction, by estimation, is 55 to 60%. The left ventricle has normal function. The left ventricle has no regional wall motion abnormalities. There is severe concentric left ventricular hypertrophy. Left ventricular diastolic  parameters are consistent with Grade II diastolic dysfunction (pseudonormalization).  2. Right ventricular systolic function is normal. The right ventricular size is normal. There is normal pulmonary artery systolic pressure.  3. Left atrial size was mildly dilated.  4. Right atrial size was mildly dilated.  5. The mitral valve is normal in structure. Mild mitral valve regurgitation. No evidence of mitral stenosis.  6. The aortic valve is normal in structure. Aortic valve regurgitation is not visualized. No aortic stenosis is present.  7. There is borderline dilatation of the ascending aorta, measuring 37 mm. There is moderate dilatation of the aortic root, measuring 40 mm.  8. The inferior vena cava is normal in size with greater than 50% respiratory variability, suggesting right atrial pressure of 3 mmHg. FINDINGS  Left Ventricle: Left ventricular ejection fraction, by estimation, is 55 to 60%. The left ventricle has normal function. The left ventricle has no regional wall motion abnormalities. The left ventricular internal cavity size was normal in size. There is  severe concentric left ventricular hypertrophy. Left ventricular diastolic parameters are consistent with Grade II diastolic dysfunction (pseudonormalization). Right Ventricle: The right ventricular size is normal. No increase in right ventricular wall thickness. Right ventricular systolic function is normal. There is normal pulmonary artery systolic pressure. The tricuspid regurgitant velocity is 2.61 m/s, and  with an assumed right atrial pressure of 3 mmHg, the estimated right ventricular systolic pressure is 98.3  mmHg. Left Atrium: Left atrial size was mildly dilated. Right Atrium: Right atrial size was mildly dilated. Pericardium: There is no evidence of pericardial effusion. Mitral Valve: The mitral valve is normal in structure. Mild mitral valve regurgitation. No evidence of mitral valve stenosis. MV peak gradient, 4.7 mmHg. The mean mitral valve gradient is 2.0 mmHg. Tricuspid Valve: The tricuspid valve is normal in structure. Tricuspid valve regurgitation is not demonstrated. No evidence of tricuspid stenosis. Aortic Valve: The aortic valve is normal in structure. Aortic valve regurgitation is not visualized. No aortic stenosis is present. Aortic valve mean gradient measures 4.0 mmHg. Aortic valve peak gradient measures 8.8 mmHg. Aortic valve area, by VTI measures 2.73 cm. Pulmonic Valve: The pulmonic valve was normal in structure. Pulmonic valve regurgitation is trivial. No evidence of pulmonic stenosis. Aorta: There is borderline dilatation of the ascending aorta, measuring 37 mm. There is moderate dilatation of the aortic root, measuring 40 mm. Venous: The inferior vena cava is normal in size with greater than 50% respiratory variability, suggesting right atrial pressure of 3 mmHg. IAS/Shunts: No atrial level shunt detected by color flow Doppler.  LEFT VENTRICLE PLAX 2D LVIDd:         5.70 cm   Diastology LVIDs:         4.20 cm   LV e' medial:    5.22 cm/s LV PW:         1.50 cm  LV E/e' medial:  19.0 LV IVS:        1.70 cm   LV e' lateral:   6.96 cm/s LVOT diam:     2.10 cm   LV E/e' lateral: 14.3 LV SV:         83 LV SV Index:   41 LVOT Area:     3.46 cm  RIGHT VENTRICLE RV Basal diam:  3.55 cm RV Mid diam:    2.80 cm RV S prime:     15.60 cm/s TAPSE (M-mode): 2.8 cm LEFT ATRIUM             Index        RIGHT ATRIUM           Index LA diam:        4.50 cm 2.21 cm/m   RA Area:     23.00 cm LA Vol (A2C):   94.1 ml 46.23 ml/m  RA Volume:   69.70 ml  34.24 ml/m LA Vol (A4C):   50.9 ml 25.01 ml/m LA Biplane Vol:  69.9 ml 34.34 ml/m  AORTIC VALVE                    PULMONIC VALVE AV Area (Vmax):    2.83 cm     PV Vmax:       1.15 m/s AV Area (Vmean):   3.04 cm     PV Peak grad:  5.3 mmHg AV Area (VTI):     2.73 cm AV Vmax:           148.00 cm/s AV Vmean:          89.000 cm/s AV VTI:            0.303 m AV Peak Grad:      8.8 mmHg AV Mean Grad:      4.0 mmHg LVOT Vmax:         121.00 cm/s LVOT Vmean:        78.100 cm/s LVOT VTI:          0.239 m LVOT/AV VTI ratio: 0.79  AORTA Ao Root diam: 4.00 cm Ao Asc diam:  3.70 cm MITRAL VALVE               TRICUSPID VALVE MV Area (PHT): 3.72 cm    TR Peak grad:   27.2 mmHg MV Area VTI:   2.63 cm    TR Vmax:        261.00 cm/s MV Peak grad:  4.7 mmHg MV Mean grad:  2.0 mmHg    SHUNTS MV Vmax:       1.08 m/s    Systemic VTI:  0.24 m MV Vmean:      65.1 cm/s   Systemic Diam: 2.10 cm MV Decel Time: 204 msec MV E velocity: 99.40 cm/s MV A velocity: 85.70 cm/s MV E/A ratio:  1.16 Kardie Tobb DO Electronically signed by Berniece Salines DO Signature Date/Time: 03/16/2022/5:08:25 PM    Final    DG Chest Port 1 View  Result Date: 03/16/2022 CLINICAL DATA:  CHF EXAM: PORTABLE CHEST 1 VIEW COMPARISON:  Portable exam 1055 hours compared to 03/15/2022 FINDINGS: Enlargement of cardiac silhouette with pulmonary vascular congestion. Mediastinal contours normal. Improved pulmonary edema since prior study. No pleural effusion or pneumothorax. Osseous structures unremarkable. IMPRESSION: Improved CHF. Electronically Signed   By: Lavonia Dana M.D.   On: 03/16/2022 11:38   DG CHEST PORT 1 VIEW  Result Date: 03/15/2022 CLINICAL  DATA:  Hypoxia, postop right orchiectomy. EXAM: PORTABLE CHEST 1 VIEW COMPARISON:  02/08/2013. FINDINGS: Trachea is midline. Heart size is accentuated by AP technique. Diffuse hazy airspace opacification bilaterally. There may be peripheral septal lines in the right costophrenic angle. Trace bilateral pleural effusions. IMPRESSION: Probable congestive heart failure.  Difficult to  exclude aspiration. Electronically Signed   By: Lorin Picket M.D.   On: 03/15/2022 14:32       The results of significant diagnostics from this hospitalization (including imaging, microbiology, ancillary and laboratory) are listed below for reference.     Microbiology: Recent Results (from the past 240 hour(s))  MRSA Next Gen by PCR, Nasal     Status: None   Collection Time: 03/15/22  5:25 PM   Specimen: Nasal Mucosa; Nasal Swab  Result Value Ref Range Status   MRSA by PCR Next Gen NOT DETECTED NOT DETECTED Final    Comment: (NOTE) The GeneXpert MRSA Assay (FDA approved for NASAL specimens only), is one component of a comprehensive MRSA colonization surveillance program. It is not intended to diagnose MRSA infection nor to guide or monitor treatment for MRSA infections. Test performance is not FDA approved in patients less than 52 years old. Performed at [facility], [place] [address] [place], [place] [address]      Labs:  CBC: Recent Labs  Lab 03/13/22 1432 03/16/22 0300 03/16/22 1159 03/17/22 0641  WBC 6.0 7.7 8.3 9.7  HGB 8.6* 7.1* 6.8* 8.1*  HCT 27.5* 22.8* 22.3* 25.7*  MCV 93.2 92.7 93.7 92.8  PLT 259 289 258 267   BMP &GFR Recent Labs  Lab 03/13/22 1432 03/16/22 0300 03/17/22 0641  NA 140 140 133*  K 4.3 4.6 3.8  CL 110 108 104  CO2 '24 23 22  '$ GLUCOSE 88 114* 127*  BUN 22 32* 32*  CREATININE 1.74* 1.98* 1.96*  CALCIUM 9.6 9.0 9.0  MG  --  2.0 2.1  PHOS  --  4.5 3.3   Estimated Creatinine Clearance: 39.1 mL/min (A) (by C-G formula based on SCr of 1.96 mg/dL (H)). Liver & Pancreas: Recent Labs  Lab 03/16/22 0300 03/17/22 0641  ALBUMIN 3.2* 3.3*   No results for input(s): "LIPASE", "AMYLASE" in the last 168 hours. No results for input(s): "AMMONIA" in the last 168 hours. Diabetic: No results for input(s): "HGBA1C" in the last 72 hours. No results for input(s): "GLUCAP" in the last 168 hours. Cardiac Enzymes: No results for  input(s): "CKTOTAL", "CKMB", "CKMBINDEX", "TROPONINI" in the last 168 hours. No results for input(s): "PROBNP" in the last 8760 hours. Coagulation Profile: No results for input(s): "INR", "PROTIME" in the last 168 hours. Thyroid Function Tests: No results for input(s): "TSH", "T4TOTAL", "FREET4", "T3FREE", "THYROIDAB" in the last 72 hours. Lipid Profile: No results for input(s): "CHOL", "HDL", "LDLCALC", "TRIG", "CHOLHDL", "LDLDIRECT" in the last 72 hours. Anemia Panel: Recent Labs    03/16/22 1159  VITAMINB12 395  FOLATE 13.6  FERRITIN 62  TIBC 281  IRON 24*  RETICCTPCT 2.3   Urine analysis:    Component Value Date/Time   COLORURINE YELLOW 02/08/2013 1555   APPEARANCEUR CLEAR 02/08/2013 1555   LABSPEC >1.046 (H) 02/08/2013 1555   PHURINE 5.0 02/08/2013 1555   GLUCOSEU NEGATIVE 02/08/2013 1555   HGBUR NEGATIVE 02/08/2013 1555   BILIRUBINUR neg 05/23/2013 [place] 02/08/2013 1555   PROTEINUR 100 05/23/2013 1633   PROTEINUR NEGATIVE 02/08/2013 1555   UROBILINOGEN 0.2 05/23/2013 1633   UROBILINOGEN 0.2 02/08/2013 1555  NITRITE neg 05/23/2013 1633   NITRITE NEGATIVE 02/08/2013 1555   LEUKOCYTESUR Trace 05/23/2013 1633   Sepsis Labs: Invalid input(s): "PROCALCITONIN", "LACTICIDVEN"   SIGNED:  Mercy Riding, MD  [facility] 03/17/2022, 2:06 PM

## 2022-03-20 ENCOUNTER — Telehealth: Payer: Self-pay

## 2022-03-20 NOTE — Telephone Encounter (Signed)
Transition Care Management Follow-up Telephone Call Date of discharge and from where: [facility] 03/17/2022 How have you been since you were released from the hospital? better Any questions or concerns? No  Items Reviewed: Did the pt receive and understand the discharge instructions provided? Yes  Medications obtained and verified? Yes  Other? No  Any new allergies since your discharge? No  Dietary orders reviewed? Yes Do you have support at home? Yes   Home Care and Equipment/Supplies: Were home health services ordered? no If so, what is the name of the agency? N/a  Has the agency set up a time to come to the patient's home? not applicable Were any new equipment or medical supplies ordered?  No What is the name of the medical supply agency? N/a Were you able to get the supplies/equipment? not applicable Do you have any questions related to the use of the equipment or supplies? No  Functional Questionnaire: (I = Independent and D = Dependent) ADLs: I  Bathing/Dressing- I  Meal Prep- I  Eating- I  Maintaining continence- I  Transferring/Ambulation- I  Managing Meds- I  Follow up appointments reviewed:  PCP Hospital f/u appt confirmed? Yes  Scheduled to see Dr Raymondo Band on 03/22/2022 @ 1:45. [place] [facility] f/u appt confirmed? Yes  Scheduled to see Dr Lorn Junes on 03/20/2022 @ 1:30. Are transportation arrangements needed? No  If their condition worsens, is the pt aware to call PCP or go to the Emergency Dept.? Yes Was the patient provided with contact information for the PCP's office or ED? Yes Was to pt encouraged to call back with questions or concerns? Yes Seth Crumble, LPN [place] [facility] [phone]

## 2022-03-22 ENCOUNTER — Ambulatory Visit (INDEPENDENT_AMBULATORY_CARE_PROVIDER_SITE_OTHER): Payer: Medicare Other | Admitting: Internal Medicine

## 2022-03-22 VITALS — BP 147/70 | HR 75 | Temp 98.3°F | Ht 69.0 in | Wt 195.9 lb

## 2022-03-22 DIAGNOSIS — N1832 Chronic kidney disease, stage 3b: Secondary | ICD-10-CM | POA: Diagnosis not present

## 2022-03-22 DIAGNOSIS — N189 Chronic kidney disease, unspecified: Secondary | ICD-10-CM | POA: Diagnosis not present

## 2022-03-22 DIAGNOSIS — R7303 Prediabetes: Secondary | ICD-10-CM | POA: Diagnosis not present

## 2022-03-22 DIAGNOSIS — Z862 Personal history of diseases of the blood and blood-forming organs and certain disorders involving the immune mechanism: Secondary | ICD-10-CM | POA: Diagnosis not present

## 2022-03-22 DIAGNOSIS — D631 Anemia in chronic kidney disease: Secondary | ICD-10-CM | POA: Diagnosis not present

## 2022-03-22 DIAGNOSIS — E119 Type 2 diabetes mellitus without complications: Secondary | ICD-10-CM | POA: Insufficient documentation

## 2022-03-22 DIAGNOSIS — E669 Obesity, unspecified: Secondary | ICD-10-CM | POA: Diagnosis not present

## 2022-03-22 DIAGNOSIS — N5089 Other specified disorders of the male genital organs: Secondary | ICD-10-CM

## 2022-03-22 DIAGNOSIS — I10 Essential (primary) hypertension: Secondary | ICD-10-CM

## 2022-03-22 DIAGNOSIS — F17210 Nicotine dependence, cigarettes, uncomplicated: Secondary | ICD-10-CM

## 2022-03-22 DIAGNOSIS — I129 Hypertensive chronic kidney disease with stage 1 through stage 4 chronic kidney disease, or unspecified chronic kidney disease: Secondary | ICD-10-CM | POA: Diagnosis not present

## 2022-03-22 LAB — POCT GLYCOSYLATED HEMOGLOBIN (HGB A1C): Hemoglobin A1C: 5.7 % — AB (ref 4.0–5.6)

## 2022-03-22 LAB — GLUCOSE, CAPILLARY: Glucose-Capillary: 98 mg/dL (ref 70–99)

## 2022-03-22 NOTE — Assessment & Plan Note (Addendum)
The patient had previously been following up with urology for testicular mass and was hospitalized last week for a right radical inguinal orchiectomy.  The surgery went well and the patient was discharged with a drain, which was removed 2 days ago and Dr. Zettie Pho office (urology).   The patient's hospitalization was complicated by acute hypoxic respiratory failure which was thought to be secondary to volume overload/HFpEF.  He initially required BiPAP and oxygen requirements improved with IV diuresis.  He was titrated down to nasal cannula, and then eventually room air.

## 2022-03-22 NOTE — Assessment & Plan Note (Addendum)
Blood pressure today is elevated to 147/70, but the patient notes that he walked all the way from the parking lot to the clinic.  He takes losartan 25 mg daily and carvedilol 12.5 mg twice daily.  He was previously on amlodipine for his blood pressure, but this was discontinued when he experienced a significant lower extremity swelling.  The swelling improved upon the discontinuation of the medicine.  Plan: -Continue Coreg 12.5 mg twice daily - Continue losartan 25 mg daily, could increase in future if need further blood pressure control (also for chronic kidney disease)

## 2022-03-22 NOTE — Patient Instructions (Signed)
Thank you, Mr.Eagle S Litsey for allowing [place] to provide your care today. Today we discussed:  Kidney disease: we are rechecking your kidney function and your blood level today. We are also getting a urine test to check your protein level in your urine Referral sent in to a kidney doctor We are checking you for diabetes When you need refills of your medicines, let us know  I have ordered the following labs for you:   Lab Orders         CBC no Diff         Microalbumin / Creatinine Urine Ratio         POC Hbg A1C       Referrals ordered today:    Referral Orders         Ambulatory referral to Nephrology      I have ordered the following medication/changed the following medications:   Stop the following medications: Medications Discontinued During This Encounter  Medication Reason   losartan (COZAAR) 25 MG tablet    sulfamethoxazole-trimethoprim (BACTRIM DS) 800-160 MG tablet      Start the following medications: No orders of the defined types were placed in this encounter.    Follow up: 2-3 months   Should you have any questions or concerns please call the internal medicine clinic at [phone].     Buddy Duty, D.O. [place]

## 2022-03-22 NOTE — Assessment & Plan Note (Addendum)
Patient has a history of anemia dating back to 2015, where his hemoglobin was 11 at that time.  In November, Hb was 8.6-9.1 prior to surgery and dropped below 7 after surgery, requiring the patient to receive a blood transfusion.  He was discharged and advised to take iron supplements, which she has been doing daily.  Patient denies any melena, hematochezia, or hematuria.  He does endorse some slight bleeding from his surgery site, and had brought this up with his urologist.  Plan: -CBC today

## 2022-03-22 NOTE — Assessment & Plan Note (Addendum)
The patient presents to the [facility] to establish care.  He states he has been told he has chronic kidney disease and that he needs to follow-up with a nephrologist.  Upon chart review, the patient's GFR has put him in the category for CKD 3B, but his baseline creatinine and GFR are unknown as we only have lab work from his hospitalization last week and 2015.  It is unclear if he does truly have CKD 3B or an AKI.  Plan: -Referral to nephrology - BMP today

## 2022-03-22 NOTE — Progress Notes (Signed)
CC: new patient  HPI:  [name] is a 71 y.o. male living with a history stated below and presents today to establish care with the [facility].  He is accompanied by his daughter, Seth Santana.  Please see problem based assessment and plan for additional details.  PMHx: HTN, HFpEF, ?CKD3b, BPH, sensorineuronal hearing loss  Meds: coreg 12.5 mg bid, finasteride 5 mg daily, lasix 40 mg daily, gabapentin 300 mg bid, losartan 25 mg daily, crestor 5 mg daily, oxycodone 5 mg q6h prn  All: NKDA  Surg Hx: R orchiectomy  Fam Hx: diabetes, unknown cancer in twin brother   Soc Hx: lives with daughter in [place]; retired from [employer] work; 1-2 pack per week for 50 yrs; 12 beers/day on average but sometimes less; no other substance use   Past Medical History:  Diagnosis Date   Blood in stool last few years   BPH (benign prostatic hyperplasia)    Hard of hearing    Headache(784.0)    Hypertension     Current Outpatient Medications on File Prior to Visit  Medication Sig Dispense Refill   acetaminophen (TYLENOL) 500 MG tablet Take 500 mg by mouth every 6 (six) hours as needed for moderate pain.     carvedilol (COREG) 12.5 MG tablet Take 1 tablet (12.5 mg total) by mouth 2 (two) times daily with a meal. 180 tablet 0   Cyanocobalamin (B-12 PO) Take 1 tablet by mouth daily as needed (energy).     ferrous sulfate 325 (65 FE) MG EC tablet Take 1 tablet (325 mg total) by mouth 2 (two) times daily. 180 tablet 1   FIBER PO Take 1 tablet by mouth daily as needed (constipation).     finasteride (PROSCAR) 5 MG tablet Take 5 mg by mouth daily.     furosemide (LASIX) 40 MG tablet Take 1 tablet (40 mg total) by mouth daily. 90 tablet 0   gabapentin (NEURONTIN) 300 MG capsule Take 300 mg by mouth 2 (two) times daily.     hydrOXYzine (ATARAX) 10 MG tablet Take 10 mg by mouth every 8 (eight) hours as needed for itching.     Multiple Vitamins-Minerals (MENS 50+ MULTIVITAMIN) TABS Take 1 tablet by mouth  daily.     omeprazole (PRILOSEC) 40 MG capsule Take 40 mg by mouth daily.     oxyCODONE (ROXICODONE) 5 MG immediate release tablet Take 1 tablet (5 mg total) by mouth every 6 (six) hours as needed for moderate pain or severe pain (post-operatively). 15 tablet 0   rosuvastatin (CRESTOR) 5 MG tablet Take 5 mg by mouth daily.     senna-docusate (SENOKOT-S) 8.6-50 MG tablet Take 1 tablet by mouth 2 (two) times daily. While taking strong pain meds to prevent constipation 10 tablet 0   triamcinolone ointment (KENALOG) 0.1 % Apply 1 Application topically 2 (two) times daily as needed (itching).     No current facility-administered medications on file prior to visit.    Family History  Problem Relation Age of Onset   Kidney disease Mother    Diabetes Mother    Cancer Brother     Social History   Socioeconomic History   Marital status: Single    Spouse name: Not on file   Number of children: Not on file   Years of education: Not on file   Highest education level: Not on file  Occupational History   Not on file  Tobacco Use   Smoking status: Every Day    Packs/day:  0.25    Years: 30.00    Total pack years: 7.50    Types: Cigarettes   Smokeless tobacco: Never  Vaping Use   Vaping Use: Never used  Substance and Sexual Activity   Alcohol use: Yes    Comment: some days   Drug use: Yes    Types: Marijuana    Comment: occ   Sexual activity: Not on file  Other Topics Concern   Not on file  Social History Narrative   Not on file   Social Determinants of Health   Financial Resource Strain: Not on file  Food Insecurity: No Food Insecurity (03/15/2022)   Hunger Vital Sign    Worried About Running Out of Food in the Last Year: Never true    Ran Out of Food in the Last Year: Never true  Transportation Needs: No Transportation Needs (03/15/2022)   PRAPARE - Hydrologist (Medical): No    Lack of Transportation (Non-Medical): No  Physical Activity: Not on  file  Stress: Not on file  Social Connections: Not on file  Intimate Partner Violence: Not At Risk (03/15/2022)   Humiliation, Afraid, Rape, and Kick questionnaire    Fear of Current or Ex-Partner: No    Emotionally Abused: No    Physically Abused: No    Sexually Abused: No    Review of Systems: ROS negative except for what is noted on the assessment and plan.  Vitals:   03/22/22 1403  BP: (!) 147/70  Pulse: 75  Temp: 98.3 F (36.8 C)  TempSrc: Oral  SpO2: 100%  Weight: 195 lb 14.4 oz (88.9 kg)  Height: '5\' 9"'$  (1.753 m)    Physical Exam: Constitutional: well-appearing elderly male sitting in chair, in no acute distress Cardiovascular: regular rate and rhythm, no m/r/g, no LE edema  Pulmonary/Chest: normal work of breathing on room air, lungs clear to auscultation bilaterally Abdominal: soft, non-tender, non-distended MSK: normal bulk and tone Neurological: alert & oriented x 3, no focal deficit Skin: warm and dry Psych: normal mood and behavior  Assessment & Plan:   Patient discussed with Dr. Dareen Piano  Chronic kidney disease, stage 3b [facility]) The patient presents to the [facility] to establish care.  He states he has been told he has chronic kidney disease and that he needs to follow-up with a nephrologist.  Upon chart review, the patient's GFR has put him in the category for CKD 3B, but his baseline creatinine and GFR are unknown as we only have lab work from his hospitalization last week and 2015.  It is unclear if he does truly have CKD 3B or an AKI.  Plan: -Referral to nephrology - BMP today    Uncontrolled hypertension Blood pressure today is elevated to 147/70, but the patient notes that he walked all the way from the parking lot to the clinic.  He takes losartan 25 mg daily and carvedilol 12.5 mg twice daily.  He was previously on amlodipine for his blood pressure, but this was discontinued when he experienced a significant lower extremity swelling.  The swelling  improved upon the discontinuation of the medicine.  Plan: -Continue Coreg 12.5 mg twice daily - Continue losartan 25 mg daily, could increase in future if need further blood pressure control (also for chronic kidney disease)  Testicular mass The patient had previously been following up with urology for testicular mass and was hospitalized last week for a right radical inguinal orchiectomy.  The surgery went well and the patient  was discharged with a drain, which was removed 2 days ago and Dr. Zettie Pho office (urology).   The patient's hospitalization was complicated by acute hypoxic respiratory failure which was thought to be secondary to volume overload/HFpEF.  He initially required BiPAP and oxygen requirements improved with IV diuresis.  He was titrated down to nasal cannula, and then eventually room air.  Prediabetes A1c checked today and is elevated to 5.7%, consistent with diagnosis of prediabetes.  Counseled patient on diet and exercise.  Plan: -Urine micro today  History of anemia due to CKD Patient has a history of anemia dating back to 2015, where his hemoglobin was 11 at that time.  In November, Hb was 8.6-9.1 prior to surgery and dropped below 7 after surgery, requiring the patient to receive a blood transfusion.  He was discharged and advised to take iron supplements, which she has been doing daily.  Patient denies any melena, hematochezia, or hematuria.  He does endorse some slight bleeding from his surgery site, and had brought this up with his urologist.  Plan: -CBC today   Darah Simkin, D.O. [facility], PGY-2 Phone: [phone] Date 03/22/2022 Time 4:26 PM

## 2022-03-22 NOTE — Assessment & Plan Note (Addendum)
A1c checked today and is elevated to 5.7%, consistent with diagnosis of prediabetes.  Counseled patient on diet and exercise.  Plan: -Urine micro today

## 2022-03-23 ENCOUNTER — Telehealth: Payer: Self-pay | Admitting: *Deleted

## 2022-03-23 DIAGNOSIS — I872 Venous insufficiency (chronic) (peripheral): Secondary | ICD-10-CM | POA: Diagnosis not present

## 2022-03-23 LAB — CBC
Hematocrit: 26.2 % — ABNORMAL LOW (ref 37.5–51.0)
Hemoglobin: 8.6 g/dL — ABNORMAL LOW (ref 13.0–17.7)
MCH: 29.7 pg (ref 26.6–33.0)
MCHC: 32.8 g/dL (ref 31.5–35.7)
MCV: 90 fL (ref 79–97)
Platelets: 407 10*3/uL (ref 150–450)
RBC: 2.9 x10E6/uL — ABNORMAL LOW (ref 4.14–5.80)
RDW: 13.6 % (ref 11.6–15.4)
WBC: 7.5 10*3/uL (ref 3.4–10.8)

## 2022-03-23 LAB — BMP8+ANION GAP
Anion Gap: 13 mmol/L (ref 10.0–18.0)
BUN/Creatinine Ratio: 20 (ref 10–24)
BUN: 39 mg/dL — ABNORMAL HIGH (ref 8–27)
CO2: 22 mmol/L (ref 20–29)
Calcium: 9.9 mg/dL (ref 8.6–10.2)
Chloride: 105 mmol/L (ref 96–106)
Creatinine, Ser: 1.95 mg/dL — ABNORMAL HIGH (ref 0.76–1.27)
Glucose: 99 mg/dL (ref 70–99)
Potassium: 4.8 mmol/L (ref 3.5–5.2)
Sodium: 140 mmol/L (ref 134–144)
eGFR: 37 mL/min/{1.73_m2} — ABNORMAL LOW (ref >59)

## 2022-03-23 LAB — MICROALBUMIN / CREATININE URINE RATIO
Creatinine, Urine: 89.2 mg/dL
Microalb/Creat Ratio: 175 mg/g creat — ABNORMAL HIGH (ref 0–29)
Microalbumin, Urine: 156.3 ug/mL

## 2022-03-23 MED ORDER — LOSARTAN POTASSIUM 25 MG PO TABS: 25.0000 mg | ORAL_TABLET | Freq: Every day | ORAL | 0 refills | Status: DC

## 2022-03-23 NOTE — Addendum Note (Signed)
Addended by: Buddy Duty on: 03/23/2022 10:28 AM   Modules accepted: Orders

## 2022-03-23 NOTE — Telephone Encounter (Signed)
Call from pt's daughter,Seth Santana, to clarify Losartan '25mg'$ . Stated on AVS it states to continue then it stated "Medications discontinued". Not on med list. Need clarification to continue or not. Thanks  Plan: -Continue Coreg 12.5 mg twice daily - Continue losartan 25 mg daily, could increase in future if need further blood pressure control (also for chronic kidney disease) Stop the following medications:     Medications Discontinued During This Encounter  Medication Reason   losartan (COZAAR) 25 MG tablet     sulfamethoxazole-trimethoprim (BACTRIM DS) 800-160 MG tablet

## 2022-03-23 NOTE — Telephone Encounter (Signed)
Pt's daughter,Jamie, called and informed to continued Losartan 25 mg per Dr Raymondo Band.

## 2022-03-23 NOTE — Progress Notes (Signed)
Internal Medicine Clinic Attending ° °Case discussed with Dr. Atway  At the time of the visit.  We reviewed the resident’s history and exam and pertinent patient test results.  I agree with the assessment, diagnosis, and plan of care documented in the resident’s note.  °

## 2022-03-23 NOTE — Progress Notes (Signed)
Cr still elevated to 1.95 with GFR of 39 - likely that the patient has CKD3b and that this was not an AKI. Hb improved to 8.6 (was 8.1). Urine microalbumin/cr ratio slighly elevated at 175 - patient is already on an ARB, could consider SGLT2i at next office visit (patient is also pre-diabetic now).

## 2022-03-27 ENCOUNTER — Telehealth: Payer: Self-pay

## 2022-03-27 NOTE — Telephone Encounter (Signed)
Patients daughter called she stated the patient needs a referral to a kidney doctor

## 2022-03-30 DIAGNOSIS — N453 Epididymo-orchitis: Secondary | ICD-10-CM | POA: Diagnosis not present

## 2022-03-30 DIAGNOSIS — N281 Cyst of kidney, acquired: Secondary | ICD-10-CM | POA: Diagnosis not present

## 2022-04-05 ENCOUNTER — Other Ambulatory Visit: Payer: Self-pay

## 2022-04-05 MED ORDER — FINASTERIDE 5 MG PO TABS: 5.0000 mg | ORAL_TABLET | Freq: Every day | ORAL | 1 refills | Status: DC

## 2022-04-05 MED ORDER — HYDROXYZINE HCL 10 MG PO TABS: 10.0000 mg | ORAL_TABLET | Freq: Three times a day (TID) | ORAL | 1 refills | Status: DC | PRN

## 2022-04-05 MED ORDER — FUROSEMIDE 40 MG PO TABS: 40.0000 mg | ORAL_TABLET | Freq: Every day | ORAL | 1 refills | Status: DC

## 2022-04-05 MED ORDER — OMEPRAZOLE 40 MG PO CPDR: 40.0000 mg | DELAYED_RELEASE_CAPSULE | Freq: Every day | ORAL | 1 refills | Status: DC

## 2022-04-05 MED ORDER — CARVEDILOL 12.5 MG PO TABS: 12.5000 mg | ORAL_TABLET | Freq: Two times a day (BID) | ORAL | 1 refills | Status: DC

## 2022-04-05 NOTE — Telephone Encounter (Addendum)
Please send all rx to alternative pharamacy attached to this note

## 2022-05-01 ENCOUNTER — Other Ambulatory Visit: Payer: Self-pay

## 2022-05-02 MED ORDER — LOSARTAN POTASSIUM 25 MG PO TABS: 25.0000 mg | ORAL_TABLET | Freq: Every day | ORAL | 1 refills | Status: DC

## 2022-05-02 MED ORDER — FINASTERIDE 5 MG PO TABS: 5.0000 mg | ORAL_TABLET | Freq: Every day | ORAL | 1 refills | Status: DC

## 2022-05-02 MED ORDER — FERROUS SULFATE 325 (65 FE) MG PO TBEC: 325.0000 mg | DELAYED_RELEASE_TABLET | Freq: Two times a day (BID) | ORAL | 1 refills | Status: DC

## 2022-05-02 MED ORDER — CARVEDILOL 12.5 MG PO TABS: 12.5000 mg | ORAL_TABLET | Freq: Two times a day (BID) | ORAL | 1 refills | Status: DC

## 2022-05-02 MED ORDER — FUROSEMIDE 40 MG PO TABS: 40.0000 mg | ORAL_TABLET | Freq: Every day | ORAL | 1 refills | Status: DC

## 2022-05-02 MED ORDER — ROSUVASTATIN CALCIUM 5 MG PO TABS: 5.0000 mg | ORAL_TABLET | Freq: Every day | ORAL | 3 refills | Status: DC

## 2022-05-03 ENCOUNTER — Other Ambulatory Visit: Payer: Self-pay

## 2022-05-03 ENCOUNTER — Telehealth: Payer: Self-pay

## 2022-05-03 MED ORDER — LOSARTAN POTASSIUM 25 MG PO TABS: 25.0000 mg | ORAL_TABLET | Freq: Every day | ORAL | 1 refills | Status: DC

## 2022-05-03 MED ORDER — FERROUS SULFATE 325 (65 FE) MG PO TBEC: 325.0000 mg | DELAYED_RELEASE_TABLET | Freq: Two times a day (BID) | ORAL | 1 refills | Status: AC

## 2022-05-03 MED ORDER — TRIAMCINOLONE ACETONIDE 0.1 % EX OINT: 1.0000 | TOPICAL_OINTMENT | Freq: Two times a day (BID) | CUTANEOUS | 0 refills | Status: DC | PRN

## 2022-05-03 MED ORDER — FINASTERIDE 5 MG PO TABS: 5.0000 mg | ORAL_TABLET | Freq: Every day | ORAL | 1 refills | Status: DC

## 2022-05-03 NOTE — Telephone Encounter (Signed)
error 

## 2022-05-18 ENCOUNTER — Other Ambulatory Visit: Payer: Self-pay

## 2022-05-18 MED ORDER — HYDROXYZINE HCL 10 MG PO TABS: 10.0000 mg | ORAL_TABLET | Freq: Three times a day (TID) | ORAL | 1 refills | Status: DC | PRN

## 2022-05-18 MED ORDER — CARVEDILOL 12.5 MG PO TABS: 12.5000 mg | ORAL_TABLET | Freq: Two times a day (BID) | ORAL | 1 refills | Status: DC

## 2022-05-18 MED ORDER — ROSUVASTATIN CALCIUM 5 MG PO TABS: 5.0000 mg | ORAL_TABLET | Freq: Every day | ORAL | 3 refills | Status: DC

## 2022-05-19 DIAGNOSIS — J22 Unspecified acute lower respiratory infection: Secondary | ICD-10-CM | POA: Diagnosis not present

## 2022-05-23 ENCOUNTER — Encounter: Payer: 59 | Admitting: Student

## 2022-05-23 DIAGNOSIS — R310 Gross hematuria: Secondary | ICD-10-CM | POA: Diagnosis not present

## 2022-05-25 ENCOUNTER — Other Ambulatory Visit: Payer: Self-pay

## 2022-05-25 MED ORDER — ROSUVASTATIN CALCIUM 5 MG PO TABS: 5.0000 mg | ORAL_TABLET | Freq: Every day | ORAL | 3 refills | Status: DC

## 2022-05-25 MED ORDER — HYDROXYZINE HCL 10 MG PO TABS: 10.0000 mg | ORAL_TABLET | Freq: Three times a day (TID) | ORAL | 1 refills | Status: DC | PRN

## 2022-05-25 NOTE — Telephone Encounter (Signed)
Patients daughter called she stated [facility] from this point on patient will be using [facility], patient is requesting a rx refill for 2 medications.

## 2022-06-01 DIAGNOSIS — I5030 Unspecified diastolic (congestive) heart failure: Secondary | ICD-10-CM | POA: Diagnosis not present

## 2022-06-01 DIAGNOSIS — R7303 Prediabetes: Secondary | ICD-10-CM | POA: Diagnosis not present

## 2022-06-01 DIAGNOSIS — D631 Anemia in chronic kidney disease: Secondary | ICD-10-CM | POA: Diagnosis not present

## 2022-06-01 DIAGNOSIS — N1832 Chronic kidney disease, stage 3b: Secondary | ICD-10-CM | POA: Diagnosis not present

## 2022-06-01 DIAGNOSIS — N189 Chronic kidney disease, unspecified: Secondary | ICD-10-CM | POA: Diagnosis not present

## 2022-06-02 ENCOUNTER — Ambulatory Visit (INDEPENDENT_AMBULATORY_CARE_PROVIDER_SITE_OTHER): Payer: 59

## 2022-06-02 ENCOUNTER — Encounter: Payer: Self-pay | Admitting: Student

## 2022-06-02 ENCOUNTER — Ambulatory Visit (INDEPENDENT_AMBULATORY_CARE_PROVIDER_SITE_OTHER): Payer: 59 | Admitting: Student

## 2022-06-02 VITALS — BP 169/74 | HR 65 | Temp 98.3°F | Ht 69.0 in | Wt 197.5 lb

## 2022-06-02 DIAGNOSIS — N1832 Chronic kidney disease, stage 3b: Secondary | ICD-10-CM

## 2022-06-02 DIAGNOSIS — F17210 Nicotine dependence, cigarettes, uncomplicated: Secondary | ICD-10-CM | POA: Diagnosis not present

## 2022-06-02 DIAGNOSIS — I5032 Chronic diastolic (congestive) heart failure: Secondary | ICD-10-CM

## 2022-06-02 DIAGNOSIS — Z0000 Encounter for general adult medical examination without abnormal findings: Secondary | ICD-10-CM | POA: Diagnosis not present

## 2022-06-02 DIAGNOSIS — Z862 Personal history of diseases of the blood and blood-forming organs and certain disorders involving the immune mechanism: Secondary | ICD-10-CM | POA: Diagnosis not present

## 2022-06-02 DIAGNOSIS — I130 Hypertensive heart and chronic kidney disease with heart failure and stage 1 through stage 4 chronic kidney disease, or unspecified chronic kidney disease: Secondary | ICD-10-CM

## 2022-06-02 DIAGNOSIS — I10 Essential (primary) hypertension: Secondary | ICD-10-CM

## 2022-06-02 DIAGNOSIS — R7303 Prediabetes: Secondary | ICD-10-CM | POA: Diagnosis not present

## 2022-06-02 DIAGNOSIS — I5031 Acute diastolic (congestive) heart failure: Secondary | ICD-10-CM | POA: Diagnosis not present

## 2022-06-02 MED ORDER — EMPAGLIFLOZIN 10 MG PO TABS: 10.0000 mg | ORAL_TABLET | Freq: Every day | ORAL | 2 refills | Status: DC

## 2022-06-02 MED ORDER — FUROSEMIDE 40 MG PO TABS: 40.0000 mg | ORAL_TABLET | Freq: Two times a day (BID) | ORAL | 1 refills | Status: DC

## 2022-06-02 MED ORDER — LOSARTAN POTASSIUM 50 MG PO TABS: 50.0000 mg | ORAL_TABLET | Freq: Every day | ORAL | 11 refills | Status: DC

## 2022-06-02 NOTE — Assessment & Plan Note (Signed)
Blood pressure remains uncontrolled at 169/74. He reports adherence to Coreg 3.125 mg BID, Losartan 25 mg daily and Lasix 40 mg daily. He did not take his medication this morning.  His wife and his daughter helped remind him taking medications every day.  He was seen by nephrologist yesterday who recommended increase his losartan to 50 mg and up his diuretics.  -Continue Coreg -Increase losartan to 50 mg daily -Increase Lasix to 40 mg twice daily -Repeat blood pressure and BMP in 2 weeks

## 2022-06-02 NOTE — Assessment & Plan Note (Addendum)
-  Added Jardiance for prediabetes and nephro protection -Patient has an eye doctor that he will follow-up

## 2022-06-02 NOTE — Progress Notes (Signed)
CC: F/u on HTN  HPI:  [name] is a 72 y.o. living with HTN, CKD3b, anemia presents to the clinic for blood pressure recheck.  Please see problem based charting for detail  Past Medical History:  Diagnosis Date   Blood in stool last few years   BPH (benign prostatic hyperplasia)    Hard of hearing    Headache(784.0)    Hypertension    Review of Systems:  per HPI  Physical Exam:  Vitals:   06/02/22 0918 06/02/22 1002  BP: (!) 171/88 (!) 169/74  Pulse: 63 65  Temp: 98.3 F (36.8 C)   TempSrc: Oral   SpO2: 100%   Weight: 197 lb 8 oz (89.6 kg)   Height: '5\' 9"'$  (1.753 m)    Physical Exam Constitutional:      General: He is not in acute distress.    Appearance: He is not ill-appearing.  HENT:     Head: Normocephalic.  Eyes:     General:        Right eye: No discharge.        Left eye: No discharge.     Conjunctiva/sclera: Conjunctivae normal.  Cardiovascular:     Rate and Rhythm: Normal rate and regular rhythm.     Comments: No JVD.  +1 lower extremity edema Pulmonary:     Effort: Pulmonary effort is normal. No respiratory distress.     Breath sounds: Normal breath sounds. No wheezing.  Abdominal:     General: Bowel sounds are normal. There is no distension.     Palpations: Abdomen is soft.     Tenderness: There is no abdominal tenderness.  Skin:    General: Skin is warm.  Neurological:     Mental Status: He is alert. Mental status is at baseline.  Psychiatric:        Mood and Affect: Mood normal.      Assessment & Plan:   See Encounters Tab for problem based charting.  Uncontrolled hypertension Blood pressure remains uncontrolled at 169/74. He reports adherence to Coreg 3.125 mg BID, Losartan 25 mg daily and Lasix 40 mg daily. He did not take his medication this morning.  His wife and his daughter helped remind him taking medications every day.  He was seen by nephrologist yesterday who recommended increase his losartan to 50 mg and up his  diuretics.  -Continue Coreg -Increase losartan to 50 mg daily -Increase Lasix to 40 mg twice daily -Repeat blood pressure and BMP in 2 weeks  Diastolic congestive heart failure ([place]) He was hospitalized in December for acute HFpEF exacerbation requiring IV Lasix.  He was discharged with oral Lasix 40 mg daily, which he report adherence to.  Said that he has good urine output.  Reports persistent lower extremity edema, mild shortness of breath with exertion but no orthopnea.  Daughter states that his exercise tolerance has gone down the last few years.  He cannot walk long distance or take the stairs.  Physical exam showed +1 edema lower extremities.  Lungs are clear bilaterally.  No JVD.  Hypertension is likely underlying cause of his HFpEF.  There is evidence of severe LVH on echo.  -Increase Lasix to 40 mg twice daily as above -Follow-up in 2 weeks for repeat BMP  Chronic kidney disease, stage 3b ([place]) Patient is following [facility].  -Continue Coreg, losartan and Lasix as above -Added Jardiance for nephro protection  Prediabetes -Added Jardiance for prediabetes and nephro protection -Patient has an eye  doctor that he will follow-up  History of anemia due to CKD Hemoglobin improved to 8.6 last visit.  He has a component of iron deficiency anemia which could be from his orchiectomy surgery.  He reportedly had a colonoscopy last year with Dr. Carol Ada.  He denies any melena or hematochezia.  He is taking p.o. iron supplementation.  -Will obtain records from Dr. Ulyses Amor office -Continue iron supplement.  Healthcare maintenance -Obtain hepatitis C screening with blood work next visit -Advised patient to go to his pharmacy for shingles and COVID booster   Patient discussed with Dr. Philipp Ovens

## 2022-06-02 NOTE — Assessment & Plan Note (Addendum)
He was hospitalized in December for acute HFpEF exacerbation requiring IV Lasix.  He was discharged with oral Lasix 40 mg daily, which he report adherence to.  Said that he has good urine output.  Reports persistent lower extremity edema, mild shortness of breath with exertion but no orthopnea.  Daughter states that his exercise tolerance has gone down the last few years.  He cannot walk long distance or take the stairs.  Physical exam showed +1 edema lower extremities.  Lungs are clear bilaterally.  No JVD.  Hypertension is likely underlying cause of his HFpEF.  There is evidence of severe LVH on echo.  -Increase Lasix to 40 mg twice daily as above -Follow-up in 2 weeks for repeat BMP

## 2022-06-02 NOTE — Assessment & Plan Note (Addendum)
Hemoglobin improved to 8.6 last visit.  He has a component of iron deficiency anemia which could be from his orchiectomy surgery.  He reportedly had a colonoscopy last year with Dr. Carol Ada.  He denies any melena or hematochezia.  He is taking p.o. iron supplementation.  -Will obtain records from Dr. Ulyses Amor office -Continue iron supplement.

## 2022-06-02 NOTE — Assessment & Plan Note (Signed)
-  Obtain hepatitis C screening with blood work next visit -Advised patient to go to his pharmacy for shingles and COVID booster

## 2022-06-02 NOTE — Patient Instructions (Addendum)
Mr. Wilcher,  It was nice seeing you in the clinic today.  Here is a summary what we talked about:  1.  To better control your blood pressure, please increase your losartan to 50 mg and Lasix to twice daily.  Please continue Coreg as instructed.  2.  I added Jardiance to help with your prediabetes kidney disease  3.  Please continue iron supplementation for your anemia.  I will get the records from Dr. Benson Norway to see what procedures have been done.  4.  Please obtain the shingles vaccine and COVID booster at the pharmacy  Please return in 2 weeks for blood pressure recheck and blood work  Dr. Alfonse Spruce

## 2022-06-02 NOTE — Progress Notes (Signed)
Subjective:   Seth Santana is a 72 y.o. male who presents for an Initial Medicare Annual Wellness Visit. I connected with  Seth Santana on 06/02/22 by a  Face-To-Face  enabled telemedicine application and verified that I am speaking with the correct person using two identifiers.  Patient Location: Other:  Office/Clinic  Provider Location: Office/Clinic  I discussed the limitations of evaluation and management by telemedicine. The patient expressed understanding and agreed to proceed.  Review of Systems    Defer to PCP       Objective:    Today's Vitals   06/02/22 1039 06/02/22 1040  BP: (!) 171/88 (!) 169/74  Pulse: 63 65  Temp: 98.3 F (36.8 C)   TempSrc: Oral   SpO2: 100%   Weight: 197 lb 8 oz (89.6 kg)   Height: '5\' 9"'$  (1.753 m)    Body mass index is 29.17 kg/m.     06/02/2022   10:40 AM 06/02/2022    9:16 AM 03/15/2022   10:35 AM 03/13/2022    2:11 PM 10/09/2013    1:17 PM 09/29/2013   10:16 AM  Advanced Directives  Does Patient Have a Medical Advance Directive? No No No No Patient does not have advance directive;Patient would not like information Patient does not have advance directive;Patient would not like information  Would patient like information on creating a medical advance directive? No - Patient declined No - Patient declined No - Patient declined No - Patient declined      Current Medications (verified) Outpatient Encounter Medications as of 06/02/2022  Medication Sig   acetaminophen (TYLENOL) 500 MG tablet Take 500 mg by mouth every 6 (six) hours as needed for moderate pain.   carvedilol (COREG) 12.5 MG tablet Take 1 tablet (12.5 mg total) by mouth 2 (two) times daily with a meal.   Cyanocobalamin (B-12 PO) Take 1 tablet by mouth daily as needed (energy).   ferrous sulfate 325 (65 FE) MG EC tablet Take 1 tablet (325 mg total) by mouth 2 (two) times daily.   FIBER PO Take 1 tablet by mouth daily as needed (constipation).   finasteride (PROSCAR) 5  MG tablet Take 1 tablet (5 mg total) by mouth daily.   furosemide (LASIX) 40 MG tablet Take 1 tablet (40 mg total) by mouth 2 (two) times daily.   gabapentin (NEURONTIN) 300 MG capsule Take 300 mg by mouth 2 (two) times daily.   hydrOXYzine (ATARAX) 10 MG tablet Take 1 tablet (10 mg total) by mouth every 8 (eight) hours as needed for itching.   losartan (COZAAR) 50 MG tablet Take 1 tablet (50 mg total) by mouth daily.   Multiple Vitamins-Minerals (MENS 50+ MULTIVITAMIN) TABS Take 1 tablet by mouth daily.   omeprazole (PRILOSEC) 40 MG capsule Take 1 capsule (40 mg total) by mouth daily.   rosuvastatin (CRESTOR) 5 MG tablet Take 1 tablet (5 mg total) by mouth daily.   senna-docusate (SENOKOT-S) 8.6-50 MG tablet Take 1 tablet by mouth 2 (two) times daily. While taking strong pain meds to prevent constipation   triamcinolone ointment (KENALOG) 0.1 % Apply 1 Application topically 2 (two) times daily as needed (itching).   No facility-administered encounter medications on file as of 06/02/2022.    Allergies (verified) Patient has no known allergies.   History: Past Medical History:  Diagnosis Date   Blood in stool last few years   BPH (benign prostatic hyperplasia)    Hard of hearing    Headache(784.0)  Hypertension    Past Surgical History:  Procedure Laterality Date   COLONOSCOPY WITH PROPOFOL N/A 10/09/2013   Procedure: COLONOSCOPY WITH PROPOFOL;  Surgeon: Garlan Fair, MD;  Location: [facility];  Service: Endoscopy;  Laterality: N/A;   NO PAST SURGERIES     ORCHIECTOMY Right 03/15/2022   Procedure: INGUINAL RADICAL ORCHIECTOMY;  Surgeon: [name], MD;  Location: [facility];  Service: Urology;  Laterality: Right;   Family History  Problem Relation Age of Onset   Kidney disease Mother    Diabetes Mother    Cancer Brother    Social History   Socioeconomic History   Marital status: Single    Spouse name: Not on file   Number of children: Not on file   Years of  education: Not on file   Highest education level: Not on file  Occupational History   Not on file  Tobacco Use   Smoking status: Every Day    Packs/day: 0.25    Years: 30.00    Total pack years: 7.50    Types: Cigarettes   Smokeless tobacco: Never  Vaping Use   Vaping Use: Never used  Substance and Sexual Activity   Alcohol use: Yes    Comment: some days   Drug use: Yes    Types: Marijuana    Comment: occ   Sexual activity: Not on file  Other Topics Concern   Not on file  Social History Narrative   Not on file   Social Determinants of Health   Financial Resource Strain: High Risk (06/02/2022)   Overall Financial Resource Strain (CARDIA)    Difficulty of Paying Living Expenses: Very hard  Food Insecurity: Food Insecurity Present (06/02/2022)   Hunger Vital Sign    Worried About Running Out of Food in the Last Year: Often true    Ran Out of Food in the Last Year: Often true  Transportation Needs: No Transportation Needs (03/15/2022)   PRAPARE - Hydrologist (Medical): No    Lack of Transportation (Non-Medical): No  Physical Activity: Insufficiently Active (06/02/2022)   Exercise Vital Sign    Days of Exercise per Week: 1 day    Minutes of Exercise per Session: 30 min  Stress: No Stress Concern Present (06/02/2022)   [place]    Feeling of Stress : Not at all  Social Connections: Socially Isolated (06/02/2022)   Social Connection and Isolation Panel [NHANES]    Frequency of Communication with Friends and Family: Three times a week    Frequency of Social Gatherings with Friends and Family: Once a week    Attends Religious Services: Never    Marine scientist or Organizations: No    Attends Music therapist: Never    Marital Status: Never married    Tobacco Counseling Ready to quit: Not Answered Counseling given: Not Answered   Clinical  Intake:  Pre-visit preparation completed: Yes  Pain : No/denies pain     Nutritional Risks: None Diabetes: No  How often do you need to have someone help you when you read instructions, pamphlets, or other written materials from your doctor or pharmacy?: 1 - Never  Diabetic?No  Interpreter Needed?: No  Information entered by :: Corey Skains Kalee Mcclenathan,cma 06/02/22 10:40am   Activities of Daily Living    06/02/2022   10:40 AM 06/02/2022    9:16 AM  In your present state of health, do you have any difficulty  performing the following activities:  Hearing? 0 0  Vision? 0 0  Difficulty concentrating or making decisions? 1 1  Comment  at times  Walking or climbing stairs? 1 1  Dressing or bathing? 0 0  Doing errands, shopping? 0 0    Patient Care Team: [name], DO as PCP - General (Internal Medicine)  Indicate any recent Medical Services you may have received from other than [facility] providers in the past year (date may be approximate).     Assessment:   This is a routine wellness examination for [employer].  Hearing/Vision screen No results found.  Dietary issues and exercise activities discussed:     Goals Addressed   None   Depression Screen    06/02/2022   10:40 AM 06/02/2022   10:37 AM  PHQ 2/9 Scores  PHQ - 2 Score 0 0    Fall Risk    06/02/2022   10:40 AM 06/02/2022    9:15 AM  Fall Risk   Falls in the past year? 0 0  Number falls in past yr: 0 0  Injury with Fall? 0 0  Risk for fall due to : No Fall Risks No Fall Risks  Follow up Falls evaluation completed;Falls prevention discussed Falls evaluation completed;Falls prevention discussed    FALL RISK PREVENTION PERTAINING TO THE HOME:  Any stairs in or around the home? Yes  If so, are there any without handrails? No  Home free of loose throw rugs in walkways, pet beds, electrical cords, etc? Yes  Adequate lighting in your home to reduce risk of falls? Yes   ASSISTIVE DEVICES UTILIZED TO PREVENT  FALLS:  Life alert? No  Use of a cane, walker or w/c? Yes  Grab bars in the bathroom? No  Shower chair or bench in shower? No  Elevated toilet seat or a handicapped toilet? No   TIMED UP AND GO:  Was the test performed? Yes .  Length of time to ambulate 10 feet: 1 min.  Gait slow and steady without use of assistive device  Cognitive Function:        06/02/2022   10:41 AM  6CIT Screen  What Year? 0 points  What month? 0 points  What time? 0 points  Count back from 20 0 points  Months in reverse 0 points  Repeat phrase 0 points  Total Score 0 points    Immunizations Immunization History  Administered Date(s) Administered   Moderna Sars-Covid-2 Vaccination 12/08/2019, 01/06/2020   PNEUMOCOCCAL CONJUGATE-20 03/16/2022   Tdap 02/08/2013    TDAP status: Up to date  Flu Vaccine status: Up to date  Pneumococcal vaccine status: Up to date  Covid-19 vaccine status: Completed vaccines  Qualifies for Shingles Vaccine? No   Zostavax completed No   Shingrix Completed?: No.    Education has been provided regarding the importance of this vaccine. Patient has been advised to call insurance company to determine out of pocket expense if they have not yet received this vaccine. Advised may also receive vaccine at local pharmacy or Health Dept. Verbalized acceptance and understanding.  Screening Tests Health Maintenance  Topic Date Due   OPHTHALMOLOGY EXAM  Never done   Hepatitis C Screening  Never done   Zoster Vaccines- Shingrix (1 of 2) Never done   COVID-19 Vaccine (3 - Moderna risk series) 02/03/2020   HEMOGLOBIN A1C  09/21/2022   DTaP/Tdap/Td (2 - Td or Tdap) 02/09/2023   FOOT EXAM  03/01/2023   Diabetic kidney evaluation -  eGFR measurement  03/23/2023   Diabetic kidney evaluation - Urine ACR  03/23/2023   Medicare Annual Wellness (AWV)  06/03/2023   COLONOSCOPY (Pts 45-22yr Insurance coverage will need to be confirmed)  10/10/2023   Pneumonia Vaccine 70 Years old   Completed   INFLUENZA VACCINE  Completed   HPV VACCINES  Aged Out    Health Maintenance  Health Maintenance Due  Topic Date Due   OPHTHALMOLOGY EXAM  Never done   Hepatitis C Screening  Never done   Zoster Vaccines- Shingrix (1 of 2) Never done   COVID-19 Vaccine (3 - Moderna risk series) 02/03/2020    Colorectal cancer screening: Type of screening: Colonoscopy. Completed 10/09/2013. Repeat every 10 years  Lung Cancer Screening: (Low Dose CT Chest recommended if Age 70-80years, 30 pack-year currently smoking OR have quit w/in 15years.) does not qualify.   Lung Cancer Screening Referral: N/A  Additional Screening:  Hepatitis C Screening: does not qualify; Completed N/A  Vision Screening: Recommended annual ophthalmology exams for early detection of glaucoma and other disorders of the eye. Is the patient up to date with their annual eye exam?  Yes  Who is the provider or what is the name of the office in which the patient attends annual eye exams? [facility] If pt is not established with a provider, would they like to be referred to a provider to establish care? No .   Dental Screening: Recommended annual dental exams for proper oral hygiene  Community Resource Referral / Chronic Care Management: CRR required this visit?  Yes   CCM required this visit?  No      Plan:     I have personally reviewed and noted the following in the patient's chart:   Medical and social history Use of alcohol, tobacco or illicit drugs  Current medications and supplements including opioid prescriptions. Patient is currently taking opioid prescriptions. Information provided to patient regarding non-opioid alternatives. Patient advised to discuss non-opioid treatment plan with their provider. Functional ability and status Nutritional status Physical activity Advanced directives List of other physicians Hospitalizations, surgeries, and ER visits in previous 12  months Vitals Screenings to include cognitive, depression, and falls Referrals and appointments  In addition, I have reviewed and discussed with patient certain preventive protocols, quality metrics, and best practice recommendations. A written personalized care plan for preventive services as well as general preventive health recommendations were provided to patient.     [facility]erin Perna [facility]  06/02/2022   Nurse Notes: Face-To-Face Visit  Mr. WBalsamo, Thank you for taking time to come for your Medicare Wellness Visit. I appreciate your ongoing commitment to your health goals. Please review the following plan we discussed and let me know if I can assist you in the future.   These are the goals we discussed:  Goals   None     This is a list of the screening recommended for you and due dates:  Health Maintenance  Topic Date Due   Eye exam for diabetics  Never done   Hepatitis C Screening: USPSTF Recommendation to screen - Ages 156-79yo.  Never done   Zoster (Shingles) Vaccine (1 of 2) Never done   COVID-19 Vaccine (3 - Moderna risk series) 02/03/2020   Hemoglobin A1C  09/21/2022   DTaP/Tdap/Td vaccine (2 - Td or Tdap) 02/09/2023   Complete foot exam   03/01/2023   Yearly kidney function blood test for diabetes  03/23/2023   Yearly kidney health  urinalysis for diabetes  03/23/2023   Medicare Annual Wellness Visit  06/03/2023   Colon Cancer Screening  10/10/2023   Pneumonia Vaccine  Completed   Flu Shot  Completed   HPV Vaccine  Aged Out

## 2022-06-02 NOTE — Assessment & Plan Note (Signed)
Patient is following [place] [facility].  -Continue Coreg, losartan and Lasix as above -Added Jardiance for nephro protection

## 2022-06-02 NOTE — Addendum Note (Signed)
Addended byGaylan Gerold on: 06/02/2022 12:01 PM   Modules accepted: Level of Service

## 2022-06-05 NOTE — Progress Notes (Signed)
Internal Medicine Clinic Attending  Case discussed with Dr. Nguyen  At the time of the visit.  We reviewed the resident's history and exam and pertinent patient test results.  I agree with the assessment, diagnosis, and plan of care documented in the resident's note. 

## 2022-06-07 NOTE — Progress Notes (Signed)
Internal Medicine Clinic Attending  Case and documentation of Dr. Alfonse Spruce  soon after the resident saw the patient reviewed.  I reviewed the AWV findings.  I agree with the assessment, diagnosis, and plan of care documented in the AWV note.

## 2022-06-16 ENCOUNTER — Ambulatory Visit (INDEPENDENT_AMBULATORY_CARE_PROVIDER_SITE_OTHER): Payer: 59 | Admitting: Student

## 2022-06-16 ENCOUNTER — Ambulatory Visit: Payer: 59

## 2022-06-16 ENCOUNTER — Encounter: Payer: Self-pay | Admitting: Student

## 2022-06-16 VITALS — BP 152/75 | HR 61 | Temp 98.0°F | Ht 69.0 in | Wt 195.7 lb

## 2022-06-16 DIAGNOSIS — I5032 Chronic diastolic (congestive) heart failure: Secondary | ICD-10-CM | POA: Diagnosis not present

## 2022-06-16 DIAGNOSIS — I5031 Acute diastolic (congestive) heart failure: Secondary | ICD-10-CM

## 2022-06-16 DIAGNOSIS — I110 Hypertensive heart disease with heart failure: Secondary | ICD-10-CM

## 2022-06-16 DIAGNOSIS — I10 Essential (primary) hypertension: Secondary | ICD-10-CM

## 2022-06-16 MED ORDER — FUROSEMIDE 40 MG PO TABS: 40.0000 mg | ORAL_TABLET | Freq: Every day | ORAL | 1 refills | Status: DC

## 2022-06-16 NOTE — Patient Instructions (Addendum)
[name], it was a pleasure seeing you today!  Today we discussed: - Decrease lasix to '40mg'$  once daily. I will also get some lab work today.  - Please keep a log of your blood pressures and return in two weeks to re-check your blood pressure. Please make sure to take your blood pressure medications that day as well.   I have ordered the following labs today:  Lab Orders         BMP8+Anion Gap     I have ordered the following medication/changed the following medications:   Start the following medications: Meds ordered this encounter  Medications   furosemide (LASIX) 40 MG tablet    Sig: Take 1 tablet (40 mg total) by mouth daily.    Dispense:  90 tablet    Refill:  1     Follow-up:  2 weeks    Please make sure to arrive 15 minutes prior to your next appointment. If you arrive late, you may be asked to reschedule.   We look forward to seeing you next time. Please call our clinic at [phone] if you have any questions or concerns. The best time to call is Monday-Friday from 9am-4pm, but there is someone available 24/7. If after hours or the weekend, call the main hospital number and ask for the Internal Medicine Resident On-Call. If you need medication refills, please notify your pharmacy one week in advance and they will send [place] a request.  Thank you for letting us take part in your care. Wishing you the best!  Thank you, Sanjuan Dame, MD

## 2022-06-17 LAB — BMP8+ANION GAP
Anion Gap: 14 mmol/L (ref 10.0–18.0)
BUN/Creatinine Ratio: 16 (ref 10–24)
BUN: 30 mg/dL — ABNORMAL HIGH (ref 8–27)
CO2: 22 mmol/L (ref 20–29)
Calcium: 9.8 mg/dL (ref 8.6–10.2)
Chloride: 105 mmol/L (ref 96–106)
Creatinine, Ser: 1.88 mg/dL — ABNORMAL HIGH (ref 0.76–1.27)
Glucose: 100 mg/dL — ABNORMAL HIGH (ref 70–99)
Potassium: 4.7 mmol/L (ref 3.5–5.2)
Sodium: 141 mmol/L (ref 134–144)
eGFR: 38 mL/min/{1.73_m2} — ABNORMAL LOW (ref >59)

## 2022-06-17 NOTE — Assessment & Plan Note (Signed)
Patient reports he took his blood pressure medications right before his visit today. He does not regularly check this at home. He denies any adverse side effects from his medications. Given he just took his medications right before the visit today, I do not feel we have an adequate read of his blood pressure. I have encouraged him to take this at home if possible. I am going to have him return in two weeks to adjust his blood pressure medications. I suspect he might need further agents, would consider addition of amlodipine at next visit.  - Continue carvedilol 12.5.mg twice daily - Continue losartan '50mg'$  daily - BMP today - Consider addition of amlodipine at next visit if still uncontrolled - Would also consider work-up for secondary causes of hypertension at next visit

## 2022-06-17 NOTE — Progress Notes (Signed)
   CC: follow-up for acute heart failure   HPI:  [name] is a 72 y.o. person with medical history as below presenting to [facility] for follow-up for acute heart failure.   Please see problem-based list for further details, assessments, and plans.  Past Medical History:  Diagnosis Date   Blood in stool last few years   BPH (benign prostatic hyperplasia)    Hard of hearing    Headache(784.0)    Hypertension    Review of Systems:  As per HPI  Physical Exam:  Vitals:   06/16/22 0857 06/16/22 0932  BP: (!) 159/84 (!) 152/75  Pulse: 70 61  Temp: 98 F (36.7 C)   TempSrc: Oral   SpO2: 100%   Weight: 195 lb 11.2 oz (88.8 kg)   Height: 5\' 9"  (1.753 m)    General: Resting comfortably in no acute distress CV: Regular rate, rhythm. No murmurs appreciated. Warm extremities. No JVD appreciated.  Pulm: Normal work of breathing on room air. No rales or wheezing appreciated bilaterally.  MSK: Normal bulk, tone. No pitting edema bilateral lower extremities.  Skin: Warm, dry. No rashes or lesions noted. Neuro: Awake, alert, conversing appropriately.   Assessment & Plan:   Diastolic congestive heart failure ([place]) During last visit two weeks ago patient was mildly symptomatic with dyspnea and was found to be in mild acute heart failure exacerbation with lower extremity edema on exam. At that time he was instructed to increase lasix dose to twice daily. He reports compliance with this today. He feels well overall, saying he does sometimes get dyspneic but this has improved since his last visit. He has been walking more recently as well. He denies chest pain, palpitations, orthopnea, lower extremity edema.   On exam, patient appears euvolemic without JVD, clear lungs on auscultation, and no lower extremity edema. It will be important moving forward for his blood pressure to be better controlled given that is likely the underlying etiology of his diastolic heart failure. He only recently  took his blood pressure medications this morning, as he was in the hospital visiting his daughter overnight.   - Decrease furosemide back to regular dose of 40mg  daily - Repeat BMP today - Carvedilol 12.mg twice daily - Jardiance 10mg  daily - Consider addition of additional agent if blood pressure not controlled at next visit - Follow-up in two weeks  Uncontrolled hypertension Patient reports he took his blood pressure medications right before his visit today. He does not regularly check this at home. He denies any adverse side effects from his medications. Given he just took his medications right before the visit today, I do not feel we have an adequate read of his blood pressure. I have encouraged him to take this at home if possible. I am going to have him return in two weeks to adjust his blood pressure medications. I suspect he might need further agents, would consider addition of amlodipine at next visit.  - Continue carvedilol 12.5.mg twice daily - Continue losartan 50mg  daily - BMP today - Consider addition of amlodipine at next visit if still uncontrolled - Would also consider work-up for secondary causes of hypertension at next visit   Patient discussed with Dr. Andrey Spearman, MD Internal Medicine PGY-3 Pager: [phone]

## 2022-06-17 NOTE — Assessment & Plan Note (Addendum)
During last visit two weeks ago patient was mildly symptomatic with dyspnea and was found to be in mild acute heart failure exacerbation with lower extremity edema on exam. At that time he was instructed to increase lasix dose to twice daily. He reports compliance with this today. He feels well overall, saying he does sometimes get dyspneic but this has improved since his last visit. He has been walking more recently as well. He denies chest pain, palpitations, orthopnea, lower extremity edema.   On exam, patient appears euvolemic without JVD, clear lungs on auscultation, and no lower extremity edema. It will be important moving forward for his blood pressure to be better controlled given that is likely the underlying etiology of his diastolic heart failure. He only recently took his blood pressure medications this morning, as he was in the hospital visiting his daughter overnight.   - Decrease furosemide back to regular dose of '40mg'$  daily - Repeat BMP today - Carvedilol 12.mg twice daily - Jardiance '10mg'$  daily - Consider addition of additional agent if blood pressure not controlled at next visit - Follow-up in two weeks

## 2022-06-22 DIAGNOSIS — R7303 Prediabetes: Secondary | ICD-10-CM | POA: Diagnosis not present

## 2022-06-22 DIAGNOSIS — I129 Hypertensive chronic kidney disease with stage 1 through stage 4 chronic kidney disease, or unspecified chronic kidney disease: Secondary | ICD-10-CM | POA: Diagnosis not present

## 2022-06-22 DIAGNOSIS — N1832 Chronic kidney disease, stage 3b: Secondary | ICD-10-CM | POA: Diagnosis not present

## 2022-06-22 DIAGNOSIS — I5030 Unspecified diastolic (congestive) heart failure: Secondary | ICD-10-CM | POA: Diagnosis not present

## 2022-06-22 DIAGNOSIS — N390 Urinary tract infection, site not specified: Secondary | ICD-10-CM | POA: Diagnosis not present

## 2022-06-22 NOTE — Progress Notes (Signed)
Internal Medicine Clinic Attending ? ?Case discussed with Dr. Braswell  at the time of the visit.  We reviewed the resident?s history and exam and pertinent patient test results.  I agree with the assessment, diagnosis, and plan of care documented in the resident?s note.  ?

## 2022-06-26 ENCOUNTER — Encounter: Payer: Self-pay | Admitting: Podiatry

## 2022-06-26 ENCOUNTER — Ambulatory Visit (INDEPENDENT_AMBULATORY_CARE_PROVIDER_SITE_OTHER): Payer: 59 | Admitting: Podiatry

## 2022-06-26 ENCOUNTER — Telehealth: Payer: Self-pay | Admitting: *Deleted

## 2022-06-26 VITALS — BP 147/81

## 2022-06-26 DIAGNOSIS — E1142 Type 2 diabetes mellitus with diabetic polyneuropathy: Secondary | ICD-10-CM | POA: Diagnosis not present

## 2022-06-26 DIAGNOSIS — L84 Corns and callosities: Secondary | ICD-10-CM | POA: Diagnosis not present

## 2022-06-26 DIAGNOSIS — B353 Tinea pedis: Secondary | ICD-10-CM

## 2022-06-26 DIAGNOSIS — M79675 Pain in left toe(s): Secondary | ICD-10-CM | POA: Diagnosis not present

## 2022-06-26 DIAGNOSIS — M79674 Pain in right toe(s): Secondary | ICD-10-CM

## 2022-06-26 DIAGNOSIS — B351 Tinea unguium: Secondary | ICD-10-CM

## 2022-06-26 NOTE — Progress Notes (Unsigned)
  Subjective:  Patient ID: Seth Santana, male    DOB: 11-23-1950,  MRN: [id number]  Seth Santana presents to clinic today for {jgcomplaint:23593}  Chief Complaint  Patient presents with   Nail Problem    [facility] PCP-[facility] PCP VST-2 weeks ago   New problem(s): None. {jgcomplaint:23593}  PCP is [facility], Jewell, DO.  No Known Allergies  Review of Systems: Negative except as noted in the HPI.  Objective: No changes noted in today's physical examination. Vitals:   06/26/22 1108  BP: (!) 147/81   Seth Santana is a pleasant 72 y.o. male {jgbodyhabitus:24098} AAO x 3.  Vascular Examination: CFT <3 seconds b/l. DP/PT pulses faintly palpable b/l. Skin temperature gradient warm to warm b/l. No pain with calf compression. No ischemia or gangrene. No cyanosis or clubbing noted b/l.    Neurological Examination: Pt has subjective symptoms of neuropathy. Proprioception intact bilaterally. Sensation grossly intact b/l with 10 gram monofilament. Vibratory sensation intact b/l.   Dermatological Examination: Pedal skin warm and supple b/l. Toenails 1-5 b/l thick, discolored, elongated with subungual debris and pain on dorsal palpation.    Hyperkeratotic lesion(s) medial IPJ of right great toe.  No erythema, no edema, no drainage, no fluctuance.  Musculoskeletal Examination: Muscle strength 5/5 to b/l LE. No pain, crepitus or joint limitation noted with ROM bilateral LE. No gross bony deformities bilaterally.  Radiographs: None  Assessment/Plan: 1. Pain due to onychomycosis of toenails of both feet   2. Tinea pedis of both feet   3. Callus   4. Diabetic peripheral neuropathy associated with type 2 diabetes mellitus ([place])     No orders of the defined types were placed in this encounter.   None {Jgplan:23602::"-Patient/POA to call should there be question/concern in the interim."}   No follow-ups on file.  Marzetta Board, DPM

## 2022-06-26 NOTE — Patient Instructions (Signed)
Spray Lotrimin Antifungal Spray Powder between toes once daily.  Dry well between toes after bath/shower.  To prevent reinfection, spray shoes with lysol every evening.  Clean tub or shower with bleach based cleanser.   Athlete's Foot Athlete's foot (tinea pedis) is a fungal infection of the skin on your feet. It often occurs on the skin that is between or underneath the toes. It can also occur on the soles of your feet. The infection can spread from person to person (is contagious). It can also spread when a person's bare feet come in contact with the fungus on shower floors or on items such as shoes. What are the causes? This condition is caused by a fungus that grows in warm, moist places. You can get athlete's foot by sharing shoes, shower stalls, towels, and wet floors with someone who is infected. Not washing your feet or changing your socks often enough can also lead to athlete's foot. What increases the risk? This condition is more likely to develop in: Men. People who have a weak body defense system (immune system). People who have diabetes. People who use public showers, such as at a gym. People who wear heavy-duty shoes, such as Environmental manager. Seasons with warm, humid weather. What are the signs or symptoms? Symptoms of this condition include: Itchy areas between your toes or on the soles of your feet. White, flaky, or scaly areas between your toes or on the soles of your feet. Very itchy small blisters between your toes or on the soles of your feet. Small cuts in your skin. These cuts can become infected. Thick or discolored toenails. How is this diagnosed? This condition may be diagnosed with a physical exam and a review of your medical history. Your health care provider may also take a skin or toenail sample to examine under a microscope. How is this treated? This condition is treated with antifungal medicines. These may be applied as powders, ointments, or  creams. In severe cases, an oral antifungal medicine may be given. Follow these instructions at home: Medicines Apply or take over-the-counter and prescription medicines only as told by your health care provider. Apply your antifungal medicine as told by your health care provider. Do not stop using the antifungal even if your condition improves. Foot care Do not scratch your feet. Keep your feet dry: Wear cotton or wool socks. Change your socks every day or if they become wet. Wear shoes that allow air to flow, such as sandals or canvas tennis shoes. Wash and dry your feet, including the area between your toes. Also, wash and dry your feet: Every day or as told by your health care provider. After exercising. General instructions Do not let others use towels, shoes, nail clippers, or other personal items that touch your feet. Protect your feet by wearing sandals in wet areas, such as locker rooms and shared showers. Keep all follow-up visits. This is important. If you have diabetes, keep your blood sugar under control. Contact a health care provider if: You have a fever. You have swelling, soreness, warmth, or redness in your foot. Your feet are not getting better with treatment. Your symptoms get worse. You have new symptoms. You have severe pain. Summary Athlete's foot (tinea pedis) is a fungal infection of the skin on your feet. It often occurs on skin that is between or underneath the toes. This condition is caused by a fungus that grows in warm, moist places. Symptoms include white, flaky, or scaly areas  between your toes or on the soles of your feet. This condition is treated with antifungal medicines. Keep your feet clean. Always dry them thoroughly. This information is not intended to replace advice given to you by your health care provider. Make sure you discuss any questions you have with your health care provider. Document Revised: 07/18/2020 Document Reviewed:  07/18/2020 Elsevier Patient Education  [name].

## 2022-06-26 NOTE — Telephone Encounter (Signed)
Call from patient's daughter wanted to  verify dosing of patient's FeSO4.  Was able to but FeSO4 OTC 325mg  (65) OTC.  And that patient should take 1 tablet 2 times a day.  Verified that she had gotten the correct dosing for the FeSO4 and the dosing.

## 2022-06-30 NOTE — Progress Notes (Unsigned)
CC: HFpEF and HTN  HPI:  Seth Santana is a 72 y.o. male living with a history stated below and presents today for a 2 week follow up of HFpEF and HTN. Please see problem based assessment and plan for additional details.  Past Medical History:  Diagnosis Date   Blood in stool last few years   BPH (benign prostatic hyperplasia)    Hard of hearing    Headache(784.0)    Hypertension     Current Outpatient Medications on File Prior to Visit  Medication Sig Dispense Refill   acetaminophen (TYLENOL) 500 MG tablet Take 500 mg by mouth every 6 (six) hours as needed for moderate pain.     carvedilol (COREG) 12.5 MG tablet Take 1 tablet (12.5 mg total) by mouth 2 (two) times daily with a meal. 180 tablet 1   Cyanocobalamin (B-12 PO) Take 1 tablet by mouth daily as needed (energy).     empagliflozin (JARDIANCE) 10 MG TABS tablet Take 1 tablet (10 mg total) by mouth daily before breakfast. 30 tablet 2   ferrous sulfate 325 (65 FE) MG EC tablet Take 1 tablet (325 mg total) by mouth 2 (two) times daily. 180 tablet 1   FIBER PO Take 1 tablet by mouth daily as needed (constipation).     finasteride (PROSCAR) 5 MG tablet Take 1 tablet (5 mg total) by mouth daily. 90 tablet 1   furosemide (LASIX) 40 MG tablet Take 1 tablet (40 mg total) by mouth daily. 90 tablet 1   gabapentin (NEURONTIN) 300 MG capsule Take 300 mg by mouth 2 (two) times daily.     hydrOXYzine (ATARAX) 10 MG tablet Take 1 tablet (10 mg total) by mouth every 8 (eight) hours as needed for itching. 30 tablet 1   losartan (COZAAR) 50 MG tablet Take 1 tablet (50 mg total) by mouth daily. 30 tablet 11   Multiple Vitamins-Minerals (MENS 50+ MULTIVITAMIN) TABS Take 1 tablet by mouth daily.     omeprazole (PRILOSEC) 40 MG capsule Take 1 capsule (40 mg total) by mouth daily. 90 capsule 1   rosuvastatin (CRESTOR) 5 MG tablet Take 1 tablet (5 mg total) by mouth daily. 90 tablet 3   senna-docusate (SENOKOT-S) 8.6-50 MG tablet Take 1 tablet  by mouth 2 (two) times daily. While taking strong pain meds to prevent constipation 10 tablet 0   triamcinolone ointment (KENALOG) 0.1 % Apply 1 Application topically 2 (two) times daily as needed (itching). 30 g 0   No current facility-administered medications on file prior to visit.    Family History  Problem Relation Age of Onset   Kidney disease Mother    Diabetes Mother    Cancer Brother     Social History   Socioeconomic History   Marital status: Single    Spouse name: Not on file   Number of children: Not on file   Years of education: Not on file   Highest education level: Not on file  Occupational History   Not on file  Tobacco Use   Smoking status: Every Day    Packs/day: 0.25    Years: 30.00    Additional pack years: 0.00    Total pack years: 7.50    Types: Cigarettes   Smokeless tobacco: Never  Vaping Use   Vaping Use: Never used  Substance and Sexual Activity   Alcohol use: Yes    Comment: some days   Drug use: Yes    Types: Marijuana  Comment: occ   Sexual activity: Not on file  Other Topics Concern   Not on file  Social History Narrative   Not on file   Social Determinants of Health   Financial Resource Strain: High Risk (06/02/2022)   Overall Financial Resource Strain (CARDIA)    Difficulty of Paying Living Expenses: Very hard  Food Insecurity: Food Insecurity Present (06/02/2022)   Hunger Vital Sign    Worried About Running Out of Food in the Last Year: Often true    Ran Out of Food in the Last Year: Often true  Transportation Needs: No Transportation Needs (03/15/2022)   PRAPARE - Hydrologist (Medical): No    Lack of Transportation (Non-Medical): No  Physical Activity: Insufficiently Active (06/02/2022)   Exercise Vital Sign    Days of Exercise per Week: 1 day    Minutes of Exercise per Session: 30 min  Stress: No Stress Concern Present (06/02/2022)   [place]    Feeling of Stress : Not at all  Social Connections: Socially Isolated (06/02/2022)   Social Connection and Isolation Panel [NHANES]    Frequency of Communication with Friends and Family: Three times a week    Frequency of Social Gatherings with Friends and Family: Once a week    Attends Religious Services: Never    Marine scientist or Organizations: No    Attends Archivist Meetings: Never    Marital Status: Never married  Intimate Partner Violence: Not At Risk (06/02/2022)   Humiliation, Afraid, Rape, and Kick questionnaire    Fear of Current or Ex-Partner: No    Emotionally Abused: No    Physically Abused: No    Sexually Abused: No    Review of Systems: ROS negative except for what is noted on the assessment and plan.  There were no vitals filed for this visit.  Physical Exam: Constitutional: well-appearing *** sitting in ***, in no acute distress HENT: normocephalic atraumatic, mucous membranes moist Eyes: conjunctiva non-erythematous Cardiovascular: regular rate and rhythm, no m/r/g Pulmonary/Chest: normal work of breathing on room air, lungs clear to auscultation bilaterally Abdominal: soft, non-tender, non-distended MSK: normal bulk and tone Neurological: alert & oriented x 3, no focal deficit Skin: warm and dry Psych: normal mood and behavior  Assessment & Plan:   HFpEF: prev was mildly symptomatic w dyspnea and LE [place]; improved at last visit 3/8 and now - advised to decrease lasix to 40 mg daily - continue coreg 12.5 mg bid, jardiance 10 mg  HTN: 170/84 - coreg 12.5 mg bid - Losartan 50 mg - start amlodipine 5, bmp today and then maybe inc losartan   Message nephro  Patient {GC/GE:[id number]::"discussed with","seen with"} Dr. OF:[id number]. Hoffman","Mullen","Narendra","Vincent","Guilloud","Lau","Machen"}  No problem-specific Assessment & Plan notes found for this encounter.   Buddy Duty, D.O. [facility], PGY-2 Phone: [phone] Date 06/30/2022 Time 11:11 AM

## 2022-07-03 ENCOUNTER — Other Ambulatory Visit: Payer: Self-pay | Admitting: Internal Medicine

## 2022-07-03 ENCOUNTER — Ambulatory Visit (INDEPENDENT_AMBULATORY_CARE_PROVIDER_SITE_OTHER): Payer: 59 | Admitting: Internal Medicine

## 2022-07-03 ENCOUNTER — Other Ambulatory Visit: Payer: Self-pay | Admitting: Student

## 2022-07-03 VITALS — BP 157/81 | HR 59 | Temp 98.1°F | Ht 69.0 in | Wt 201.0 lb

## 2022-07-03 DIAGNOSIS — R0789 Other chest pain: Secondary | ICD-10-CM

## 2022-07-03 DIAGNOSIS — R079 Chest pain, unspecified: Secondary | ICD-10-CM | POA: Diagnosis not present

## 2022-07-03 DIAGNOSIS — I10 Essential (primary) hypertension: Secondary | ICD-10-CM

## 2022-07-03 DIAGNOSIS — I110 Hypertensive heart disease with heart failure: Secondary | ICD-10-CM | POA: Diagnosis not present

## 2022-07-03 DIAGNOSIS — E785 Hyperlipidemia, unspecified: Secondary | ICD-10-CM

## 2022-07-03 DIAGNOSIS — N1832 Chronic kidney disease, stage 3b: Secondary | ICD-10-CM

## 2022-07-03 DIAGNOSIS — I5032 Chronic diastolic (congestive) heart failure: Secondary | ICD-10-CM

## 2022-07-03 MED ORDER — AMLODIPINE BESYLATE 5 MG PO TABS: 5.0000 mg | ORAL_TABLET | Freq: Every day | ORAL | 3 refills | Status: DC

## 2022-07-03 NOTE — Assessment & Plan Note (Signed)
Pt reports chronic, mild, sharp, intermittent left sided chest pain occurring 2-3 times a week. States pain is non-radiating and last 10-15 seconds before resolving spontaneously. Denies nausea, diaphoresis. Pain is worse with exertion. Unclear etiology. Presentation is inconsistent with angina as he describes the pain as sharp, non-pleuritic, and lasting only 15 seconds. However, pt does have several risk factors for angina including tobacco use, HFpEF, HTN, and hx of hyperlipidemia. Differential also includes GERD or respiratory etiologies. Will obtain lipid panel today and refer to pt to cardiology for further work up.   > lipid panel > cardiology referral

## 2022-07-03 NOTE — Assessment & Plan Note (Signed)
BP 157/81 today. Pt does not check his blood pressure at home. States he is compliant on his losartan 50 mg and Carvedilol 12.5 mg BID. Denies headache, abdominal pain, or dizziness. Did complain of some blurry vision but this only happens after he's been reading is iPad and sounds more consistent with eye strain. Given BP still not well controlled, will add CCB today. Will obtain BMP and consider increasing losartan at his next visit if his BP is still not well controlled.   > losartan 50 mg  > amlodipine 5 mg > BMP

## 2022-07-03 NOTE — Progress Notes (Signed)
Subjective:   Patient ID: Seth Santana male   DOB: 1950-07-10 72 y.o.   MRN: [id number]  HPI: [name] is a 72 y.o. male living with a history stated below and presents today for a 2 week follow up of HFpEF and HTN. Please see problem based assessment and plan for additional details.   Patient Active Problem List   Diagnosis Date Noted   Atypical chest pain 07/03/2022   Healthcare maintenance 06/02/2022   Prediabetes 03/22/2022   History of anemia due to CKD 123XX123   Diastolic congestive heart failure ([place]) 03/15/2022   Chronic kidney disease, stage 3b ([place]) 03/15/2022   Testicular mass 03/15/2022   BPH (benign prostatic hyperplasia) 03/15/2022   Uncontrolled hypertension 03/15/2022   Sensorineural hearing loss (SNHL) of both ears 11/22/2021   Closed fracture of tripod ([place]) 02/18/2013     Current Outpatient Medications  Medication Sig Dispense Refill   amLODipine (NORVASC) 5 MG tablet Take 1 tablet (5 mg total) by mouth daily. 90 tablet 3   acetaminophen (TYLENOL) 500 MG tablet Take 500 mg by mouth every 6 (six) hours as needed for moderate pain.     carvedilol (COREG) 12.5 MG tablet Take 1 tablet (12.5 mg total) by mouth 2 (two) times daily with a meal. 180 tablet 1   Cyanocobalamin (B-12 PO) Take 1 tablet by mouth daily as needed (energy).     empagliflozin (JARDIANCE) 10 MG TABS tablet Take 1 tablet (10 mg total) by mouth daily before breakfast. 30 tablet 2   ferrous sulfate 325 (65 FE) MG EC tablet Take 1 tablet (325 mg total) by mouth 2 (two) times daily. 180 tablet 1   FIBER PO Take 1 tablet by mouth daily as needed (constipation).     finasteride (PROSCAR) 5 MG tablet Take 1 tablet (5 mg total) by mouth daily. 90 tablet 1   furosemide (LASIX) 40 MG tablet Take 1 tablet (40 mg total) by mouth daily. 90 tablet 1   gabapentin (NEURONTIN) 300 MG capsule Take 300 mg by mouth 2 (two) times daily.     hydrOXYzine (ATARAX) 10 MG tablet Take 1 tablet (10 mg  total) by mouth every 8 (eight) hours as needed for itching. 30 tablet 1   losartan (COZAAR) 50 MG tablet Take 1 tablet (50 mg total) by mouth daily. 30 tablet 11   Multiple Vitamins-Minerals (MENS 50+ MULTIVITAMIN) TABS Take 1 tablet by mouth daily.     omeprazole (PRILOSEC) 40 MG capsule Take 1 capsule (40 mg total) by mouth daily. 90 capsule 1   rosuvastatin (CRESTOR) 5 MG tablet Take 1 tablet (5 mg total) by mouth daily. 90 tablet 3   senna-docusate (SENOKOT-S) 8.6-50 MG tablet Take 1 tablet by mouth 2 (two) times daily. While taking strong pain meds to prevent constipation 10 tablet 0   triamcinolone ointment (KENALOG) 0.1 % Apply 1 Application topically 2 (two) times daily as needed (itching). 30 g 0   No current facility-administered medications for this visit.     Review of Systems: Pertinent ROS present in the A&P. Otherwise negative.   Objective:   Physical Exam: Vitals:   07/03/22 0851 07/03/22 0932  BP: (!) 170/84 (!) 157/81  Pulse: 71 (!) 59  Temp: 98.1 F (36.7 C)   TempSrc: Oral   SpO2: 99%   Weight: 201 lb (91.2 kg)   Height: 5\' 9"  (1.753 m)    General: Resting comfortably in no acute distress CV: Regular rate,  rhythm. Warm extremities. No JVD appreciated.  Pulm: Normal work of breathing on room air. No rales or wheezing appreciated bilaterally.  MSK: Normal bulk, tone. No pitting edema bilateral lower extremities.  Skin: Warm, dry. No rashes or lesions noted. Neuro: Awake, alert, conversing appropriately.   Assessment & Plan:   Diastolic congestive heart failure (HCC) Pt reports improvement in his bilateral LE swelling/pain since visit 2 weeks ago. He is still having dyspnea that is worse with exertion but occasionally present at rest. States he is able to walk about 40 steps before becoming fatigued. Denies orthopnea or palpitations. He is compliant on his carvedilol 12 mg x2 daily and Jardiance 10 mg daily. States his nephrologist increased his furosemide  from 40 mg daily to 40 mg BID 1 week ago. He has been compliant with this. Weight is up 6 pounds from 2 weeks ago. Given overall improvement in his symptoms, will continue current medication regimen.   > furosemide 40 mg BID > carvedilol 12 mg BID > Jardiance 10 mg daily   Atypical chest pain Pt reports chronic, mild, sharp, intermittent left sided chest pain occurring 2-3 times a week. States pain is non-radiating and last 10-15 seconds before resolving spontaneously. Denies nausea, diaphoresis. Pain is worse with exertion. Unclear etiology. Presentation is inconsistent with angina as he describes the pain as sharp, non-pleuritic, and lasting only 15 seconds. However, pt does have several risk factors for angina including tobacco use, HFpEF, HTN, and hx of hyperlipidemia. Differential also includes GERD or respiratory etiologies. Will obtain lipid panel today and refer to pt to cardiology for further work up.   > lipid panel > cardiology referral   Uncontrolled hypertension BP 157/81 today. Pt does not check his blood pressure at home. States he is compliant on his losartan 50 mg and Carvedilol 12.5 mg BID. Denies headache, abdominal pain, or dizziness. Did complain of some blurry vision but this only happens after he's been reading is iPad and sounds more consistent with eye strain. Given BP still not well controlled, will add CCB today. Will obtain BMP and consider increasing losartan at his next visit if his BP is still not well controlled.   > losartan 50 mg  > amlodipine 5 mg > BMP

## 2022-07-03 NOTE — Patient Instructions (Addendum)
Today we discussed we discussed the following:  1) Hear failure Please continue taking your furosemide, carvedilol, and Jardiance as prescribed  2) Atypical chest pain Please follow up with cardiology  3) Hypertension Please continue taking your carvedilol and losartan as prescribed. We have added amlodipine 5 mg daily.

## 2022-07-03 NOTE — Assessment & Plan Note (Signed)
Pt reports improvement in his bilateral LE swelling/pain since visit 2 weeks ago. He is still having dyspnea that is worse with exertion but occasionally present at rest. States he is able to walk about 40 steps before becoming fatigued. Denies orthopnea or palpitations. He is compliant on his carvedilol 12 mg x2 daily and Jardiance 10 mg daily. States his nephrologist increased his furosemide from 40 mg daily to 40 mg BID 1 week ago. He has been compliant with this. Weight is up 6 pounds from 2 weeks ago. Given overall improvement in his symptoms, will continue current medication regimen.   > furosemide 40 mg BID > carvedilol 12 mg BID > Jardiance 10 mg daily

## 2022-07-03 NOTE — Progress Notes (Unsigned)
Cardiology Office Note:    Date:  07/04/2022   ID:  Seth Santana, DOB [birth year], MRN [id number]  PCP:  [name], DO   [place] Providers Cardiologist:  None     Referring MD: [name], DO   CC: chest pain Consulted for the evaluation of chest pain at the behest of  [place]  History of Present Illness:    Seth Santana is a 72 y.o. male with a hx of HTN and HFpEF, with CKD stage IIIb.  With atypical chest pain.  Patient notes that he is feeling Ok; he just hates seeing the doctor.   He went to his PCP recently and was noting chest pain about one a week to once every two weeks. It is sharp and last < 10 s.  No radiation no provocation.   He is SOB with mild exertion.  He has been smoking for 49 years.  He is cutting down.  He doesn't notice wheezing.  He had LE swelling and pins and needls pains with ambulatuion.  His nephrologist started SGLT2i; his GFR is stable around 35.   No weight gain,No syncope or near syncope . Notes  no palpitations or funny heart beats.     Past Medical History:  Diagnosis Date   Blood in stool last few years   BPH (benign prostatic hyperplasia)    Hard of hearing    Headache(784.0)    Hypertension     Past Surgical History:  Procedure Laterality Date   COLONOSCOPY WITH PROPOFOL N/A 10/09/2013   Procedure: COLONOSCOPY WITH PROPOFOL;  Surgeon: Garlan Fair, MD;  Location: [facility];  Service: Endoscopy;  Laterality: N/A;   NO PAST SURGERIES     ORCHIECTOMY Right 03/15/2022   Procedure: INGUINAL RADICAL ORCHIECTOMY;  Surgeon: [name], MD;  Location: [facility];  Service: Urology;  Laterality: Right;    Current Medications: Current Meds  Medication Sig   acetaminophen (TYLENOL) 500 MG tablet Take 500 mg by mouth every 6 (six) hours as needed for moderate pain.   amLODipine (NORVASC) 5 MG tablet Take 1 tablet (5 mg total) by mouth daily.   augmented betamethasone dipropionate (DIPROLENE-AF) 0.05 % cream as  needed (dry skin).   carvedilol (COREG) 12.5 MG tablet TAKE 1 TABLET BY MOUTH TWICE  DAILY WITH MEALS   Cyanocobalamin (B-12 PO) Take 1 tablet by mouth daily as needed (energy).   doxycycline (VIBRAMYCIN) 100 MG capsule Take 100 mg by mouth 2 (two) times daily.   empagliflozin (JARDIANCE) 10 MG TABS tablet TAKE 1 TABLET BY MOUTH DAILY  BEFORE BREAKFAST   ferrous sulfate 325 (65 FE) MG EC tablet Take 1 tablet (325 mg total) by mouth 2 (two) times daily.   FIBER PO Take 1 tablet by mouth daily as needed (constipation).   finasteride (PROSCAR) 5 MG tablet TAKE 1 TABLET BY MOUTH DAILY   furosemide (LASIX) 80 MG tablet Take 80mg  in the morning and 40 mg in the evening for 3 days then 80 mg daily   gabapentin (NEURONTIN) 300 MG capsule Take 300 mg by mouth 2 (two) times daily.   hydrochlorothiazide (HYDRODIURIL) 25 MG tablet Take 1 tablet (25 mg total) by mouth 2 (two) times daily.   hydrOXYzine (ATARAX) 10 MG tablet TAKE 1 TABLET BY MOUTH EVERY 8  HOURS AS NEEDED FOR ITCHING   losartan (COZAAR) 50 MG tablet Take 1 tablet (50 mg total) by mouth daily.   Multiple Vitamins-Minerals (MENS 50+ MULTIVITAMIN) TABS  Take 1 tablet by mouth daily.   omeprazole (PRILOSEC) 40 MG capsule TAKE 1 CAPSULE BY MOUTH DAILY   rosuvastatin (CRESTOR) 10 MG tablet Take 1 tablet (10 mg total) by mouth daily.   senna-docusate (SENOKOT-S) 8.6-50 MG tablet Take 1 tablet by mouth 2 (two) times daily. While taking strong pain meds to prevent constipation   triamcinolone ointment (KENALOG) 0.1 % APPLY TOPICALLY TO AFFECTED  AREA(S) TWICE DAILY AS NEEDED  FOR ITCHING   [DISCONTINUED] furosemide (LASIX) 40 MG tablet Take 1 tablet (40 mg total) by mouth daily.   [DISCONTINUED] rosuvastatin (CRESTOR) 5 MG tablet Take 1 tablet (5 mg total) by mouth daily.     Allergies:   Patient has no known allergies.   Social History   Socioeconomic History   Marital status: Single    Spouse name: Not on file   Number of children: Not on  file   Years of education: Not on file   Highest education level: Not on file  Occupational History   Not on file  Tobacco Use   Smoking status: Every Day    Packs/day: 0.25    Years: 30.00    Additional pack years: 0.00    Total pack years: 7.50    Types: Cigarettes   Smokeless tobacco: Never  Vaping Use   Vaping Use: Never used  Substance and Sexual Activity   Alcohol use: Yes    Comment: some days   Drug use: Yes    Types: Marijuana    Comment: occ   Sexual activity: Not on file  Other Topics Concern   Not on file  Social History Narrative   Not on file   Social Determinants of Health   Financial Resource Strain: High Risk (06/02/2022)   Overall Financial Resource Strain (CARDIA)    Difficulty of Paying Living Expenses: Very hard  Food Insecurity: Food Insecurity Present (06/02/2022)   Hunger Vital Sign    Worried About Running Out of Food in the Last Year: Often true    Ran Out of Food in the Last Year: Often true  Transportation Needs: No Transportation Needs (03/15/2022)   PRAPARE - Hydrologist (Medical): No    Lack of Transportation (Non-Medical): No  Physical Activity: Insufficiently Active (06/02/2022)   Exercise Vital Sign    Days of Exercise per Week: 1 day    Minutes of Exercise per Session: 30 min  Stress: No Stress Concern Present (06/02/2022)   [place]    Feeling of Stress : Not at all  Social Connections: Socially Isolated (06/02/2022)   Social Connection and Isolation Panel [NHANES]    Frequency of Communication with Friends and Family: Three times a week    Frequency of Social Gatherings with Friends and Family: Once a week    Attends Religious Services: Never    Marine scientist or Organizations: No    Attends Music therapist: Never    Marital Status: Never married     Family History: The patient's family history includes Cancer  in his brother; Diabetes in his mother; Kidney disease in his mother.  ROS:   Please see the history of present illness.     All other systems reviewed and are negative.  EKGs/Labs/Other Studies Reviewed:    The following studies were reviewed today:  EKG:  EKG is  ordered today.  The ekg ordered today demonstrates  07/04/22: SR with 1st HB,  RBBB and LAFB  Cardiac Studies & Procedures       ECHOCARDIOGRAM  ECHOCARDIOGRAM COMPLETE 03/16/2022  Narrative ECHOCARDIOGRAM REPORT    Patient Name:   KITT KERKHOFF Date of Exam: 03/16/2022 Medical Rec #:  [id number]        Height:       69.0 in Accession #:    [id number]       Weight:       193.1 lb Date of Birth:  [birth year]        BSA:          2.036 m Patient Age:    29 years         BP:           164/77 mmHg Patient Gender: M                HR:           84 bpm. Exam Location:  Inpatient  Procedure: 2D Echo, Cardiac Doppler and Color Doppler  Indications:    CHF  History:        Patient has no prior history of Echocardiogram examinations. CHF; Risk Factors:Hypertension.  Sonographer:    Wenda Low Referring Phys: [id number] TAYE T GONFA  IMPRESSIONS   1. Left ventricular ejection fraction, by estimation, is 55 to 60%. The left ventricle has normal function. The left ventricle has no regional wall motion abnormalities. There is severe concentric left ventricular hypertrophy. Left ventricular diastolic parameters are consistent with Grade II diastolic dysfunction (pseudonormalization). 2. Right ventricular systolic function is normal. The right ventricular size is normal. There is normal pulmonary artery systolic pressure. 3. Left atrial size was mildly dilated. 4. Right atrial size was mildly dilated. 5. The mitral valve is normal in structure. Mild mitral valve regurgitation. No evidence of mitral stenosis. 6. The aortic valve is normal in structure. Aortic valve regurgitation is not visualized. No aortic stenosis is  present. 7. There is borderline dilatation of the ascending aorta, measuring 37 mm. There is moderate dilatation of the aortic root, measuring 40 mm. 8. The inferior vena cava is normal in size with greater than 50% respiratory variability, suggesting right atrial pressure of 3 mmHg.  FINDINGS Left Ventricle: Left ventricular ejection fraction, by estimation, is 55 to 60%. The left ventricle has normal function. The left ventricle has no regional wall motion abnormalities. The left ventricular internal cavity size was normal in size. There is severe concentric left ventricular hypertrophy. Left ventricular diastolic parameters are consistent with Grade II diastolic dysfunction (pseudonormalization).  Right Ventricle: The right ventricular size is normal. No increase in right ventricular wall thickness. Right ventricular systolic function is normal. There is normal pulmonary artery systolic pressure. The tricuspid regurgitant velocity is 2.61 m/s, and with an assumed right atrial pressure of 3 mmHg, the estimated right ventricular systolic pressure is 123XX123 mmHg.  Left Atrium: Left atrial size was mildly dilated.  Right Atrium: Right atrial size was mildly dilated.  Pericardium: There is no evidence of pericardial effusion.  Mitral Valve: The mitral valve is normal in structure. Mild mitral valve regurgitation. No evidence of mitral valve stenosis. MV peak gradient, 4.7 mmHg. The mean mitral valve gradient is 2.0 mmHg.  Tricuspid Valve: The tricuspid valve is normal in structure. Tricuspid valve regurgitation is not demonstrated. No evidence of tricuspid stenosis.  Aortic Valve: The aortic valve is normal in structure. Aortic valve regurgitation is not visualized. No aortic stenosis is present. Aortic valve mean gradient measures 4.0  mmHg. Aortic valve peak gradient measures 8.8 mmHg. Aortic valve area, by VTI measures 2.73 cm.  Pulmonic Valve: The pulmonic valve was normal in structure.  Pulmonic valve regurgitation is trivial. No evidence of pulmonic stenosis.  Aorta: There is borderline dilatation of the ascending aorta, measuring 37 mm. There is moderate dilatation of the aortic root, measuring 40 mm.  Venous: The inferior vena cava is normal in size with greater than 50% respiratory variability, suggesting right atrial pressure of 3 mmHg.  IAS/Shunts: No atrial level shunt detected by color flow Doppler.   LEFT VENTRICLE PLAX 2D LVIDd:         5.70 cm   Diastology LVIDs:         4.20 cm   LV e' medial:    5.22 cm/s LV PW:         1.50 cm   LV E/e' medial:  19.0 LV IVS:        1.70 cm   LV e' lateral:   6.96 cm/s LVOT diam:     2.10 cm   LV E/e' lateral: 14.3 LV SV:         83 LV SV Index:   41 LVOT Area:     3.46 cm   RIGHT VENTRICLE RV Basal diam:  3.55 cm RV Mid diam:    2.80 cm RV S prime:     15.60 cm/s TAPSE (M-mode): 2.8 cm  LEFT ATRIUM             Index        RIGHT ATRIUM           Index LA diam:        4.50 cm 2.21 cm/m   RA Area:     23.00 cm LA Vol (A2C):   94.1 ml 46.23 ml/m  RA Volume:   69.70 ml  34.24 ml/m LA Vol (A4C):   50.9 ml 25.01 ml/m LA Biplane Vol: 69.9 ml 34.34 ml/m AORTIC VALVE                    PULMONIC VALVE AV Area (Vmax):    2.83 cm     PV Vmax:       1.15 m/s AV Area (Vmean):   3.04 cm     PV Peak grad:  5.3 mmHg AV Area (VTI):     2.73 cm AV Vmax:           148.00 cm/s AV Vmean:          89.000 cm/s AV VTI:            0.303 m AV Peak Grad:      8.8 mmHg AV Mean Grad:      4.0 mmHg LVOT Vmax:         121.00 cm/s LVOT Vmean:        78.100 cm/s LVOT VTI:          0.239 m LVOT/AV VTI ratio: 0.79  AORTA Ao Root diam: 4.00 cm Ao Asc diam:  3.70 cm  MITRAL VALVE               TRICUSPID VALVE MV Area (PHT): 3.72 cm    TR Peak grad:   27.2 mmHg MV Area VTI:   2.63 cm    TR Vmax:        261.00 cm/s MV Peak grad:  4.7 mmHg MV Mean grad:  2.0 mmHg    SHUNTS MV Vmax:  1.08 m/s    Systemic VTI:  0.24 m MV  Vmean:      65.1 cm/s   Systemic Diam: 2.10 cm MV Decel Time: 204 msec MV E velocity: 99.40 cm/s MV A velocity: 85.70 cm/s MV E/A ratio:  1.16  Kardie Tobb DO Electronically signed by Berniece Salines DO Signature Date/Time: 03/16/2022/5:08:25 PM    Final              Recent Labs: 02/16/2022: ALT 20; TSH 4.159 03/16/2022: B Natriuretic Peptide 217.3 03/17/2022: Magnesium 2.1 03/22/2022: Hemoglobin 8.6; Platelets 407 07/03/2022: BUN 34; Creatinine, Ser 2.01; Potassium 4.4; Sodium 140  Recent Lipid Panel    Component Value Date/Time   CHOL 200 (H) 07/03/2022 1015   TRIG 249 (H) 07/03/2022 1015   HDL 37 (L) 07/03/2022 1015   CHOLHDL 5.4 (H) 07/03/2022 1015   CHOLHDL 6.1 05/23/2013 1613   VLDL 78 (H) 05/23/2013 1613   LDLCALC 119 (H) 07/03/2022 1015        Physical Exam:    VS:  BP (!) 142/62   Pulse 68   Ht 5\' 9"  (1.753 m)   Wt 200 lb 9.6 oz (91 kg)   SpO2 94%   BMI 29.62 kg/m     Wt Readings from Last 3 Encounters:  07/04/22 200 lb 9.6 oz (91 kg)  07/03/22 201 lb (91.2 kg)  06/16/22 195 lb 11.2 oz (88.8 kg)    GEN: Smells of smoke, NAD HEENT: Normal NECK: JBD above the clavicle at 90 degrees CARDIAC: RRR, no murmurs, rubs, gallops RESPIRATORY:  Bilateral expiratory wheeze with normal effort and rate ABDOMEN: Soft, non-tender, non-distended SKIN: shiny appears of legs with +1 DP pulses, trace pitting edema left leg NEUROLOGIC:  Alert and oriented x 3 PSYCHIATRIC:  Normal affect   ASSESSMENT:    1. Precordial chest pain   2. PAD (peripheral artery disease) ([place])   3. Aortic atherosclerosis (H[facility])    PLAN:     Heart Failure Preserved Ejection Fraction (diastolic) - acute on chronic; complicated by CKD (sees Dr. Hollie Salk) - NYHA class III, Stage B, hypervolemic, etiology is multi-factorial - Diuretic regimen: Lasix 80 mg PO Daily and 40 mg PO PM for 3 days, then 80 mg PO daily  - Discussed the importance of fluid restriction of < 2 L, salt restriction, and  checking daily weights  -given HTN - continue ARB as per nephrology - GFR too low for MRA - SGLT2i continue - add hydralazine 25 mg PO BID for afterload reduction  - alcohol cessation discussed  Chest pain syndrome - cannot get regadenosin due to active wheeze - cannot get CCTA - exercise NM Stress test  Obstructive lung disease Active smoking - smoking cessation discussed  HLD Aortic atherosclerosis PAD - LDL is above goal increase statin and albs in 3 months - ABI and duplex - reviewed prior CT with team  Mild aortic dilation - echo in 03/2023  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  Concerns regarding medicines are outlined above.  Orders Placed This Encounter  Procedures   ALT   Lipid panel   Cardiac Stress Test: Informed Consent Details: Physician/Practitioner Attestation; Transcribe to consent form and obtain patient signature   MYOCARDIAL PERFUSION IMAGING   EKG 12-Lead   VAS [place]  ABI WITH/WO TBI   VAS [place] LOWER EXTREMITY ARTERIAL DUPLEX   Meds ordered this encounter  Medications   furosemide (LASIX) 80 MG tablet    Sig: Take 80mg  in the morning and 40 mg in the evening for 3 days then 80 mg daily    Dispense:  90 tablet    Refill:  3   hydrochlorothiazide (HYDRODIURIL) 25 MG tablet    Sig: Take 1 tablet (25 mg total) by mouth 2 (two) times daily.    Dispense:  180 tablet    Refill:  3   rosuvastatin (CRESTOR) 10 MG tablet    Sig: Take 1 tablet (10 mg total) by mouth daily.    Dispense:  90 tablet    Refill:  3    There are no Patient Instructions on file for this visit.   Signed, Werner Lean, MD  07/04/2022 9:33 AM    [place]

## 2022-07-04 ENCOUNTER — Encounter: Payer: Self-pay | Admitting: Internal Medicine

## 2022-07-04 ENCOUNTER — Ambulatory Visit: Payer: 59 | Attending: Internal Medicine | Admitting: Internal Medicine

## 2022-07-04 VITALS — BP 142/62 | HR 68 | Ht 69.0 in | Wt 200.6 lb

## 2022-07-04 DIAGNOSIS — I700 Atherosclerosis of aorta: Secondary | ICD-10-CM | POA: Diagnosis not present

## 2022-07-04 DIAGNOSIS — R072 Precordial pain: Secondary | ICD-10-CM

## 2022-07-04 DIAGNOSIS — I739 Peripheral vascular disease, unspecified: Secondary | ICD-10-CM | POA: Diagnosis not present

## 2022-07-04 LAB — BMP8+ANION GAP
Anion Gap: 17 mmol/L (ref 10.0–18.0)
BUN/Creatinine Ratio: 17 (ref 10–24)
BUN: 34 mg/dL — ABNORMAL HIGH (ref 8–27)
CO2: 21 mmol/L (ref 20–29)
Calcium: 10.1 mg/dL (ref 8.6–10.2)
Chloride: 102 mmol/L (ref 96–106)
Creatinine, Ser: 2.01 mg/dL — ABNORMAL HIGH (ref 0.76–1.27)
Glucose: 130 mg/dL — ABNORMAL HIGH (ref 70–99)
Potassium: 4.4 mmol/L (ref 3.5–5.2)
Sodium: 140 mmol/L (ref 134–144)
eGFR: 35 mL/min/{1.73_m2} — ABNORMAL LOW (ref >59)

## 2022-07-04 LAB — LIPID PANEL
Chol/HDL Ratio: 5.4 ratio — ABNORMAL HIGH (ref 0.0–5.0)
Cholesterol, Total: 200 mg/dL — ABNORMAL HIGH (ref 100–199)
HDL: 37 mg/dL — ABNORMAL LOW (ref >39)
LDL Chol Calc (NIH): 119 mg/dL — ABNORMAL HIGH (ref 0–99)
Triglycerides: 249 mg/dL — ABNORMAL HIGH (ref 0–149)
VLDL Cholesterol Cal: 44 mg/dL — ABNORMAL HIGH (ref 5–40)

## 2022-07-04 MED ORDER — HYDROCHLOROTHIAZIDE 25 MG PO TABS: 25.0000 mg | ORAL_TABLET | Freq: Two times a day (BID) | ORAL | 3 refills | Status: DC

## 2022-07-04 MED ORDER — ROSUVASTATIN CALCIUM 10 MG PO TABS: 10.0000 mg | ORAL_TABLET | Freq: Every day | ORAL | 3 refills | Status: DC

## 2022-07-04 MED ORDER — FUROSEMIDE 80 MG PO TABS: ORAL_TABLET | ORAL | 3 refills | Status: DC

## 2022-07-04 NOTE — Patient Instructions (Addendum)
Medication Instructions:  Your physician has recommended you make the following change in your medication:  START: furosemide (Lasix) 80 mg by mouth every morning and 40 mg every evening for 3 days THEN  TAKE 80 mg daily (Fluid pill)  START: hydralazine 25 mg by mouth twice daily (BP)  INCREASE: rosuvastatin (Crestor) to 10 mg by mouth once daily around dinner time (cholesterol)  *If you need a refill on your cardiac medications before your next appointment, please call your pharmacy*   Lab Work: IN 3 MONTHS: FLP (nothing to eat or drink 12 hours prior except water and plain black coffee)  If you have labs (blood work) drawn today and your tests are completely normal, you will receive your results only by: [place] (if you have MyChart) OR A paper copy in the mail If you have any lab test that is abnormal or we need to change your treatment, we will call you to review the results.   Testing/Procedures: Your physician has requested that you have en exercise stress myoview. For further information please visit [email or web address]. Please follow instruction sheet, as given.    You are scheduled for a Myocardial Perfusion Imaging Study. Please arrive 15 minutes prior to your appointment time for registration and insurance purposes.   The test will take approximately 3 to 4 hours to complete; you may bring reading material.  If someone comes with you to your appointment, they will need to remain in the main lobby due to limited space in the testing area.    How to prepare for your Myocardial Perfusion Test: Do not eat or drink 3 hours prior to your test, except you may have water. Do not consume products containing caffeine (regular or decaffeinated) 12 hours prior to your test. (ex: coffee, chocolate, sodas, tea). Do bring a list of your current medications with you.  If not listed below, you may take your medications as normal.  DO NOT TAKE CARVEDILOL (COREG) the day of  testing Do wear comfortable clothes (no dresses or overalls) and walking shoes, tennis shoes preferred (No heels or open toe shoes are allowed). Do NOT wear cologne, perfume, aftershave, or lotions (deodorant is allowed). If these instructions are not followed, your test will have to be rescheduled.  If you cannot keep your appointment, please provide 24 hours notification to the [facility], to avoid a possible $50 charge to your account.      Your physician has requested that you have an ankle brachial index (ABI). During this test an ultrasound and blood pressure cuff are used to evaluate the arteries that supply the legs with blood. Allow thirty minutes for this exam. There are no restrictions or special instructions.  Your physician has requested that you have a lower  extremity arterial duplex. This test is an ultrasound of the arteries in the legs or arms. It looks at arterial blood flow in the legs and arms. Allow one hour for Lower Arterial scans. There are no restrictions or special instructions   Follow-Up: At [facility], you and your health needs are our priority.  As part of our continuing mission to provide you with exceptional heart care, we have created designated Provider Care Teams.  These Care Teams include your primary Cardiologist (physician) and Advanced Practice Providers (APPs -  Physician Assistants and Nurse Practitioners) who all work together to provide you with the care you need, when you need it.  We recommend signing up for the patient portal called "MyChart".  Sign up information is provided on this After Visit Summary.  MyChart is used to connect with patients for Virtual Visits (Telemedicine).  Patients are able to view lab/test results, encounter notes, upcoming appointments, etc.  Non-urgent messages can be sent to your provider as well.   To learn more about what you can do with MyChart, go to [email or web address].    Your next appointment:   3-4  month(s)  Provider:   Werner Lean, MD  or Nicholes Rough, PA-C, Ambrose Pancoast, NP, or Ermalinda Barrios, PA-C

## 2022-07-04 NOTE — Progress Notes (Signed)
Renal function stable - still within category for CKD3b.  ASCVD risk 24.6% - patient just saw cardiology today and they increased his rosuvastatin from 5 mg to 10 mg. Would increase further to 20 mg dose (high intensity), but patient already has new rx being mailed to him.

## 2022-07-05 ENCOUNTER — Telehealth (HOSPITAL_COMMUNITY): Payer: Self-pay | Admitting: *Deleted

## 2022-07-05 NOTE — Telephone Encounter (Signed)
Patient given detailed instructions per Myocardial Perfusion Study Information Sheet for the test on  07/12/22 Patient notified to arrive 15 minutes early and that it is imperative to arrive on time for appointment to keep from having the test rescheduled.  If you need to cancel or reschedule your appointment, please call the office within 24 hours of your appointment. . Patient verbalized understanding. Seth Santana

## 2022-07-07 NOTE — Progress Notes (Signed)
Internal Medicine Clinic Attending  Case discussed with student doctor Milford Cage and with Dr. Raymondo Band  At the time of the visit.  We reviewed the resident's history and exam and pertinent patient test results.  I agree with the assessment, diagnosis, and plan of care documented in the resident's note.

## 2022-07-11 ENCOUNTER — Ambulatory Visit (INDEPENDENT_AMBULATORY_CARE_PROVIDER_SITE_OTHER): Payer: 59 | Admitting: Podiatry

## 2022-07-11 DIAGNOSIS — B353 Tinea pedis: Secondary | ICD-10-CM

## 2022-07-11 MED ORDER — KETOCONAZOLE 2 % EX CREA: 1.0000 | TOPICAL_CREAM | Freq: Two times a day (BID) | CUTANEOUS | 2 refills | Status: DC

## 2022-07-11 MED ORDER — LOSARTAN POTASSIUM 100 MG PO TABS: 100.0000 mg | ORAL_TABLET | Freq: Every day | ORAL | 11 refills | Status: DC

## 2022-07-11 NOTE — Addendum Note (Signed)
Addended by: Buddy Duty on: 07/11/2022 10:52 AM   Modules accepted: Orders

## 2022-07-12 ENCOUNTER — Telehealth: Payer: Self-pay | Admitting: *Deleted

## 2022-07-12 ENCOUNTER — Ambulatory Visit (HOSPITAL_COMMUNITY): Payer: 59 | Attending: Cardiology

## 2022-07-12 DIAGNOSIS — R072 Precordial pain: Secondary | ICD-10-CM | POA: Diagnosis not present

## 2022-07-12 DIAGNOSIS — I739 Peripheral vascular disease, unspecified: Secondary | ICD-10-CM

## 2022-07-12 LAB — MYOCARDIAL PERFUSION IMAGING
Estimated workload: 1
Exercise duration (min): 1 min
Exercise duration (sec): 0 s
LV dias vol: 139 mL (ref 62–150)
LV sys vol: 60 mL
MPHR: 101 {beats}/min
Nuc Stress EF: 57 %
Peak HR: 101 {beats}/min
Percent HR: 67 %
Rest HR: 64 {beats}/min
Rest Nuclear Isotope Dose: 11 mCi
SDS: 1
SRS: 0
SSS: 1
ST Depression (mm): 0 mm
Stress Nuclear Isotope Dose: 31 mCi
TID: 0.96

## 2022-07-12 MED ORDER — REGADENOSON 0.4 MG/5ML IV SOLN
0.4000 mg | Freq: Once | INTRAVENOUS | Status: AC
  Administered 2022-07-12: 0.4 mg via INTRAVENOUS

## 2022-07-12 MED ORDER — TECHNETIUM TC 99M TETROFOSMIN IV KIT
11.0000 | PACK | Freq: Once | INTRAVENOUS | Status: AC | PRN
  Administered 2022-07-12: 11 via INTRAVENOUS

## 2022-07-12 MED ORDER — TECHNETIUM TC 99M TETROFOSMIN IV KIT
31.0000 | PACK | Freq: Once | INTRAVENOUS | Status: AC | PRN
  Administered 2022-07-12: 31 via INTRAVENOUS

## 2022-07-12 NOTE — Telephone Encounter (Signed)
Patient is calling to confirm his appointment for 07/27/22.

## 2022-07-16 NOTE — Progress Notes (Signed)
  Subjective:  Patient ID: RYA ROEL, male    DOB: October 05, 1950,  MRN: [id number]  Chief Complaint  Patient presents with   Tinea Pedis    interdigital athlete's feet in one month    72 y.o. male presents with the above complaint. History confirmed with patient. He's been using powder and spray to control it, notes some improvement  Objective:  Physical Exam: warm, good capillary refill, no trophic changes or ulcerative lesions, normal DP and PT pulses, normal sensory exam, and sill visible interdigital tinea 3rd and 4th IS bilateral  Assessment:   1. Tinea pedis of both feet      Plan:  Patient was evaluated and treated and all questions answered.  Discussed the etiology and treatment options for tinea pedis.  Discussed topical and oral treatment.  Recommended topical treatment with 2% ketoconazole cream.  This was sent to the patient's pharmacy.  Also discussed appropriate foot hygiene, use of antifungal spray such as Tinactin in shoes, as well as cleaning her foot surfaces such as showers and bathroom floors with bleach.   Return if symptoms worsen or fail to improve.

## 2022-07-24 ENCOUNTER — Ambulatory Visit (INDEPENDENT_AMBULATORY_CARE_PROVIDER_SITE_OTHER): Payer: 59 | Admitting: Student

## 2022-07-24 VITALS — BP 140/70 | HR 66 | Temp 98.2°F | Ht 69.0 in | Wt 206.1 lb

## 2022-07-24 DIAGNOSIS — I5032 Chronic diastolic (congestive) heart failure: Secondary | ICD-10-CM

## 2022-07-24 DIAGNOSIS — I130 Hypertensive heart and chronic kidney disease with heart failure and stage 1 through stage 4 chronic kidney disease, or unspecified chronic kidney disease: Secondary | ICD-10-CM

## 2022-07-24 DIAGNOSIS — N1832 Chronic kidney disease, stage 3b: Secondary | ICD-10-CM | POA: Diagnosis not present

## 2022-07-24 DIAGNOSIS — F17210 Nicotine dependence, cigarettes, uncomplicated: Secondary | ICD-10-CM | POA: Diagnosis not present

## 2022-07-24 DIAGNOSIS — I739 Peripheral vascular disease, unspecified: Secondary | ICD-10-CM | POA: Diagnosis not present

## 2022-07-24 DIAGNOSIS — I10 Essential (primary) hypertension: Secondary | ICD-10-CM

## 2022-07-24 MED ORDER — HYDRALAZINE HCL 25 MG PO TABS: 25.0000 mg | ORAL_TABLET | Freq: Two times a day (BID) | ORAL | 0 refills | Status: DC

## 2022-07-24 MED ORDER — FUROSEMIDE 80 MG PO TABS: ORAL_TABLET | ORAL | 3 refills | Status: DC

## 2022-07-24 NOTE — Assessment & Plan Note (Signed)
Symptoms are not classic for PAD.  He does not tell story of claudication, but he may have an atypical experience.  Exam definitely consistent with peripheral arterial disease.  ABIs already ordered by cardiology, this is set up for this week.

## 2022-07-24 NOTE — Patient Instructions (Addendum)
Today we discussed high blood pressure and heart health.  Start taking furosemide 80 mg in the morning and 40 mg at night. Weigh yourself daily and call the doctor if you gain 3 lbs in a day or 5 lbs in a week.  Start taking hydralazine 25 mg twice daily.  Return in 2 weeks, for blood pressure, weight check, volume status exam.   Please call our clinic at [phone] Monday through Friday from 9 am to 4 pm if you have questions or concerns about your health. If after hours or on the weekend, call the main hospital number and ask for the Internal Medicine Resident On-Call. If you need medication refills, please notify your pharmacy one week in advance and they will send [place] a request.   Best, Marrianne Mood, MD [facility]

## 2022-07-24 NOTE — Assessment & Plan Note (Addendum)
BP elevated and signs of volume overload on exam.  No low output state suspected.  Currently compensating, but my concern is for indolent progression with the weight gain and nocturnal dyspnea, abdominal distention, lower extremity edema.  Will increase Lasix for few days, recommend daily weights at home.  Patient to call if he gains 3 pounds in a day or 5 pounds in a week.  Will add hydralazine per last cardiology note for afterload reduction. - Furosemide 80 mg every morning, 40 mg every afternoon x 3 days, to go back to 80 mg daily thereafter - Hydralazine 25 mg twice daily - Continue Coreg and empagliflozin

## 2022-07-24 NOTE — Assessment & Plan Note (Signed)
Has follow-up with Dr. Signe Colt of [facility] in a few days.

## 2022-07-24 NOTE — Assessment & Plan Note (Addendum)
Adherent to regimen of carvedilol 12.5 mg twice daily, losartan 100 mg daily.  Adjuncts include furosemide 80 mg daily and empagliflozin 10 mg daily.  Limited by this person to low EGFR of 30-40.  Will add low-dose hydralazine today.

## 2022-07-24 NOTE — Progress Notes (Signed)
    Subjective:  [name] is a 72 y.o. who presents to clinic for the following:  Overall feeling well.  Has concerns about leg swelling and discomfort.  Reports chronic leg swelling.  Also has discomfort, described as "pins-and-needles" sensation.  Tends to be worse when he walks, especially at the end of a quarter to half mile.  Sometimes he has this sensation in bed at night as well.  Denies cramping sensation.  On review of systems he reports nocturnal dyspnea, perhaps 1 or 2 times per month.  His weight has been on the rise, approximately 3 pounds in the last week.  He has abdominal distention, pants are fitting tighter recently.  Otherwise no orthopnea, chest pain, new or worsening shortness of breath.  Attempting to quit smoking.  Objective:   Vitals:   07/24/22 1028 07/24/22 1053  BP: (!) 146/80 (!) 140/70  Pulse: 66 66  Temp: 98.2 F (36.8 C)   TempSrc: Oral   SpO2: 100%   Weight: 206 lb 1.6 oz (93.5 kg)   Height: 5\' 9"  (1.753 m)     Physical Exam Overall well-appearing, robust Heart rate normal, rhythm is regular, no murmurs or gallops appreciated, JVP at the level of the clavicle, bilateral lower extremity capillary refill is brisk, strong radial pulses, could not palpate dorsalis pedis pulses Breathing is regular and unlabored on room air, no wheezing or crackles Abdomen is soft, nondistended, nontender Skin is warm and dry Bilateral lower extremity edema without pitting, skin appears taut, shiny, largely hairless Alert and oriented Pleasant, affect concordant  Assessment & Plan:  The primary encounter diagnosis was Uncontrolled hypertension. Diagnoses of Chronic diastolic congestive heart failure, PAD (peripheral artery disease), and Chronic kidney disease, stage 3b were also pertinent to this visit.  PAD (peripheral artery disease) Symptoms are not classic for PAD.  He does not tell story of claudication, but he may have an atypical experience.  Exam  definitely consistent with peripheral arterial disease.  ABIs already ordered by cardiology, this is set up for this week.  Uncontrolled hypertension Adherent to regimen of carvedilol 12.5 mg twice daily, losartan 100 mg daily.  Adjuncts include furosemide 80 mg daily and empagliflozin 10 mg daily.  Limited by this person to low EGFR of 30-40.  Will add low-dose hydralazine today.  Diastolic congestive heart failure (HCC) BP elevated and signs of volume overload on exam.  No low output state suspected.  Currently compensating, but my concern is for indolent progression with the weight gain and nocturnal dyspnea, abdominal distention, lower extremity edema.  Will increase Lasix for few days, recommend daily weights at home.  Patient to call if he gains 3 pounds in a day or 5 pounds in a week.  Will add hydralazine per last cardiology note for afterload reduction. - Furosemide 80 mg every morning, 40 mg every afternoon x 3 days, to go back to 80 mg daily thereafter - Hydralazine 25 mg twice daily - Continue Coreg and empagliflozin  Chronic kidney disease, stage 3b (HCC) Has follow-up with Dr. Signe Colt of [facility] in a few days.    Return in 2 weeks, for blood pressure, weight check, volume status exam.  Patient discussed with Dr. Elige Ko MD 07/24/2022, 3:38 PM  Pager: [phone]

## 2022-07-26 DIAGNOSIS — N390 Urinary tract infection, site not specified: Secondary | ICD-10-CM | POA: Diagnosis not present

## 2022-07-26 DIAGNOSIS — I129 Hypertensive chronic kidney disease with stage 1 through stage 4 chronic kidney disease, or unspecified chronic kidney disease: Secondary | ICD-10-CM | POA: Diagnosis not present

## 2022-07-26 DIAGNOSIS — N1832 Chronic kidney disease, stage 3b: Secondary | ICD-10-CM | POA: Diagnosis not present

## 2022-07-26 DIAGNOSIS — R7303 Prediabetes: Secondary | ICD-10-CM | POA: Diagnosis not present

## 2022-07-26 DIAGNOSIS — I5030 Unspecified diastolic (congestive) heart failure: Secondary | ICD-10-CM | POA: Diagnosis not present

## 2022-07-27 ENCOUNTER — Ambulatory Visit (HOSPITAL_COMMUNITY)
Admission: RE | Admit: 2022-07-27 | Discharge: 2022-07-27 | Disposition: A | Discharge status: Home / Self Care | Payer: 59 | Source: Ambulatory Visit | Attending: Cardiology | Admitting: Cardiology

## 2022-07-27 DIAGNOSIS — R072 Precordial pain: Secondary | ICD-10-CM | POA: Insufficient documentation

## 2022-07-27 DIAGNOSIS — I739 Peripheral vascular disease, unspecified: Secondary | ICD-10-CM | POA: Diagnosis not present

## 2022-07-27 LAB — VAS US ABI WITH/WO TBI
Left ABI: 1.17
Right ABI: 1.11

## 2022-07-27 NOTE — Progress Notes (Signed)
Internal Medicine Clinic Attending  Case discussed with the resident at the time of the visit.  We reviewed the resident's history and exam and pertinent patient test results.  I agree with the assessment, diagnosis, and plan of care documented in the resident's note.  

## 2022-08-01 LAB — AMB REFERRAL TO PROVIDER REFERRAL EXERCISE PROGRAM(P.R.E.P)
Albumin, Urine POC: 40.4
Creatinine, POC: 70.1 mg/dL

## 2022-08-07 ENCOUNTER — Ambulatory Visit (INDEPENDENT_AMBULATORY_CARE_PROVIDER_SITE_OTHER): Payer: 59 | Admitting: Student

## 2022-08-07 ENCOUNTER — Encounter: Payer: Self-pay | Admitting: Student

## 2022-08-07 ENCOUNTER — Other Ambulatory Visit: Payer: Self-pay

## 2022-08-07 VITALS — BP 138/67 | HR 62 | Temp 98.0°F | Ht 69.0 in | Wt 208.3 lb

## 2022-08-07 DIAGNOSIS — I5032 Chronic diastolic (congestive) heart failure: Secondary | ICD-10-CM | POA: Diagnosis not present

## 2022-08-07 DIAGNOSIS — I739 Peripheral vascular disease, unspecified: Secondary | ICD-10-CM | POA: Diagnosis not present

## 2022-08-07 DIAGNOSIS — I110 Hypertensive heart disease with heart failure: Secondary | ICD-10-CM | POA: Diagnosis not present

## 2022-08-07 DIAGNOSIS — R062 Wheezing: Secondary | ICD-10-CM | POA: Diagnosis not present

## 2022-08-07 DIAGNOSIS — I10 Essential (primary) hypertension: Secondary | ICD-10-CM

## 2022-08-07 MED ORDER — KETOCONAZOLE 2 % EX CREA: 1.0000 | TOPICAL_CREAM | Freq: Two times a day (BID) | CUTANEOUS | 2 refills | Status: DC

## 2022-08-07 MED ORDER — HYDRALAZINE HCL 50 MG PO TABS: 50.0000 mg | ORAL_TABLET | Freq: Two times a day (BID) | ORAL | 0 refills | Status: DC

## 2022-08-07 MED ORDER — CILOSTAZOL 100 MG PO TABS: 100.0000 mg | ORAL_TABLET | Freq: Two times a day (BID) | ORAL | 0 refills | Status: DC

## 2022-08-07 MED ORDER — ROSUVASTATIN CALCIUM 20 MG PO TABS: 20.0000 mg | ORAL_TABLET | Freq: Every day | ORAL | 3 refills | Status: DC

## 2022-08-07 MED ORDER — SPIRIVA RESPIMAT 2.5 MCG/ACT IN AERS: 2.0000 | INHALATION_SPRAY | Freq: Every day | RESPIRATORY_TRACT | 3 refills | Status: DC

## 2022-08-07 MED ORDER — FUROSEMIDE 80 MG PO TABS: ORAL_TABLET | ORAL | 3 refills | Status: DC

## 2022-08-07 NOTE — Assessment & Plan Note (Signed)
138/67 today.  Home measurements suggest poor control.  Heart rate around 60s to 70s.  Max dose ARB, low-dose beta-blocker, hydralazine.  Adjuncts include empagliflozin and furosemide.  Will increase hydralazine today. - Increase hydralazine to 50 mg twice daily - Continue carvedilol 12.5 mg twice daily - Continue losartan 100 mg daily

## 2022-08-07 NOTE — Addendum Note (Signed)
Addended by: Jessice Madill on: 08/07/2022 01:27 PM   Modules accepted: Level of Service  

## 2022-08-07 NOTE — Assessment & Plan Note (Signed)
Wonder about COPD in this person with dyspnea on exertion, smoking history, occupational asbestos exposure, and wheezing on exam.  Less likely that the dyspnea is from a cardiogenic cause as during today's visit he appears volume optimized but has not experienced any improvement in his breathing symptoms.  Will refer for PFTs and start an empiric LAMA. - Spiriva 2.5 mcg, 2 puffs daily

## 2022-08-07 NOTE — Assessment & Plan Note (Signed)
Appears euvolemic today.  Suspect lower extremity edema is related to skin changes from PAD rather than heart failure and volume overload.  Will continue working towards afterload reduction by controlling blood pressure and this person.  Commended him for his close monitoring of daily weights and BP at home.  Continue loop diuretics. - Furosemide 80 mg daily

## 2022-08-07 NOTE — Progress Notes (Signed)
Subjective:  Seth Santana is a 72 y.o. who presents to clinic for the following:  2-week follow-up for blood pressure check, weight, and volume status exam.  Patient is doing well since last visit.  Has measured his weight daily, ranges from 203-207 over the last 2 weeks.  BP log shows systolic as high as 172 and as low as 134.  Heart rate between 61 and 78.  Still with the leg pain with walking.  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  Still with some dyspnea on exertion.  Occasionally wheezes.  Has occasional "chunky" clear sputum production.  Currently vaping more than he is smoking.  Ports a long smoking history upwards of 50 years, half pack a day at his heaviest.  Also with some occupational exposures including asbestos.  Review of Systems  Respiratory:  Negative for hemoptysis.   Cardiovascular:  Negative for chest pain, orthopnea and PND.   Objective:   Vitals:   08/07/22 1001  BP: 138/67  Pulse: 62  Temp: 98 F (36.7 C)  TempSrc: Oral  SpO2: 100%  Weight: 208 lb 4.8 oz (94.5 kg)  Height: 5\' 9"  (1.753 m)    Physical Exam Well-appearing and robust Heart rate normal, regular rhythm, no murmurs appreciated, no appreciable JVD, strong radial pulses bilaterally Breathing is regular and unlabored on room air, notable expiratory wheeze in both lungs, no crackles Skin is warm and dry Bilateral lower extremity taut nonpitting edema, skin is shiny and hairless Alert and oriented, normal gait, normal coordination Pleasant with concordant affect  Assessment & Plan:   Wheezing Wonder about COPD in this person with dyspnea on exertion, smoking history, occupational asbestos exposure, and wheezing on exam.  Less likely that the dyspnea is from a cardiogenic cause as  during today's visit he appears volume optimized but has not experienced any improvement in his breathing symptoms.  Will refer for PFTs and start an empiric LAMA. - Spiriva 2.5 mcg, 2 puffs daily  PAD (peripheral artery disease) (HCC) Recent ABIs and arterial ultrasound confirm PAD.  Suspect this is the cause of his leg pain with walking.  Will refer for THN/ACO exercise program.  Will start a trial of cilostazol.  Increase statin, repeat lipids at follow-up.  Stressed importance of quitting smoking and vaping today.  Important that he maintain his follow-up with cardiology. - Rosuvastatin 20 mg daily - Cilostazol 100 mg twice daily  Diastolic congestive heart failure (HCC) Appears euvolemic today.  Suspect lower extremity edema is related to skin changes from PAD rather than heart failure and volume overload.  Will continue working towards afterload reduction by controlling blood pressure and this person.  Commended him for his close monitoring of daily weights and BP at home.  Continue loop diuretics. - Furosemide 80 mg daily  Uncontrolled hypertension 138/67 today.  Home measurements suggest poor control.  Heart rate around 60s to 70s.  Max dose ARB, low-dose beta-blocker, hydralazine.  Adjuncts include empagliflozin and furosemide.  Will increase hydralazine today. - Increase hydralazine to 50 mg twice daily - Continue carvedilol 12.5 mg twice daily - Continue losartan 100 mg daily    Return 6-8 weeks, for follow-up of claudication, wheezing, and blood pressure.  Patient discussed with Dr. Michel Bickers MD 08/07/2022, 11:57 AM  [phone]

## 2022-08-07 NOTE — Assessment & Plan Note (Signed)
Recent ABIs and arterial ultrasound confirm PAD.  Suspect this is the cause of his leg pain with walking.  Will refer for THN/ACO exercise program.  Will start a trial of cilostazol.  Increase statin, repeat lipids at follow-up.  Stressed importance of quitting smoking and vaping today.  Important that he maintain his follow-up with cardiology. - Rosuvastatin 20 mg daily - Cilostazol 100 mg twice daily  Addendum 08/09/22: Will discontinue cilostazol trial due to contraindication in heart failure of any severity.

## 2022-08-08 ENCOUNTER — Ambulatory Visit: Payer: 59 | Admitting: Cardiovascular Disease

## 2022-08-09 ENCOUNTER — Telehealth: Payer: Self-pay | Admitting: *Deleted

## 2022-08-09 NOTE — Telephone Encounter (Signed)
Contacted regarding [facility] referral. Spoke with patients daughter Seth Santana about the program. Seth Santana was unavailable to come to the phone and is [facility]. Seth Santana will relay the information to her father and will call me back if he is interested in participating.

## 2022-08-09 NOTE — Addendum Note (Signed)
Addended by: Marrianne Mood on: 08/09/2022 08:31 AM   Modules accepted: Orders

## 2022-08-15 NOTE — Progress Notes (Signed)
Internal Medicine Clinic Attending  Case discussed with Dr. McLendon  At the time of the visit.  We reviewed the resident's history and exam and pertinent patient test results.  I agree with the assessment, diagnosis, and plan of care documented in the resident's note.  

## 2022-08-21 NOTE — Progress Notes (Unsigned)
Cardiology Office Note   Date:  08/22/2022   ID:  Seth Santana, DOB [birth year], MRN [id number]  PCP:  Chauncey Mann, DO  Cardiologist:  Dr. Izora Ribas  No chief complaint on file.     History of Present Illness: Seth Santana is a 72 y.o. male who was referred for evaluation of peripheral arterial disease. He has known history of chronic diastolic heart failure, essential hypertension, stage IIIb chronic kidney disease, tobacco use and hyperlipidemia. He reports low back discomfort with radiation to his left leg.  No lower extremity ulceration.  He is able to walk long distance without having to stop. He underwent noninvasive vascular evaluation last month which showed normal ABI and TBI.  Duplex on the right showed moderate SFA disease and moderate disease in the TP trunk.  On the left, there was moderate SFA disease with an occluded peroneal artery. He denies chest pain or worsening dyspnea. He reports lower extremity edema that improved after stopping amlodipine.  Past Medical History:  Diagnosis Date   Blood in stool last few years   BPH (benign prostatic hyperplasia)    Hard of hearing    Headache(784.0)    Hypertension     Past Surgical History:  Procedure Laterality Date   COLONOSCOPY WITH PROPOFOL N/A 10/09/2013   Procedure: COLONOSCOPY WITH PROPOFOL;  Surgeon: Charolett Bumpers, MD;  Location: [facility];  Service: Endoscopy;  Laterality: N/A;   NO PAST SURGERIES     ORCHIECTOMY Right 03/15/2022   Procedure: INGUINAL RADICAL ORCHIECTOMY;  Surgeon: Sebastian Ache, MD;  Location: [facility];  Service: Urology;  Laterality: Right;     Current Outpatient Medications  Medication Sig Dispense Refill   acetaminophen (TYLENOL) 500 MG tablet Take 500 mg by mouth every 6 (six) hours as needed for moderate pain.     augmented betamethasone dipropionate (DIPROLENE-AF) 0.05 % cream as needed (dry skin).     carvedilol (COREG) 12.5 MG tablet TAKE 1 TABLET BY MOUTH  TWICE  DAILY WITH MEALS 200 tablet 2   Cyanocobalamin (B-12 PO) Take 1 tablet by mouth daily as needed (energy).     empagliflozin (JARDIANCE) 10 MG TABS tablet TAKE 1 TABLET BY MOUTH DAILY  BEFORE BREAKFAST 100 tablet 3   ferrous sulfate 325 (65 FE) MG EC tablet Take 1 tablet (325 mg total) by mouth 2 (two) times daily. 180 tablet 1   finasteride (PROSCAR) 5 MG tablet TAKE 1 TABLET BY MOUTH DAILY 100 tablet 2   furosemide (LASIX) 80 MG tablet Take 80mg  in the morning. 90 tablet 3   gabapentin (NEURONTIN) 300 MG capsule Take 300 mg by mouth 2 (two) times daily.     hydrALAZINE (APRESOLINE) 50 MG tablet Take 1 tablet (50 mg total) by mouth in the morning and at bedtime. 60 tablet 0   hydrOXYzine (ATARAX) 10 MG tablet TAKE 1 TABLET BY MOUTH EVERY 8  HOURS AS NEEDED FOR ITCHING 60 tablet 0   ketoconazole (NIZORAL) 2 % cream Apply 1 Application topically 2 (two) times daily. Apply between toes thin layer twice a day 60 g 2   losartan (COZAAR) 100 MG tablet Take 1 tablet (100 mg total) by mouth daily. 30 tablet 11   Multiple Vitamins-Minerals (MENS 50+ MULTIVITAMIN) TABS Take 1 tablet by mouth daily.     omeprazole (PRILOSEC) 40 MG capsule TAKE 1 CAPSULE BY MOUTH DAILY 100 capsule 2   rosuvastatin (CRESTOR) 20 MG tablet Take 1 tablet (20 mg total) by mouth  daily. 90 tablet 3   senna-docusate (SENOKOT-S) 8.6-50 MG tablet Take 1 tablet by mouth 2 (two) times daily. While taking strong pain meds to prevent constipation 10 tablet 0   Tiotropium Bromide Monohydrate (SPIRIVA RESPIMAT) 2.5 MCG/ACT AERS Inhale 2 puffs into the lungs daily. 4 g 3   triamcinolone ointment (KENALOG) 0.1 % APPLY TOPICALLY TO AFFECTED  AREA(S) TWICE DAILY AS NEEDED  FOR ITCHING 30 g 0   FIBER PO Take 1 tablet by mouth daily as needed (constipation). (Patient not taking: Reported on 08/22/2022)     No current facility-administered medications for this visit.    Allergies:   Patient has no known allergies.    Social History:   The patient  reports that he has been smoking cigarettes. He has a 7.50 pack-year smoking history. He has never used smokeless tobacco. He reports current alcohol use. He reports current drug use. Drug: Marijuana.   Family History:  The patient's family history includes Cancer in his brother; Diabetes in his mother; Kidney disease in his mother.    ROS:  Please see the history of present illness.   Otherwise, review of systems are positive for none.   All other systems are reviewed and negative.    PHYSICAL EXAM: VS:  BP 118/62 (BP Location: Left Arm, Patient Position: Sitting, Cuff Size: Normal)   Pulse 76   Ht 5\' 9"  (1.753 m)   Wt 206 lb 6.4 oz (93.6 kg)   SpO2 96%   BMI 30.48 kg/m  , BMI Body mass index is 30.48 kg/m. GEN: Well nourished, well developed, in no acute distress  HEENT: normal  Neck: no JVD, carotid bruits, or masses Cardiac: RRR; no murmurs, rubs, or gallops, mild to moderate bilateral lower extremity edema Respiratory:  clear to auscultation bilaterally, normal work of breathing GI: soft, nontender, nondistended, + BS MS: no deformity or atrophy  Skin: warm and dry, no rash Neuro:  Strength and sensation are intact Psych: euthymic mood, full affect Vascular: Femoral pulses are normal.  Distal pulses are palpable.   EKG:  EKG is not ordered today.    Recent Labs: 02/16/2022: ALT 20; TSH 4.159 03/16/2022: B Natriuretic Peptide 217.3 03/17/2022: Magnesium 2.1 03/22/2022: Hemoglobin 8.6; Platelets 407 07/03/2022: BUN 34; Creatinine, Ser 2.01; Potassium 4.4; Sodium 140    Lipid Panel    Component Value Date/Time   CHOL 200 (H) 07/03/2022 1015   TRIG 249 (H) 07/03/2022 1015   HDL 37 (L) 07/03/2022 1015   CHOLHDL 5.4 (H) 07/03/2022 1015   CHOLHDL 6.1 05/23/2013 1613   VLDL 78 (H) 05/23/2013 1613   LDLCALC 119 (H) 07/03/2022 1015      Wt Readings from Last 3 Encounters:  08/22/22 206 lb 6.4 oz (93.6 kg)  08/07/22 208 lb 4.8 oz (94.5 kg)  07/24/22 206 lb  1.6 oz (93.5 kg)          No data to display            ASSESSMENT AND PLAN:  1.  Peripheral arterial disease: The patient has evidence of nonobstructive peripheral arterial disease.  I do not think his leg symptoms are related to claudication and most likely are related to a lumbar spine etiology.  His femoral pulses are normal and distal pulses are palpable. I discussed with him the natural history of asymptomatic peripheral arterial disease and recommend aggressive treatment of risk factors especially smoking cessation.  I also recommend adding aspirin 81 mg once daily.  2.  Chronic  diastolic heart failure: He appears to be euvolemic on furosemide 80 mg once daily.  3.  Essential hypertension: Blood pressure is well-controlled.  4.  Hyperlipidemia: Continue treatment with a statin with a target LDL of less than 70 due to peripheral arterial disease.  5.  Tobacco use: I discussed importance of smoking cessation.   Disposition:   FU with me as needed  Signed,  Lorine Bears, MD  08/22/2022 8:32 AM    [facility]

## 2022-08-22 ENCOUNTER — Ambulatory Visit: Payer: 59 | Attending: Cardiovascular Disease | Admitting: Cardiovascular Disease

## 2022-08-22 ENCOUNTER — Encounter: Payer: Self-pay | Admitting: Cardiovascular Disease

## 2022-08-22 VITALS — BP 118/62 | HR 76 | Ht 69.0 in | Wt 206.4 lb

## 2022-08-22 DIAGNOSIS — I5032 Chronic diastolic (congestive) heart failure: Secondary | ICD-10-CM | POA: Diagnosis not present

## 2022-08-22 DIAGNOSIS — Z720 Tobacco use: Secondary | ICD-10-CM | POA: Diagnosis not present

## 2022-08-22 DIAGNOSIS — I10 Essential (primary) hypertension: Secondary | ICD-10-CM | POA: Diagnosis not present

## 2022-08-22 DIAGNOSIS — I739 Peripheral vascular disease, unspecified: Secondary | ICD-10-CM

## 2022-08-22 DIAGNOSIS — E785 Hyperlipidemia, unspecified: Secondary | ICD-10-CM | POA: Diagnosis not present

## 2022-08-22 MED ORDER — ASPIRIN 81 MG PO TBEC: 81.0000 mg | DELAYED_RELEASE_TABLET | Freq: Every day | ORAL | 3 refills | Status: AC

## 2022-08-22 NOTE — Patient Instructions (Signed)
Medication Instructions:  START Aspirin 81 mg once daily  *If you need a refill on your cardiac medications before your next appointment, please call your pharmacy*   Lab Work: None ordered If you have labs (blood work) drawn today and your tests are completely normal, you will receive your results only by: MyChart Message (if you have MyChart) OR A paper copy in the mail If you have any lab test that is abnormal or we need to change your treatment, we will call you to review the results.   Testing/Procedures: None ordered   Follow-Up: At [facility], you and your health needs are our priority.  As part of our continuing mission to provide you with exceptional heart care, we have created designated Provider Care Teams.  These Care Teams include your primary Cardiologist (physician) and Advanced Practice Providers (APPs -  Physician Assistants and Nurse Practitioners) who all work together to provide you with the care you need, when you need it.  We recommend signing up for the patient portal called "MyChart".  Sign up information is provided on this After Visit Summary.  MyChart is used to connect with patients for Virtual Visits (Telemedicine).  Patients are able to view lab/test results, encounter notes, upcoming appointments, etc.  Non-urgent messages can be sent to your provider as well.   To learn more about what you can do with MyChart, go to [email or web address].    Your next appointment:   Follow up as needed with Dr. Kirke Corin Other Instructions Managing the Challenge of Quitting Smoking Quitting smoking is a physical and mental challenge. You may have cravings, withdrawal symptoms, and temptation to smoke. Before quitting, work with your health care provider to make a plan that can help you manage quitting. Making a plan before you quit may keep you from smoking when you have the urge to smoke while trying to quit. How to manage lifestyle changes Managing  stress Stress can make you want to smoke, and wanting to smoke may cause stress. It is important to find ways to manage your stress. You could try some of the following: Practice relaxation techniques. Breathe slowly and deeply, in through your nose and out through your mouth. Listen to music. Soak in a bath or take a shower. Imagine a peaceful place or vacation. Get some support. Talk with family or friends about your stress. Join a support group. Talk with a counselor or therapist. Get some physical activity. Go for a walk, run, or bike ride. Play a favorite sport. Practice yoga.  Medicines Talk with your health care provider about medicines that might help you deal with cravings and make quitting easier for you. Relationships Social situations can be difficult when you are quitting smoking. To manage this, you can: Avoid parties and other social situations where people might be smoking. Avoid alcohol. Leave right away if you have the urge to smoke. Explain to your family and friends that you are quitting smoking. Ask for support and let them know you might be a bit grumpy. Plan activities where smoking is not an option. General instructions Be aware that many people gain weight after they quit smoking. However, not everyone does. To keep from gaining weight, have a plan in place before you quit, and stick to the plan after you quit. Your plan should include: Eating healthy snacks. When you have a craving, it may help to: Eat popcorn, or try carrots, celery, or other cut vegetables. Chew sugar-free gum. Changing how you eat. Eat  small portion sizes at meals. Eat 4-6 small meals throughout the day instead of 1-2 large meals a day. Be mindful when you eat. You should avoid watching television or doing other things that might distract you as you eat. Exercising regularly. Make time to exercise each day. If you do not have time for a long workout, do short bouts of exercise for 5-10  minutes several times a day. Do some form of strengthening exercise, such as weight lifting. Do some exercise that gets your heart beating and causes you to breathe deeply, such as walking fast, running, swimming, or biking. This is very important. Drinking plenty of water or other low-calorie or no-calorie drinks. Drink enough fluid to keep your urine pale yellow.  How to recognize withdrawal symptoms Your body and mind may experience discomfort as you try to get used to not having nicotine in your system. These effects are called withdrawal symptoms. They may include: Feeling hungrier than normal. Having trouble concentrating. Feeling irritable or restless. Having trouble sleeping. Feeling depressed. Craving a cigarette. These symptoms may surprise you, but they are normal to have when quitting smoking. To manage withdrawal symptoms: Avoid places, people, and activities that trigger your cravings. Remember why you want to quit. Get plenty of sleep. Avoid coffee and other drinks that contain caffeine. These may worsen some of your symptoms. How to manage cravings Come up with a plan for how to deal with your cravings. The plan should include the following: A definition of the specific situation you want to deal with. An activity or action you will take to replace smoking. A clear idea for how this action will help. The name of someone who could help you with this. Cravings usually last for 5-10 minutes. Consider taking the following actions to help you with your plan to deal with cravings: Keep your mouth busy. Chew sugar-free gum. Suck on hard candies or a straw. Brush your teeth. Keep your hands and body busy. Change to a different activity right away. Squeeze or play with a ball. Do an activity or a hobby, such as making bead jewelry, practicing needlepoint, or working with wood. Mix up your normal routine. Take a short exercise break. Go for a quick walk, or run up and down  stairs. Focus on doing something kind or helpful for someone else. Call a friend or family member to talk during a craving. Join a support group. Contact a quitline. Where to find support To get help or find a support group: Call the National Cancer Institute's Smoking Quitline: 1-800-QUIT-NOW (414) 527-9233) Text QUIT to SmokefreeTXT: 454098 Where to find more information Visit these websites to find more information on quitting smoking: U.S. Department of Health and Human Services: www.smokefree.gov American Lung Association: www.freedomfromsmoking.org Centers for Disease Control and Prevention (CDC): FootballExhibition.com.br American Heart Association: www.heart.org Contact a health care provider if: You want to change your plan for quitting. The medicines you are taking are not helping. Your eating feels out of control or you cannot sleep. You feel depressed or become very anxious. Summary Quitting smoking is a physical and mental challenge. You will face cravings, withdrawal symptoms, and temptation to smoke again. Preparation can help you as you go through these challenges. Try different techniques to manage stress, handle social situations, and prevent weight gain. You can deal with cravings by keeping your mouth busy (such as by chewing gum), keeping your hands and body busy, calling family or friends, or contacting a quitline for people who want to quit  smoking. You can deal with withdrawal symptoms by avoiding places where people smoke, getting plenty of rest, and avoiding drinks that contain caffeine. This information is not intended to replace advice given to you by your health care provider. Make sure you discuss any questions you have with your health care provider. Document Revised: 03/18/2021 Document Reviewed: 03/18/2021 Elsevier Patient Education  2023 [employer].

## 2022-08-27 ENCOUNTER — Other Ambulatory Visit: Payer: Self-pay | Admitting: Student

## 2022-09-18 ENCOUNTER — Encounter: Payer: Self-pay | Admitting: Student

## 2022-09-18 ENCOUNTER — Ambulatory Visit (INDEPENDENT_AMBULATORY_CARE_PROVIDER_SITE_OTHER): Payer: 59 | Admitting: Student

## 2022-09-18 ENCOUNTER — Other Ambulatory Visit: Payer: Self-pay

## 2022-09-18 VITALS — BP 129/62 | HR 52 | Temp 97.9°F | Ht 69.0 in | Wt 206.2 lb

## 2022-09-18 DIAGNOSIS — N1832 Chronic kidney disease, stage 3b: Secondary | ICD-10-CM | POA: Diagnosis not present

## 2022-09-18 DIAGNOSIS — R062 Wheezing: Secondary | ICD-10-CM

## 2022-09-18 DIAGNOSIS — D126 Benign neoplasm of colon, unspecified: Secondary | ICD-10-CM | POA: Diagnosis not present

## 2022-09-18 DIAGNOSIS — I130 Hypertensive heart and chronic kidney disease with heart failure and stage 1 through stage 4 chronic kidney disease, or unspecified chronic kidney disease: Secondary | ICD-10-CM | POA: Diagnosis not present

## 2022-09-18 DIAGNOSIS — R7303 Prediabetes: Secondary | ICD-10-CM

## 2022-09-18 DIAGNOSIS — I5032 Chronic diastolic (congestive) heart failure: Secondary | ICD-10-CM | POA: Diagnosis not present

## 2022-09-18 DIAGNOSIS — I10 Essential (primary) hypertension: Secondary | ICD-10-CM

## 2022-09-18 DIAGNOSIS — I739 Peripheral vascular disease, unspecified: Secondary | ICD-10-CM | POA: Diagnosis not present

## 2022-09-18 MED ORDER — ROSUVASTATIN CALCIUM 40 MG PO TABS: 40.0000 mg | ORAL_TABLET | Freq: Every day | ORAL | 2 refills | Status: DC

## 2022-09-18 NOTE — Assessment & Plan Note (Signed)
Euvolemic on exam. Follows cardiology. Continue lasix 80mg  daily and jardiance 10mg  daily.

## 2022-09-18 NOTE — Assessment & Plan Note (Addendum)
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 On jardiance 10mg  daily. He follows Dr. Signe Colt at [facility].

## 2022-09-18 NOTE — Patient Instructions (Signed)
Seth Santana,  It was a pleasure seeing you in the clinic today.   We are increasing your cholesterol medicine (Crestor) to 40mg  daily.  I have placed a referral to the stomach doctor for your colonoscopy. They will contact you to schedule this. I have placed a referral to the eye doctor as well. They will call you to schedule this. Please come back in 3 months for your next visit.  Please call our clinic at [phone] if you have any questions or concerns. The best time to call is Monday-Friday from 9am-4pm, but there is someone available 24/7 at the same number. If you need medication refills, please notify your pharmacy one week in advance and they will send [place] a request.   Thank you for letting us take part in your care. We look forward to seeing you next time!

## 2022-09-18 NOTE — Progress Notes (Signed)
Internal Medicine Clinic Attending  Case discussed with the resident at the time of the visit.  We reviewed the resident's history and exam and pertinent patient test results.  I agree with the assessment, diagnosis, and plan of care documented in the resident's note.  

## 2022-09-18 NOTE — Assessment & Plan Note (Signed)
Patient on coreg 12.5mg  BID, hydralazine 50mg  BID, losartan 100mg  daily, jardiance 10mg  daily, and lasix 80mg  daily. BP 159/65, but repeat much better at 129/62. At goal.  -continue current medications

## 2022-09-18 NOTE — Assessment & Plan Note (Signed)
Had a colonoscopy in 2019 which revealed 6 small polyps in his descending and ascending colon, removed with cold snare and found to be tubular adenomas. Repeat colonoscopy was recommended 3 years after. He is agreeable with repeat colonoscopy today so sent referral to GI for this.

## 2022-09-18 NOTE — Assessment & Plan Note (Signed)
He is finding some benefit with spiriva. No wheezing on exam today. PFTs are pending, he has not yet received a call to schedule this. Have asked Chilon to look into this for scheduling purposes.

## 2022-09-18 NOTE — Assessment & Plan Note (Signed)
Seen by cardiology and felt to be nonobstructive PAD. He is on crestor 20mg  daily and ASA 81mg  daily. Recent LDL was 119 so will increase crestor to 40mg  daily to bring LDL to goal.  -continue ASA -increase crestor to 40mg  daily -repeat lipid panel in 3-6 months

## 2022-09-18 NOTE — Progress Notes (Signed)
   CC: f/u HTN  HPI:  [name] is a 72 y.o. male with history listed below presenting to the [facility] for f/u HTN. Please see individualized problem based charting for full HPI.  Past Medical History:  Diagnosis Date   Blood in stool last few years   BPH (benign prostatic hyperplasia)    Hard of hearing    Headache(784.0)    Hypertension     Review of Systems:  Negative aside from that listed in individualized problem based charting.  Physical Exam:  Vitals:   09/18/22 0852 09/18/22 0900  BP: (!) 159/65 129/62  Pulse: 60 (!) 52  Temp: 97.9 F (36.6 C)   TempSrc: Oral   SpO2: 100%   Weight: 206 lb 3.2 oz (93.5 kg)   Height: 5\' 9"  (1.753 m)    Physical Exam Constitutional:      Appearance: Normal appearance. He is obese. He is not ill-appearing.  HENT:     Mouth/Throat:     Mouth: Mucous membranes are moist.     Pharynx: Oropharynx is clear. No oropharyngeal exudate.  Eyes:     General: No scleral icterus.    Extraocular Movements: Extraocular movements intact.     Conjunctiva/sclera: Conjunctivae normal.     Pupils: Pupils are equal, round, and reactive to light.  Cardiovascular:     Rate and Rhythm: Normal rate and regular rhythm.     Heart sounds: Normal heart sounds. No murmur heard.    No friction rub. No gallop.  Pulmonary:     Effort: Pulmonary effort is normal.     Breath sounds: Normal breath sounds. No wheezing, rhonchi or rales.  Abdominal:     General: Bowel sounds are normal. There is no distension.     Palpations: Abdomen is soft.     Tenderness: There is no abdominal tenderness.  Musculoskeletal:        General: No swelling. Normal range of motion.  Skin:    General: Skin is warm and dry.  Neurological:     General: No focal deficit present.     Mental Status: He is alert and oriented to person, place, and time.  Psychiatric:        Mood and Affect: Mood normal.        Behavior: Behavior normal.      Assessment & Plan:   See  Encounters Tab for problem based charting.  Patient discussed with Dr. Mikey Bussing

## 2022-09-21 DIAGNOSIS — I872 Venous insufficiency (chronic) (peripheral): Secondary | ICD-10-CM | POA: Diagnosis not present

## 2022-09-25 ENCOUNTER — Ambulatory Visit (HOSPITAL_COMMUNITY)
Admission: RE | Admit: 2022-09-25 | Discharge: 2022-09-25 | Disposition: A | Discharge status: Home / Self Care | Payer: 59 | Source: Ambulatory Visit | Attending: Internal Medicine | Admitting: Internal Medicine

## 2022-09-25 DIAGNOSIS — R062 Wheezing: Secondary | ICD-10-CM | POA: Diagnosis not present

## 2022-09-25 LAB — PULMONARY FUNCTION TEST
DL/VA % pred: 91 %
DL/VA: 3.7 ml/min/mmHg/L
DLCO unc % pred: 69 %
DLCO unc: 17.41 ml/min/mmHg
FEF 25-75 Post: 2.58 L/sec
FEF 25-75 Pre: 3.3 L/sec
FEF2575-%Change-Post: -21 %
FEF2575-%Pred-Post: 108 %
FEF2575-%Pred-Pre: 138 %
FEV1-%Change-Post: -1 %
FEV1-%Pred-Post: 79 %
FEV1-%Pred-Pre: 80 %
FEV1-Post: 2.48 L
FEV1-Pre: 2.51 L
FEV1FVC-%Change-Post: -1 %
FEV1FVC-%Pred-Pre: 115 %
FEV6-%Change-Post: 0 %
FEV6-%Pred-Post: 73 %
FEV6-%Pred-Pre: 73 %
FEV6-Post: 2.97 L
FEV6-Pre: 2.95 L
FEV6FVC-%Change-Post: 0 %
FEV6FVC-%Pred-Post: 106 %
FEV6FVC-%Pred-Pre: 105 %
FVC-%Change-Post: 0 %
FVC-%Pred-Post: 69 %
FVC-%Pred-Pre: 69 %
FVC-Post: 2.97 L
FVC-Pre: 2.96 L
Post FEV1/FVC ratio: 84 %
Post FEV6/FVC ratio: 100 %
Pre FEV1/FVC ratio: 85 %
Pre FEV6/FVC Ratio: 100 %
RV % pred: 87 %
RV: 2.1 L
TLC % pred: 76 %
TLC: 5.25 L

## 2022-09-25 MED ORDER — ALBUTEROL SULFATE (2.5 MG/3ML) 0.083% IN NEBU
2.5000 mg | INHALATION_SOLUTION | Freq: Once | RESPIRATORY_TRACT | Status: AC
  Administered 2022-09-25: 2.5 mg via RESPIRATORY_TRACT

## 2022-09-29 ENCOUNTER — Other Ambulatory Visit: Payer: Self-pay

## 2022-09-29 MED ORDER — FUROSEMIDE 80 MG PO TABS: 80.0000 mg | ORAL_TABLET | Freq: Every day | ORAL | 3 refills | Status: DC

## 2022-09-29 NOTE — Telephone Encounter (Signed)
As indicated below per Dr. Debby Bud last OV note with the pt, he mentioned lasix dosing of 80 mg po daily.  Ok to refill.    ASSESSMENT:     1. Precordial chest pain   2. PAD (peripheral artery disease) (HCC)   3. 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 Signe Colt) - NYHA class III, Stage B, hypervolemic, etiology is multi-factorial - Diuretic regimen: Lasix 80 mg PO Daily and 40 mg PO PM for 3 days, then 80 mg PO daily   - Discussed the importance of fluid restriction of < 2 L, salt restriction, and checking daily weights  -given HTN - continue ARB as per nephrology - GFR too low for MRA - SGLT2i continue - add hydralazine 25 mg PO BID for afterload reduction  - alcohol cessation discussed   Chest pain syndrome - cannot get regadenosin due to active wheeze - cannot get CCTA - exercise NM Stress test   Obstructive lung disease Active smoking - smoking cessation discussed   HLD Aortic atherosclerosis PAD - LDL is above goal increase statin and albs in 3 months - ABI and duplex - reviewed prior CT with team   Mild aortic dilation - echo in 03/2023   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

## 2022-09-29 NOTE — Telephone Encounter (Signed)
[facility] mail order pharmacy is requesting a refill on furosemide 80 mg tablet. Would Dr. Izora Ribas like to reorder this medication? Please address

## 2022-10-05 ENCOUNTER — Ambulatory Visit: Payer: 59 | Attending: Internal Medicine

## 2022-10-05 DIAGNOSIS — I700 Atherosclerosis of aorta: Secondary | ICD-10-CM | POA: Diagnosis not present

## 2022-10-06 LAB — LIPID PANEL
Chol/HDL Ratio: 5.3 ratio — ABNORMAL HIGH (ref 0.0–5.0)
Cholesterol, Total: 174 mg/dL (ref 100–199)
HDL: 33 mg/dL — ABNORMAL LOW (ref >39)
LDL Chol Calc (NIH): 102 mg/dL — ABNORMAL HIGH (ref 0–99)
Triglycerides: 226 mg/dL — ABNORMAL HIGH (ref 0–149)
VLDL Cholesterol Cal: 39 mg/dL (ref 5–40)

## 2022-10-06 LAB — ALT: ALT: 16 IU/L (ref 0–44)

## 2022-10-08 ENCOUNTER — Other Ambulatory Visit: Payer: Self-pay | Admitting: Student

## 2022-10-11 ENCOUNTER — Telehealth: Payer: Self-pay

## 2022-10-11 DIAGNOSIS — E785 Hyperlipidemia, unspecified: Secondary | ICD-10-CM

## 2022-10-11 DIAGNOSIS — I739 Peripheral vascular disease, unspecified: Secondary | ICD-10-CM

## 2022-10-11 DIAGNOSIS — I700 Atherosclerosis of aorta: Secondary | ICD-10-CM

## 2022-10-11 MED ORDER — EZETIMIBE 10 MG PO TABS: 10.0000 mg | ORAL_TABLET | Freq: Every day | ORAL | 3 refills | Status: DC

## 2022-10-11 NOTE — Telephone Encounter (Signed)
-----   Message from Christell Constant, MD sent at 10/06/2022 11:11 AM EDT ----- Results: LDL above goal for PAD Plan: Zetia 10 mg and labs in 3 months  Christell Constant, MD

## 2022-10-11 NOTE — Telephone Encounter (Signed)
The patient and daughter Asher Muir have been notified of the result and verbalized understanding.  All questions (if any) were answered. Macie Burows, RN 10/11/2022 4:18 PM   FLP scheduled for 01/15/23  Daughter requesting results of PFT. Advised Dr. Izora Ribas did not order testing.  Advised to reach out to PCP for f/u. No further questions at this time.

## 2022-10-13 NOTE — Progress Notes (Unsigned)
Office Visit    Patient Name: Seth Santana Date of Encounter: 10/13/2022  Primary Care Provider:  Chauncey Mann, DO Primary Cardiologist:  Christell Constant, MD Primary Electrophysiologist: None   Past Medical History    Past Medical History:  Diagnosis Date   Blood in stool last few years   BPH (benign prostatic hyperplasia)    Closed fracture of tripod (HCC) 02/18/2013   Hard of hearing    Headache(784.0)    Hypertension    Past Surgical History:  Procedure Laterality Date   COLONOSCOPY WITH PROPOFOL N/A 10/09/2013   Procedure: COLONOSCOPY WITH PROPOFOL;  Surgeon: Charolett Bumpers, MD;  Location: [facility] ENDOSCOPY;  Service: Endoscopy;  Laterality: N/A;   NO PAST SURGERIES     ORCHIECTOMY Right 03/15/2022   Procedure: INGUINAL RADICAL ORCHIECTOMY;  Surgeon: Sebastian Ache, MD;  Location: [facility] ORS;  Service: Urology;  Laterality: Right;    Allergies  No Known Allergies   History of Present Illness    Seth Santana  is a 72 year old male with a PMH of HFpEF, HTN, CKD stage IIIb, PAD who presents today for 57-month follow-up.  Seth Santana was seen initially by Dr. Lenor Derrick on 07/04/2022 for evaluation of chest pain.  He reported chest pain at his PCP with no radiation or provoking and also endorsed mild shortness of breath.  He was sent for nuclear med stress test that showed no evidence of ischemia and was low risk.  He also underwent ABIs and duplex that showed 30-49% stenosis bilaterally.  He was referred to Dr. Kirke Corin and was seen on 08/22/22. He was found to have normal pulses bilaterally and low back pain was thought to be spinal in etiology.  He was started on ASA 81 mg daily with no further medication changes or recommendations at that time.  Seth Santana presents today with his daughter for 25-month follow-up.  Since last being seen in the office patient reports that he has been doing much better and chest discomfort has reduced to a few episodes that last a  second or 2.  He denies any chest pain with exertion.  He does continue to have pain with ambulation but he correlates the pain occurring only when smoking.  He is currently smoking 1 cigarette a day which is down from 1 pack/week.  He is also drinking 2 beers once or twice per week which is down from every day.  He reports limitations with activity due to back pain and foot and ankle discomfort.  He is euvolemic on examination today.  He is tolerating his current medications without any adverse reactions currently.  Patient denies chest pain, palpitations, dyspnea, PND, orthopnea, nausea, vomiting, dizziness, syncope, edema, weight gain, or early satiety.   Home Medications    Current Outpatient Medications  Medication Sig Dispense Refill   acetaminophen (TYLENOL) 500 MG tablet Take 500 mg by mouth every 6 (six) hours as needed for moderate pain.     aspirin EC 81 MG tablet Take 1 tablet (81 mg total) by mouth daily. Swallow whole. 90 tablet 3   augmented betamethasone dipropionate (DIPROLENE-AF) 0.05 % cream as needed (dry skin).     carvedilol (COREG) 12.5 MG tablet TAKE 1 TABLET BY MOUTH TWICE  DAILY WITH MEALS 200 tablet 2   Cyanocobalamin (B-12 PO) Take 1 tablet by mouth daily as needed (energy).     empagliflozin (JARDIANCE) 10 MG TABS tablet TAKE 1 TABLET BY MOUTH DAILY  BEFORE BREAKFAST  100 tablet 3   ezetimibe (ZETIA) 10 MG tablet Take 1 tablet (10 mg total) by mouth daily. 90 tablet 3   ferrous sulfate 325 (65 FE) MG EC tablet Take 1 tablet (325 mg total) by mouth 2 (two) times daily. 180 tablet 1   FIBER PO Take 1 tablet by mouth daily as needed (constipation). (Patient not taking: Reported on 08/22/2022)     finasteride (PROSCAR) 5 MG tablet TAKE 1 TABLET BY MOUTH DAILY 100 tablet 2   furosemide (LASIX) 80 MG tablet Take 1 tablet (80 mg total) by mouth daily. morning 90 tablet 3   gabapentin (NEURONTIN) 300 MG capsule Take 300 mg by mouth 2 (two) times daily.     hydrALAZINE  (APRESOLINE) 50 MG tablet TAKE 1 TABLET BY MOUTH IN THE  MORNING AND AT BEDTIME 60 tablet 11   hydrOXYzine (ATARAX) 10 MG tablet TAKE 1 TABLET BY MOUTH EVERY 8  HOURS AS NEEDED FOR ITCHING 60 tablet 0   ketoconazole (NIZORAL) 2 % cream Apply 1 Application topically 2 (two) times daily. Apply between toes thin layer twice a day 60 g 2   losartan (COZAAR) 100 MG tablet Take 1 tablet (100 mg total) by mouth daily. 30 tablet 11   Multiple Vitamins-Minerals (MENS 50+ MULTIVITAMIN) TABS Take 1 tablet by mouth daily.     omeprazole (PRILOSEC) 40 MG capsule TAKE 1 CAPSULE BY MOUTH DAILY 100 capsule 2   rosuvastatin (CRESTOR) 40 MG tablet Take 1 tablet (40 mg total) by mouth daily. 90 tablet 2   senna-docusate (SENOKOT-S) 8.6-50 MG tablet Take 1 tablet by mouth 2 (two) times daily. While taking strong pain meds to prevent constipation 10 tablet 0   SPIRIVA RESPIMAT 2.5 MCG/ACT AERS USE 2 INHALATIONS BY MOUTH DAILY 12 g 3   triamcinolone ointment (KENALOG) 0.1 % APPLY TOPICALLY TO AFFECTED  AREA(S) TWICE DAILY AS NEEDED  FOR ITCHING 30 g 0   No current facility-administered medications for this visit.     Review of Systems  Please see the history of present illness.    (+) Back pain (+) Foot and ankle pain  All other systems reviewed and are otherwise negative except as noted above.  Physical Exam    Wt Readings from Last 3 Encounters:  09/18/22 206 lb 3.2 oz (93.5 kg)  08/22/22 206 lb 6.4 oz (93.6 kg)  08/07/22 208 lb 4.8 oz (94.5 kg)   ZO:XWRUE were no vitals filed for this visit.,There is no height or weight on file to calculate BMI.  Constitutional:      Appearance: Healthy appearance. Not in distress.  Neck:     Vascular: JVD normal.  Pulmonary:     Effort: Pulmonary effort is normal.     Breath sounds: No wheezing. No rales. Diminished in the bases Cardiovascular:     Normal rate. Regular rhythm. Normal S1. Normal S2.  Left bruit auscultated carotids    Murmurs: There is no murmur.   Edema:    Trace bilateral lower extremity edema. Abdominal:     Palpations: Abdomen is soft non tender. There is no hepatomegaly.  Skin:    General: Skin is warm and dry.  Neurological:     General: No focal deficit present.     Mental Status: Alert and oriented to person, place and time.     Cranial Nerves: Cranial nerves are intact.  EKG/LABS/ Recent Cardiac Studies    ECG personally reviewed by me today -none completed today   Lab Results  Component Value Date   WBC 7.5 03/22/2022   HGB 8.6 (L) 03/22/2022   HCT 26.2 (L) 03/22/2022   MCV 90 03/22/2022   PLT 407 03/22/2022   Lab Results  Component Value Date   CREATININE 2.01 (H) 07/03/2022   BUN 34 (H) 07/03/2022   NA 140 07/03/2022   K 4.4 07/03/2022   CL 102 07/03/2022   CO2 21 07/03/2022   Lab Results  Component Value Date   ALT 16 10/05/2022   AST 34 02/16/2022   ALKPHOS 56 02/16/2022   BILITOT 0.7 02/16/2022   Lab Results  Component Value Date   CHOL 174 10/05/2022   HDL 33 (L) 10/05/2022   LDLCALC 102 (H) 10/05/2022   TRIG 226 (H) 10/05/2022   CHOLHDL 5.3 (H) 10/05/2022    Lab Results  Component Value Date   HGBA1C 5.7 (A) 03/22/2022     Assessment & Plan    1.  HFpEF: -Patient is euvolemic on examination today with trace lower extremity edema present. -2D echo was completed 03/2022 with EF of 55 to 60% -Continue low-sodium heart healthy diet -Continue GDMT with Lasix 80 mg daily, carvedilol 12.5 mg, losartan 100 mg daily   2.  Peripheral artery disease: -Patient continues to have pain while ambulating which is associated with tobacco use. -We discussed the importance of tobacco cessation and managing his claudication type symptoms. -Patient advised to also try as needed Tylenol and diclofenac gel to manage claudication-like symptoms.  3.  Essential hypertension: -Patient's blood pressure was initially elevated at 143/68 and was 124/72 on recheck -Continue carvedilol 12.5 mg twice daily,  hydralazine 50 mg twice daily and losartan 100 mg daily  4.  Hyperlipidemia: -Patient had elevated LDL cholesterol at last check of 102 and was started on ezetimibe. -Continue Crestor 40 mg daily and ezetimibe 10 mg daily -We will plan to recheck lipids and LFTs in 8 weeks.  5.  Tobacco abuse: -Patient currently smoking 1 cigarette/day and is currently motivated to stop. -We will start nicotine patch 14 mg x 2 weeks and then 7 mg x 2 weeks. -Patient encouraged to contact us and 100 quit now for further guidance on smoking cessation.     Disposition: Follow-up with Christell Constant, MD or APP in 6 months    Medication Adjustments/Labs and Tests Ordered: Current medicines are reviewed at length with the patient today.  Concerns regarding medicines are outlined above.   Signed, Napoleon Form, Leodis Rains, NP 10/13/2022, 10:06 AM [facility]

## 2022-10-16 ENCOUNTER — Encounter: Payer: Self-pay | Admitting: Nurse Practitioner

## 2022-10-16 ENCOUNTER — Ambulatory Visit: Payer: 59 | Attending: Nurse Practitioner | Admitting: Nurse Practitioner

## 2022-10-16 VITALS — BP 143/68 | HR 70 | Ht 69.0 in | Wt 210.6 lb

## 2022-10-16 DIAGNOSIS — E785 Hyperlipidemia, unspecified: Secondary | ICD-10-CM

## 2022-10-16 DIAGNOSIS — R0989 Other specified symptoms and signs involving the circulatory and respiratory systems: Secondary | ICD-10-CM | POA: Diagnosis not present

## 2022-10-16 DIAGNOSIS — N1832 Chronic kidney disease, stage 3b: Secondary | ICD-10-CM

## 2022-10-16 DIAGNOSIS — I739 Peripheral vascular disease, unspecified: Secondary | ICD-10-CM

## 2022-10-16 DIAGNOSIS — I5032 Chronic diastolic (congestive) heart failure: Secondary | ICD-10-CM | POA: Diagnosis not present

## 2022-10-16 DIAGNOSIS — I10 Essential (primary) hypertension: Secondary | ICD-10-CM | POA: Diagnosis not present

## 2022-10-16 MED ORDER — NICOTINE 14 MG/24HR TD PT24: 14.0000 mg | MEDICATED_PATCH | Freq: Every day | TRANSDERMAL | 0 refills | Status: DC

## 2022-10-16 MED ORDER — NICOTINE 7 MG/24HR TD PT24: 7.0000 mg | MEDICATED_PATCH | Freq: Every day | TRANSDERMAL | 0 refills | Status: DC

## 2022-10-16 NOTE — Patient Instructions (Addendum)
Medication Instructions:   MAKE SURE YOU START TAKING : ZETIA 10 MG   FOR TWO WEEKS ONLY : NICODERM 14 MG /24 HR  MG PATCH    THEN   FOR TWO WEEKS ONLY : NICODERM 7 MG/24  HR PATCH   REQUEST REFILL IF PATCH  HAS  NOT DECREASED URGE FOR SMOKING YET.  ALSO CALL 1-800-QUIT NOW ( [phone])   *If you need a refill on your cardiac medications before your next appointment, please call your pharmacy*   Lab Work: RETURN IN 8 WEEKS FASTING FOR LIVER AND CHOLESTEROL    If you have labs (blood work) drawn today and your tests are completely normal, you will receive your results only by: MyChart Message (if you have MyChart) OR A paper copy in the mail If you have any lab test that is abnormal or we need to change your treatment, we will call you to review the results.   Testing/Procedures: Your physician has requested that you have a carotid duplex. This test is an ultrasound of the carotid arteries in your neck. It looks at blood flow through these arteries that supply the brain with blood. Allow one hour for this exam. There are no restrictions or special instructions.    Follow-Up: At [facility], you and your health needs are our priority.  As part of our continuing mission to provide you with exceptional heart care, we have created designated Provider Care Teams.  These Care Teams include your primary Cardiologist (physician) and Advanced Practice Providers (APPs -  Physician Assistants and Nurse Practitioners) who all work together to provide you with the care you need, when you need it.  We recommend signing up for the patient portal called "MyChart".  Sign up information is provided on this After Visit Summary.  MyChart is used to connect with patients for Virtual Visits (Telemedicine).  Patients are able to view lab/test results, encounter notes, upcoming appointments, etc.  Non-urgent messages can be sent to your provider as well.   To learn more about what you can do  with MyChart, go to [email or web address].    Your next appointment:    6 month(s)  Provider:    Christell Constant, MD OR ERNEST D. PA-C    Other Instructions

## 2022-10-25 DIAGNOSIS — N1832 Chronic kidney disease, stage 3b: Secondary | ICD-10-CM | POA: Diagnosis not present

## 2022-10-25 DIAGNOSIS — E785 Hyperlipidemia, unspecified: Secondary | ICD-10-CM | POA: Diagnosis not present

## 2022-10-25 DIAGNOSIS — I129 Hypertensive chronic kidney disease with stage 1 through stage 4 chronic kidney disease, or unspecified chronic kidney disease: Secondary | ICD-10-CM | POA: Diagnosis not present

## 2022-10-25 DIAGNOSIS — R7303 Prediabetes: Secondary | ICD-10-CM | POA: Diagnosis not present

## 2022-10-25 DIAGNOSIS — I5030 Unspecified diastolic (congestive) heart failure: Secondary | ICD-10-CM | POA: Diagnosis not present

## 2022-10-26 ENCOUNTER — Ambulatory Visit (HOSPITAL_COMMUNITY)
Admission: RE | Admit: 2022-10-26 | Discharge: 2022-10-26 | Disposition: A | Discharge status: Home / Self Care | Payer: 59 | Source: Ambulatory Visit | Attending: Nurse Practitioner | Admitting: Nurse Practitioner

## 2022-10-26 DIAGNOSIS — R0989 Other specified symptoms and signs involving the circulatory and respiratory systems: Secondary | ICD-10-CM

## 2022-10-31 ENCOUNTER — Other Ambulatory Visit: Payer: Self-pay | Admitting: Internal Medicine

## 2022-10-31 MED ORDER — GABAPENTIN 300 MG PO CAPS: 300.0000 mg | ORAL_CAPSULE | Freq: Two times a day (BID) | ORAL | 2 refills | Status: DC

## 2022-11-07 ENCOUNTER — Encounter: Payer: Self-pay | Admitting: Podiatry

## 2022-11-07 ENCOUNTER — Ambulatory Visit (INDEPENDENT_AMBULATORY_CARE_PROVIDER_SITE_OTHER): Payer: 59 | Admitting: Podiatry

## 2022-11-07 DIAGNOSIS — M79674 Pain in right toe(s): Secondary | ICD-10-CM

## 2022-11-07 DIAGNOSIS — I739 Peripheral vascular disease, unspecified: Secondary | ICD-10-CM

## 2022-11-07 DIAGNOSIS — B351 Tinea unguium: Secondary | ICD-10-CM

## 2022-11-07 DIAGNOSIS — E1142 Type 2 diabetes mellitus with diabetic polyneuropathy: Secondary | ICD-10-CM

## 2022-11-07 DIAGNOSIS — B353 Tinea pedis: Secondary | ICD-10-CM

## 2022-11-07 DIAGNOSIS — M79675 Pain in left toe(s): Secondary | ICD-10-CM

## 2022-11-07 MED ORDER — KETOCONAZOLE 2 % EX CREA
TOPICAL_CREAM | CUTANEOUS | 1 refills | Status: DC
Start: 2022-11-07 — End: 2023-12-13

## 2022-11-07 NOTE — Progress Notes (Signed)
  Subjective:  Patient ID: Seth Santana, male    DOB: 1950-12-20,  MRN: [id number]  FIELDEN DEREMER presents to clinic today for at risk foot care with history of diabetic neuropathy and callus(es) right foot and painful thick toenails that are difficult to trim. Painful toenails interfere with ambulation. Aggravating factors include wearing enclosed shoe gear. Pain is relieved with periodic professional debridement. Painful calluses are aggravated when weightbearing with and without shoegear. Pain is relieved with periodic professional debridement.   Has been seen by Vascular and PAD confirmed.  Patient is requesting refill of Ketoconazole Cream today.  Chief Complaint  Patient presents with   RFC    RFC ABN SIGNED   New problem(s): None.   PCP is Atway, Rayann N, DO.  No Known Allergies  Review of Systems: Negative except as noted in the HPI.  Objective: No changes noted in today's physical examination. There were no vitals filed for this visit. Seth Santana is a pleasant 72 y.o. male WD, WN in NAD. AAO x 3.  Vascular Examination: CFT <3 seconds b/l. DP/PT pulses faintly palpable b/l. Skin temperature gradient warm to warm b/l. No pain with calf compression. Pedal hair sparse. Nonpitting edema noted b/l lower extremities. Evidence of chronic venous insufficiency b/l LE.No ischemia or gangrene. No cyanosis or clubbing noted b/l.    Neurological Examination: Pt has subjective symptoms of neuropathy. Proprioception intact bilaterally. Sensation grossly intact b/l with 10 gram monofilament. Vibratory sensation intact b/l.   Dermatological Examination: Pedal skin warm and supple b/l. Toenails 1-5 b/l thick, discolored, elongated with subungual debris and pain on dorsal palpation.  No open wounds. No interdigital maceration.   Resolved hyperkeratotic lesion(s) medial IPJ of right great toe.  No erythema, no edema, no drainage, no fluctuance.  Mild scaling noted periphery of  both feet, but improved from prior visit.  Musculoskeletal Examination: Muscle strength 5/5 to b/l LE. No pain, crepitus or joint limitation noted with ROM bilateral LE. No gross bony deformities bilaterally.  Radiographs: None  Assessment/Plan: 1. Pain due to onychomycosis of toenails of both feet   2. Tinea pedis of both feet   3. PAD (peripheral artery disease) (HCC)   4. Diabetic peripheral neuropathy associated with type 2 diabetes mellitus (HCC)     -Consent given for treatment as described below: -Examined patient. -Patient followed by Vascular Surgery for PAD. No wounds noted today. -Continue foot and shoe inspections daily. Monitor blood glucose per PCP/Endocrinologist's recommendations. -For tinea pedis, Rx sent for Ketoconazole Cream 2% to be applied bid for 6 weeks. -Mycotic toenails 1-5 bilaterally were debrided in length and girth with sterile nail nippers and dremel without incident. -Patient/POA to call should there be question/concern in the interim.   Return in about 3 months (around 02/07/2023).  Seth Santana, DPM

## 2022-12-13 ENCOUNTER — Ambulatory Visit: Payer: 59 | Attending: Nurse Practitioner

## 2022-12-13 DIAGNOSIS — E785 Hyperlipidemia, unspecified: Secondary | ICD-10-CM | POA: Diagnosis not present

## 2022-12-13 LAB — HEPATIC FUNCTION PANEL
ALT: 18 IU/L (ref 0–44)
AST: 23 IU/L (ref 0–40)
Albumin: 4.2 g/dL (ref 3.9–4.9)
Alkaline Phosphatase: 76 IU/L (ref 44–121)
Bilirubin Total: 0.3 mg/dL (ref 0.0–1.2)
Bilirubin, Direct: <0.1 mg/dL (ref 0.00–0.40)
Total Protein: 7.3 g/dL (ref 6.0–8.5)

## 2022-12-13 LAB — LIPID PANEL
Chol/HDL Ratio: 4.6 ratio (ref 0.0–5.0)
Cholesterol, Total: 133 mg/dL (ref 100–199)
HDL: 29 mg/dL — ABNORMAL LOW (ref >39)
LDL Chol Calc (NIH): 66 mg/dL (ref 0–99)
Triglycerides: 231 mg/dL — ABNORMAL HIGH (ref 0–149)
VLDL Cholesterol Cal: 38 mg/dL (ref 5–40)

## 2022-12-25 ENCOUNTER — Other Ambulatory Visit: Payer: Self-pay | Admitting: Internal Medicine

## 2023-01-15 ENCOUNTER — Ambulatory Visit: Payer: 59 | Attending: Internal Medicine

## 2023-01-15 DIAGNOSIS — I739 Peripheral vascular disease, unspecified: Secondary | ICD-10-CM

## 2023-01-15 DIAGNOSIS — I700 Atherosclerosis of aorta: Secondary | ICD-10-CM | POA: Diagnosis not present

## 2023-01-15 DIAGNOSIS — E785 Hyperlipidemia, unspecified: Secondary | ICD-10-CM | POA: Diagnosis not present

## 2023-01-16 LAB — LIPID PANEL
Chol/HDL Ratio: 3.6 {ratio} (ref 0.0–5.0)
Cholesterol, Total: 143 mg/dL (ref 100–199)
HDL: 40 mg/dL (ref >39)
LDL Chol Calc (NIH): 64 mg/dL (ref 0–99)
Triglycerides: 239 mg/dL — ABNORMAL HIGH (ref 0–149)
VLDL Cholesterol Cal: 39 mg/dL (ref 5–40)

## 2023-01-16 LAB — ALT: ALT: 17 [IU]/L (ref 0–44)

## 2023-01-24 ENCOUNTER — Encounter: Payer: Self-pay | Admitting: *Deleted

## 2023-01-24 DIAGNOSIS — Z006 Encounter for examination for normal comparison and control in clinical research program: Secondary | ICD-10-CM

## 2023-01-24 NOTE — Research (Signed)
Message left for Mr. Dirocco about Yvonne Kendall research. Encouraged him to call back if he would like more information.

## 2023-02-06 ENCOUNTER — Telehealth: Payer: Self-pay | Admitting: Internal Medicine

## 2023-02-06 DIAGNOSIS — E785 Hyperlipidemia, unspecified: Secondary | ICD-10-CM

## 2023-02-06 NOTE — Telephone Encounter (Signed)
Daughter is agreeable to Pharm D  OV.  Advised one of our schedulers will call to set up appointment. No further questions at this time.

## 2023-02-06 NOTE — Telephone Encounter (Signed)
Pt's daughter is requesting a callback from nurse regarding his results. Please advise

## 2023-02-06 NOTE — Telephone Encounter (Signed)
-----   Message from Christell Constant sent at 01/17/2023  2:05 PM EDT ----- LDL at goal; TGS elevated - smoking cessation encouraged.  Can offer Lipid clinic eval for elevated TGs ----- Message ----- From: Interface, [facility] Lab Results In Sent: 01/16/2023   4:36 AM EDT To: Christell Constant, MD

## 2023-02-14 ENCOUNTER — Ambulatory Visit (INDEPENDENT_AMBULATORY_CARE_PROVIDER_SITE_OTHER): Payer: 59 | Admitting: Podiatry

## 2023-02-14 ENCOUNTER — Encounter: Payer: Self-pay | Admitting: Podiatry

## 2023-02-14 DIAGNOSIS — M79675 Pain in left toe(s): Secondary | ICD-10-CM | POA: Diagnosis not present

## 2023-02-14 DIAGNOSIS — B351 Tinea unguium: Secondary | ICD-10-CM

## 2023-02-14 DIAGNOSIS — M79674 Pain in right toe(s): Secondary | ICD-10-CM

## 2023-02-14 DIAGNOSIS — I739 Peripheral vascular disease, unspecified: Secondary | ICD-10-CM

## 2023-02-14 DIAGNOSIS — E1142 Type 2 diabetes mellitus with diabetic polyneuropathy: Secondary | ICD-10-CM

## 2023-02-15 ENCOUNTER — Ambulatory Visit: Payer: 59 | Attending: Cardiovascular Disease | Admitting: Pharmacist

## 2023-02-15 DIAGNOSIS — E785 Hyperlipidemia, unspecified: Secondary | ICD-10-CM | POA: Insufficient documentation

## 2023-02-15 DIAGNOSIS — E782 Mixed hyperlipidemia: Secondary | ICD-10-CM

## 2023-02-15 DIAGNOSIS — I739 Peripheral vascular disease, unspecified: Secondary | ICD-10-CM | POA: Diagnosis not present

## 2023-02-15 MED ORDER — ICOSAPENT ETHYL 1 G PO CAPS
2.0000 g | ORAL_CAPSULE | Freq: Two times a day (BID) | ORAL | 3 refills | Status: DC
Start: 2023-02-15 — End: 2023-11-23

## 2023-02-15 NOTE — Progress Notes (Signed)
Patient ID: Seth Santana                 DOB: 1951/02/09                    MRN: [id number]      HPI: Seth Santana is a 72 y.o. male patient referred to lipid clinic by Dr. Izora Ribas. PMH is significant for HFpEF (Echo 03/2022 with EF 55-60%), HTN, CKDIIIb, PAD, headache, BPH, prediabetes (A1c 5.7% in 2023).   Last seen by NP Robin Searing on 10/16/22 after initial visit on 07/04/22 with Dr. Izora Ribas for CP. Nuclear med stress test w/o evidence of ischemia. ABIs wnl, but duplex demonstrated 30-49% stenosis in superficial femoral artery bilaterally. Started on ASA81. In July, chest discomfort reduced to a few episodes that last a second or 2. Endorsed pain with ambulation, only when smoking (down to 1 cigarette/day from 1 pack/week. For elevated LDL-C, pt was started on ezetimibe 10 mg PO daily, continued rosuvastatin 40 mg PO daily. On recheck 01/15/23, LDL-C decreased to 64 mg/dL. TG have been stable and elevated to 220-230s on previous 3 lipid panels. Referred to lipid clinic for elevated TG.   Today patient presents with his daughter, Seth Santana. Pt is has hearing impairment. Reports doing ok. Has ongoing pain in his leg. Denies issues with any of his other medications. Pt is tolerating ezetimibe well. Pt reports drinking cranberry juice throughout the day, daughter states that he also eats a lot of candy. Alcohol intake is down to one drink ~ every other day. Daughter states that she is unsure whether he would be willing to reduce sugar intake, or cut cranberry juice with water. When discussing options to optimize cholesterol including PCKS9i and icosapent ethyl, daughter was concerned that they would forget doses of every other week PCSK9i. Currently use BID pill box for all his maintenance medications.   Pt has Medicare/Medicaid Dual Complete insurance - does not typically pay anything for his medications.   Current Medications: rosuvastatin 40 mg PO daily, ezetimibe 10 mg PO  daily Intolerances: none Risk Factors: PAD, CKDIIIb, HTN, HFpEF, smoking LDL-C goal: <55 mg/dL ApoB goal: <29  Diet: Pt and daughter report intake of sugary foods and drinks including cranberry juice multiple times/day, candy, and sprite about once per week. Reports that he has decreased his intake of processed meats since he started living with her, eating more whole foods.   Exercise: Limited due to back pain, foot and ankle disomfort  Family History: DM and kidney disease in mother.   Social History: Snoking 1 cigarette/day down from 1 cigarette/week. Alcohol use including 1 drink ~ every other day. Unknown drug use - hx of marijuana use.   Labs: Lipid Panel     Component Value Date/Time   CHOL 143 01/15/2023 0854   TRIG 239 (H) 01/15/2023 0854   HDL 40 01/15/2023 0854   CHOLHDL 3.6 01/15/2023 0854   CHOLHDL 6.1 05/23/2013 1613   VLDL 78 (H) 05/23/2013 1613   LDLCALC 64 01/15/2023 0854   LABVLDL 39 01/15/2023 0854    Past Medical History:  Diagnosis Date   Blood in stool last few years   BPH (benign prostatic hyperplasia)    Closed fracture of tripod (HCC) 02/18/2013   Hard of hearing    Headache(784.0)    Hypertension     Current Outpatient Medications on File Prior to Visit  Medication Sig Dispense Refill   acetaminophen (TYLENOL) 500 MG tablet Take 500 mg by  mouth every 6 (six) hours as needed for moderate pain.     aspirin EC 81 MG tablet Take 1 tablet (81 mg total) by mouth daily. Swallow whole. 90 tablet 3   carvedilol (COREG) 12.5 MG tablet TAKE 1 TABLET BY MOUTH TWICE  DAILY WITH MEALS 200 tablet 2   empagliflozin (JARDIANCE) 10 MG TABS tablet TAKE 1 TABLET BY MOUTH DAILY  BEFORE BREAKFAST 100 tablet 3   ezetimibe (ZETIA) 10 MG tablet Take 1 tablet (10 mg total) by mouth daily. 90 tablet 3   finasteride (PROSCAR) 5 MG tablet TAKE 1 TABLET BY MOUTH DAILY 100 tablet 2   furosemide (LASIX) 80 MG tablet Take 1 tablet (80 mg total) by mouth daily. morning 90 tablet  3   gabapentin (NEURONTIN) 300 MG capsule TAKE 1 CAPSULE BY MOUTH TWICE  DAILY 180 capsule 3   hydrALAZINE (APRESOLINE) 50 MG tablet TAKE 1 TABLET BY MOUTH IN THE  MORNING AND AT BEDTIME 60 tablet 11   losartan (COZAAR) 100 MG tablet Take 1 tablet (100 mg total) by mouth daily. 30 tablet 11   omeprazole (PRILOSEC) 40 MG capsule TAKE 1 CAPSULE BY MOUTH DAILY 100 capsule 2   rosuvastatin (CRESTOR) 40 MG tablet Take 1 tablet (40 mg total) by mouth daily. 90 tablet 2   SPIRIVA RESPIMAT 2.5 MCG/ACT AERS USE 2 INHALATIONS BY MOUTH DAILY 12 g 3   augmented betamethasone dipropionate (DIPROLENE-AF) 0.05 % cream as needed (dry skin).     Cyanocobalamin (B-12 PO) Take 1 tablet by mouth daily as needed (energy).     ferrous sulfate 325 (65 FE) MG EC tablet Take 1 tablet (325 mg total) by mouth 2 (two) times daily. 180 tablet 1   FIBER PO Take 1 tablet by mouth daily as needed (constipation).     hydrOXYzine (ATARAX) 10 MG tablet TAKE 1 TABLET BY MOUTH EVERY 8  HOURS AS NEEDED FOR ITCHING 60 tablet 0   ketoconazole (NIZORAL) 2 % cream Apply 1 Application topically 2 (two) times daily. Apply between toes thin layer twice a day 60 g 2   ketoconazole (NIZORAL) 2 % cream Apply to both feet and between toes once daily for 6 weeks. 60 g 1   Multiple Vitamins-Minerals (MENS 50+ MULTIVITAMIN) TABS Take 1 tablet by mouth daily.     nicotine (NICODERM CQ) 14 mg/24hr patch Place 1 patch (14 mg total) onto the skin daily. 14 patch 0   nicotine (NICODERM CQ) 7 mg/24hr patch Place 1 patch (7 mg total) onto the skin daily. 14 patch 0   senna-docusate (SENOKOT-S) 8.6-50 MG tablet Take 1 tablet by mouth 2 (two) times daily. While taking strong pain meds to prevent constipation 10 tablet 0   triamcinolone ointment (KENALOG) 0.1 % APPLY TOPICALLY TO AFFECTED  AREA(S) TWICE DAILY AS NEEDED  FOR ITCHING 30 g 0   No current facility-administered medications on file prior to visit.    No Known  Allergies  Assessment/Plan:  1. Hyperlipidemia -  Hyperlipidemia Assessment - LDL-C of 64 mg/dL much improved since starting ezetimibe, though still slightly uncontrolled above goal <55 mg/dL given PAD and 2 high risk conditions (HTN, CKDIIIb).  - Referred to lipid clinic for hypertriglyceridemia, TG uncontrolled above goal <150 mg/dL, likely exacerbated by dietary excursions with daily intake of processed sugars and alcohol intake every other day - To optimize regimen, pt would benefit from initiation of PCSK9i and Vascepa (icosapent ethyl) to achieve LDL-C goal, lower TG, and reduce risk of CV  event. However, pt and daughter concerned about adherence to PCSK9i administered every 2 weeks. Preferred initiation of oral medication today.  - Discussed impact of diet, including intake of processed sugars, saturated fats, and alcohol to help reduce triglycerides.   Plan - Start Vascepa (icosapent ethyl) 2 g PO BID - No PA required. Sent Rx to mail order pharmacy as requested.  - Encouraged lifestyle interventions listed above to help reduce TG levels - Recommend repeat lipid panel 8-12 weeks after starting Vascepa   Thank you,  Nils Pyle, PharmD PGY1 Pharmacy Resident  Olene Floss, Pharm.D, BCACP, BCPS, CPP [facility] of [facility] [address] [place], [place] [address]  Phone: [phone]; Fax: [phone]

## 2023-02-15 NOTE — Assessment & Plan Note (Addendum)
Assessment - LDL-C of 64 mg/dL much improved since starting ezetimibe, though still slightly uncontrolled above goal <55 mg/dL given PAD and 2 high risk conditions (HTN, CKDIIIb).  - Referred to lipid clinic for hypertriglyceridemia, TG uncontrolled above goal <150 mg/dL, likely exacerbated by dietary excursions with daily intake of processed sugars and alcohol intake every other day - To optimize regimen, pt would benefit from initiation of PCSK9i and Vascepa (icosapent ethyl) to achieve LDL-C goal, lower TG, and reduce risk of CV event. However, pt and daughter concerned about adherence to PCSK9i administered every 2 weeks. Preferred initiation of oral medication today.  - Discussed impact of diet, including intake of processed sugars, saturated fats, and alcohol to help reduce triglycerides.   Plan - Start Vascepa (icosapent ethyl) 2 g PO BID - No PA required. Sent Rx to mail order pharmacy as requested.  - Encouraged lifestyle interventions listed above to help reduce TG levels - Recommend repeat lipid panel 8-12 weeks after starting Vascepa

## 2023-02-15 NOTE — Patient Instructions (Signed)
It was great to meet you today.   Please start Vascepa (icosapent ethyl) 2 capsules (2g total) by mouth twice daily.  After taking the medication for 2-3 months, please come in to have your lipid panel checked again.   Schedule cardiology follow-up for sometime in January.

## 2023-02-16 NOTE — Progress Notes (Signed)
  Subjective:  Patient ID: Seth Santana, male    DOB: 04-05-1951,  MRN: [id number]  72 y.o. male presents with at risk foot care with history of diabetic neuropathy and painful, discolored, thick toenails which interfere with daily activities Chief Complaint  Patient presents with   RFC    RFC     PCP: Chauncey Mann, DO.  New problem(s): None.   Review of Systems: Negative except as noted in the HPI.   No Known Allergies  Objective:  There were no vitals filed for this visit. Constitutional Patient is a pleasant 72 y.o. African American male WD, WN in NAD. AAO x 3.  Vascular Capillary fill time to digits <3 seconds.  DP/PT pulse(s) are faintly palpable b/l lower extremities. Pedal hair absent b/l. Lower extremity skin temperature gradient warm to cool b/l. No pain with calf compression b/l. No cyanosis or clubbing noted. No ischemia nor gangrene noted b/l.   Neurologic Protective sensation intact 5/5 intact bilaterally with 10g monofilament b/l. Vibratory sensation intact b/l. No clonus b/l. Pt has subjective symptoms of neuropathy.  Dermatologic Pedal skin is thin, shiny and atrophic b/l.  No open wounds b/l lower extremities. No interdigital macerations b/l lower extremities. Toenails 1-5 b/l elongated, discolored, dystrophic, thickened, crumbly with subungual debris and tenderness to dorsal palpation.   Orthopedic: Normal muscle strength 5/5 to all lower extremity muscle groups bilaterally. No pain, crepitus or joint limitation noted with ROM bilateral LE. No gross bony deformities bilaterally.   Last HgA1c:     Latest Ref Rng & Units 03/22/2022    3:16 PM  Hemoglobin A1C  Hemoglobin-A1c 4.0 - 5.6 % 5.7      Assessment:   1. Pain due to onychomycosis of toenails of both feet   2. PAD (peripheral artery disease) (HCC)   3. Diabetic peripheral neuropathy associated with type 2 diabetes mellitus (HCC)    Plan:  Consent given for treatment as described below: -Patient  was evaluated today. All questions/concerns addressed on today's visit. -Continue foot and shoe inspections daily. Monitor blood glucose per PCP/Endocrinologist's recommendations. -Patient to continue soft, supportive shoe gear daily. -Toenails 1-5 b/l were debrided in length and girth with sterile nail nippers and dremel without iatrogenic bleeding.  -Patient/POA to call should there be question/concern in the interim.  Return in about 3 months (around 05/17/2023).  Freddie Breech, DPM

## 2023-02-21 DIAGNOSIS — N1832 Chronic kidney disease, stage 3b: Secondary | ICD-10-CM | POA: Diagnosis not present

## 2023-02-21 DIAGNOSIS — D631 Anemia in chronic kidney disease: Secondary | ICD-10-CM | POA: Diagnosis not present

## 2023-02-21 DIAGNOSIS — N189 Chronic kidney disease, unspecified: Secondary | ICD-10-CM | POA: Diagnosis not present

## 2023-02-21 DIAGNOSIS — G629 Polyneuropathy, unspecified: Secondary | ICD-10-CM | POA: Diagnosis not present

## 2023-02-21 DIAGNOSIS — I129 Hypertensive chronic kidney disease with stage 1 through stage 4 chronic kidney disease, or unspecified chronic kidney disease: Secondary | ICD-10-CM | POA: Diagnosis not present

## 2023-02-21 DIAGNOSIS — I5030 Unspecified diastolic (congestive) heart failure: Secondary | ICD-10-CM | POA: Diagnosis not present

## 2023-02-21 DIAGNOSIS — E785 Hyperlipidemia, unspecified: Secondary | ICD-10-CM | POA: Diagnosis not present

## 2023-02-26 ENCOUNTER — Other Ambulatory Visit: Payer: Self-pay

## 2023-02-26 MED ORDER — ROSUVASTATIN CALCIUM 40 MG PO TABS: 40.0000 mg | ORAL_TABLET | Freq: Every day | ORAL | 2 refills | Status: DC

## 2023-03-15 ENCOUNTER — Encounter: Payer: Self-pay | Admitting: Neurology

## 2023-03-21 DIAGNOSIS — R0602 Shortness of breath: Secondary | ICD-10-CM | POA: Diagnosis not present

## 2023-03-21 DIAGNOSIS — Z1211 Encounter for screening for malignant neoplasm of colon: Secondary | ICD-10-CM | POA: Diagnosis not present

## 2023-03-21 DIAGNOSIS — I10 Essential (primary) hypertension: Secondary | ICD-10-CM | POA: Diagnosis not present

## 2023-03-22 ENCOUNTER — Other Ambulatory Visit: Payer: Self-pay | Admitting: Internal Medicine

## 2023-03-30 ENCOUNTER — Other Ambulatory Visit: Payer: Self-pay | Admitting: Internal Medicine

## 2023-03-30 DIAGNOSIS — I10 Essential (primary) hypertension: Secondary | ICD-10-CM

## 2023-04-15 NOTE — Progress Notes (Signed)
 Cardiology Office Note    Patient Name: Seth Santana Date of Encounter: 04/15/2023  Primary Care Provider:  Jimmy Anna SAILOR, DO Primary Cardiologist:  Stanly DELENA Leavens, MD Primary Electrophysiologist: None   Past Medical History    Past Medical History:  Diagnosis Date   Blood in stool last few years   BPH (benign prostatic hyperplasia)    Closed fracture of tripod (HCC) 02/18/2013   Hard of hearing    Headache(784.0)    Hypertension     History of Present Illness  ERSEL Santana  is a 73 year old male with a PMH of HFpEF, HTN, CKD stage IIIb, PAD, BPH, prediabetes who presents today for 65-month follow-up.  Seth Santana was last seen on 10/16/2022 and reported improvement to chest discomfort at that time.  He was continuing to smoke 1 cigarette a day which was down from 1 pack a week.  He had also reported decreased EtOH abuse.  Started on nicotine  patch due to current motivation to stop smoking.  He had lipids checked and was noted to have elevated TGs and was referred to lipid clinic for further evaluation.    He was seen by the Pharm.D. on 02/15/2023 and was advised to begin a PCSK9 however patient and daughter were concerned about adhering to every other week regimen.  He was also advised to reduce consumption of processed foods and sugary foods.  He was started on Vascepa  2 g p.o. twice daily with repeat lipids completed in 8 to 12 weeks with little improvement to triglycerides.   Seth Santana presents today with his daughter for 35-month follow-up.   During today's visit Seth Santana presents with knots on his legs that have been present for about a month and a half to two months. The knots sometimes cause pain, particularly when the patient stretches his legs or walks. The patient's activity level has decreased, and he has a preference for salty foods. The patient's caregiver reports that the patient has been less active and has a preference for salty foods. The patient is  due for a colonoscopy and has not taken his usual medications on the day of the appointment.his initial blood pressure was elevated at 154/70 and was 138/84 on recheck.  He was advised to monitor his blood pressure especially today and lieu of not taking BP medications.  We discussed his lipid results and triglycerides being elevated patient reports that he did not start Vascepa  due to high cost.  When discussing further with his daughter she would like to know about alternatives and possibly starting PCSK9 therapy.  Patient denies chest pain, palpitations, dyspnea, PND, orthopnea, nausea, vomiting, dizziness, syncope, edema, weight gain, or early satiety.  Review of Systems  Please see the history of present illness.    All other systems reviewed and are otherwise negative except as noted above.  Physical Exam    Wt Readings from Last 3 Encounters:  10/16/22 210 lb 9.6 oz (95.5 kg)  09/18/22 206 lb 3.2 oz (93.5 kg)  08/22/22 206 lb 6.4 oz (93.6 kg)   CD:Uyzmz were no vitals filed for this visit.,There is no height or weight on file to calculate BMI. GEN: Well nourished, well developed in no acute distress Neck: No JVD; No carotid bruits Pulmonary: Clear to auscultation without rales, wheezing or rhonchi  Cardiovascular: Normal rate. Regular rhythm. Normal S1. Normal S2.   Murmurs: There is no murmur.  ABDOMEN: Soft, non-tender, non-distended EXTREMITIES:  No edema; No deformity  EKG/LABS/ Recent Cardiac Studies   ECG personally reviewed by me today -none completed today  Risk Assessment/Calculations:          Lab Results  Component Value Date   WBC 7.5 03/22/2022   HGB 8.6 (L) 03/22/2022   HCT 26.2 (L) 03/22/2022   MCV 90 03/22/2022   PLT 407 03/22/2022   Lab Results  Component Value Date   CREATININE 2.01 (H) 07/03/2022   BUN 34 (H) 07/03/2022   NA 140 07/03/2022   K 4.4 07/03/2022   CL 102 07/03/2022   CO2 21 07/03/2022   Lab Results  Component Value Date   CHOL  143 01/15/2023   HDL 40 01/15/2023   LDLCALC 64 01/15/2023   TRIG 239 (H) 01/15/2023   CHOLHDL 3.6 01/15/2023    Lab Results  Component Value Date   HGBA1C 5.7 (A) 03/22/2022   Assessment & Plan    1.HFpEF: -Patient is euvolemic on examination and is NYHA class II -Lasix  80 mg, carvedilol  12.5 mg twice daily and losartan  100 mg daily, Hydralazine   50 mg daily -Low sodium diet, fluid restriction <2L, and daily weights encouraged. Educated to contact our office for weight gain of 2 lbs overnight or 5 lbs in one week.   2.  Essential hypertension: -Patient's blood pressure was initially 154/70 and was 138/84 on recheck. -He is currently holding BP medications due to colonoscopy scheduled for tomorrow. -Asked patient to monitor blood pressures as soon as safely possible tomorrow.  3.  Mixed HLD: -Elevated triglycerides despite current medication regimen. Discussed the potential addition of Vascepa , but cost is a concern. Patient prefers oral medication over injectable. -Consult with pharmacist regarding alternative medication options. -Consider starting PCSK9 inhibitor if no affordable oral alternatives are available  4.  Tobacco abuse: -Continued smoking despite nicotine  patch use. Discussed the benefits of smoking cessation and provided resources for support. -Encouraged to call 1-800-QUIT-NOW for additional support and resources  5.  Lower extremity pain: -Please see #6 for recommendations and follow-up  6.  PAD: -Reports of new knots on legs and pain with dorsiflexion. No signs of acute inflammation or venous thrombosis on examination. -Order D-dimer blood test to rule out deep vein thrombosis (DVT). -Order bilateral lower extremity ultrasound to further evaluate leg symptoms. -Encourage continuation of walking program for symptom management and overall health improvement     Disposition: Follow-up with Stanly DELENA Leavens, MD or APP in 6 months    Signed, Wyn Raddle,  Jackee Shove, NP 04/15/2023, 11:32 AM [facility]

## 2023-04-16 ENCOUNTER — Encounter: Payer: Self-pay | Admitting: Nurse Practitioner

## 2023-04-16 ENCOUNTER — Ambulatory Visit (HOSPITAL_COMMUNITY)
Admission: RE | Admit: 2023-04-16 | Discharge: 2023-04-16 | Disposition: A | Discharge status: Home / Self Care | Payer: 59 | Source: Ambulatory Visit | Attending: Cardiology | Admitting: Cardiology

## 2023-04-16 ENCOUNTER — Ambulatory Visit: Payer: 59 | Attending: Nurse Practitioner | Admitting: Nurse Practitioner

## 2023-04-16 VITALS — BP 138/84 | HR 54 | Ht 69.0 in | Wt 213.0 lb

## 2023-04-16 DIAGNOSIS — E785 Hyperlipidemia, unspecified: Secondary | ICD-10-CM | POA: Insufficient documentation

## 2023-04-16 DIAGNOSIS — I739 Peripheral vascular disease, unspecified: Secondary | ICD-10-CM | POA: Diagnosis not present

## 2023-04-16 DIAGNOSIS — I5032 Chronic diastolic (congestive) heart failure: Secondary | ICD-10-CM | POA: Diagnosis not present

## 2023-04-16 DIAGNOSIS — M79604 Pain in right leg: Secondary | ICD-10-CM

## 2023-04-16 DIAGNOSIS — M79605 Pain in left leg: Secondary | ICD-10-CM | POA: Insufficient documentation

## 2023-04-16 DIAGNOSIS — I10 Essential (primary) hypertension: Secondary | ICD-10-CM | POA: Insufficient documentation

## 2023-04-16 DIAGNOSIS — Z720 Tobacco use: Secondary | ICD-10-CM | POA: Insufficient documentation

## 2023-04-16 NOTE — Patient Instructions (Addendum)
 Medication Instructions:  Your physician recommends that you continue on your current medications as directed. Please refer to the Current Medication list given to you today.  *If you need a refill on your cardiac medications before your next appointment, please call your pharmacy*   Lab Work: TODAY:  DDIMER  If you have labs (blood work) drawn today and your tests are completely normal, you will receive your results only by: MyChart Message (if you have MyChart) OR A paper copy in the mail If you have any lab test that is abnormal or we need to change your treatment, we will call you to review the results.   Testing/Procedures: Your physician has requested that you have a lower extremity venous duplex ASAP. This test is an ultrasound of the veins in the legs or arms. It looks at venous blood flow that carries blood from the heart to the legs or arms. Allow one hour for a Lower Venous exam. Allow thirty minutes for an Upper Venous exam. There are no restrictions or special instructions.  Please note: We ask at that you not bring children with you during ultrasound (echo/ vascular) testing. Due to room size and safety concerns, children are not allowed in the ultrasound rooms during exams. Our front office staff cannot provide observation of children in our lobby area while testing is being conducted. An adult accompanying a patient to their appointment will only be allowed in the ultrasound room at the discretion of the ultrasound technician under special circumstances. We apologize for any inconvenience.    Follow-Up: At [facility], you and your health needs are our priority.  As part of our continuing mission to provide you with exceptional heart care, we have created designated Provider Care Teams.  These Care Teams include your primary Cardiologist (physician) and Advanced Practice Providers (APPs -  Physician Assistants and Nurse Practitioners) who all work together to provide  you with the care you need, when you need it.  We recommend signing up for the patient portal called MyChart.  Sign up information is provided on this After Visit Summary.  MyChart is used to connect with patients for Virtual Visits (Telemedicine).  Patients are able to view lab/test results, encounter notes, upcoming appointments, etc.  Non-urgent messages can be sent to your provider as well.   To learn more about what you can do with MyChart, go to [email or web address].    Your next appointment:   6 month(s)  Provider:   Stanly DELENA Leavens, MD  or Jackee Alberts, NP         Other Instructions

## 2023-04-17 DIAGNOSIS — K5140 Inflammatory polyps of colon without complications: Secondary | ICD-10-CM | POA: Diagnosis not present

## 2023-04-17 DIAGNOSIS — Z860100 Personal history of colon polyps, unspecified: Secondary | ICD-10-CM | POA: Diagnosis not present

## 2023-04-17 DIAGNOSIS — D125 Benign neoplasm of sigmoid colon: Secondary | ICD-10-CM | POA: Diagnosis not present

## 2023-04-17 DIAGNOSIS — Z860101 Personal history of adenomatous and serrated colon polyps: Secondary | ICD-10-CM | POA: Diagnosis not present

## 2023-04-17 DIAGNOSIS — D120 Benign neoplasm of cecum: Secondary | ICD-10-CM | POA: Diagnosis not present

## 2023-04-17 DIAGNOSIS — K635 Polyp of colon: Secondary | ICD-10-CM | POA: Diagnosis not present

## 2023-04-17 LAB — D-DIMER, QUANTITATIVE: D-DIMER: 0.49 mg{FEU}/L (ref 0.00–0.49)

## 2023-04-25 ENCOUNTER — Other Ambulatory Visit: Payer: Self-pay | Admitting: Internal Medicine

## 2023-04-27 NOTE — Progress Notes (Deleted)
[facility] Neurology [facility] Clinic Note - Initial Visit   Date: 05/01/2023   Seth Santana MRN: [id number] DOB: 01/20/1951   Dear Dr Ned Card, Derwood Kaplan, DO:  Thank you for your kind referral of Seth Santana for consultation of ***. Although his history is well known to you, please allow [place] to reiterate it for the purpose of our medical record. The patient was accompanied to the clinic by *** who also provides collateral information.     Seth Santana is a 73 y.o. ***-handed male with hypertension, hyperlipidemia, BPH, diabetes, HFpEF, CKD, s/p R radical orchiectomy, *** presenting for evaluation of neuropathy.   IMPRESSION/PLAN: ****  Return to clinic in ***  ------------------------------------------------------------- History of present illness: ***  Bilateral leg numbness/tingling.  Smokes ***.  Drink *** beers/daily.   Out-side paper records, electronic medical record, and images have been reviewed where available and summarized as: *** Lab Results  Component Value Date   HGBA1C 5.7 (A) 03/22/2022    Labs 02/21/2023:  Vitamin B12 1892, folate >20   Past Medical History:  Diagnosis Date   Blood in stool last few years   BPH (benign prostatic hyperplasia)    Closed fracture of tripod (HCC) 02/18/2013   Hard of hearing    Headache(784.0)    Hypertension     Past Surgical History:  Procedure Laterality Date   COLONOSCOPY WITH PROPOFOL N/A 10/09/2013   Procedure: COLONOSCOPY WITH PROPOFOL;  Surgeon: Charolett Bumpers, MD;  Location: [facility];  Service: Endoscopy;  Laterality: N/A;   NO PAST SURGERIES     ORCHIECTOMY Right 03/15/2022   Procedure: INGUINAL RADICAL ORCHIECTOMY;  Surgeon: Sebastian Ache, MD;  Location: [facility];  Service: Urology;  Laterality: Right;     Medications:  Outpatient Encounter Medications as of 05/01/2023  Medication Sig Note   acetaminophen (TYLENOL) 500 MG tablet Take 500 mg by mouth every 6 (six) hours as  needed for moderate pain.    aspirin EC 81 MG tablet Take 1 tablet (81 mg total) by mouth daily. Swallow whole.    augmented betamethasone dipropionate (DIPROLENE-AF) 0.05 % cream as needed (dry skin).    carvedilol (COREG) 12.5 MG tablet TAKE 1 TABLET BY MOUTH TWICE  DAILY WITH MEALS    CLENPIQ 10-3.5-12 MG-GM -GM/175ML SOLN 175 mL orally twice for 2    Cyanocobalamin (B-12 PO) Take 1 tablet by mouth daily as needed (energy).    empagliflozin (JARDIANCE) 10 MG TABS tablet TAKE 1 TABLET BY MOUTH DAILY  BEFORE BREAKFAST    ezetimibe (ZETIA) 10 MG tablet Take 1 tablet (10 mg total) by mouth daily.    ferrous sulfate 325 (65 FE) MG EC tablet Take 1 tablet (325 mg total) by mouth 2 (two) times daily.    FIBER PO Take 1 tablet by mouth daily as needed (constipation).    finasteride (PROSCAR) 5 MG tablet TAKE 1 TABLET BY MOUTH DAILY    furosemide (LASIX) 80 MG tablet Take 1 tablet (80 mg total) by mouth daily. morning    gabapentin (NEURONTIN) 300 MG capsule TAKE 1 CAPSULE BY MOUTH TWICE  DAILY    hydrALAZINE (APRESOLINE) 50 MG tablet TAKE 1 TABLET BY MOUTH IN THE  MORNING AND AT BEDTIME    hydrOXYzine (ATARAX) 10 MG tablet TAKE 1 TABLET BY MOUTH EVERY 8  HOURS AS NEEDED FOR ITCHING    icosapent Ethyl (VASCEPA) 1 g capsule Take 2 capsules (2 g total) by mouth 2 (two) times daily.  ketoconazole (NIZORAL) 2 % cream Apply to both feet and between toes once daily for 6 weeks. (Patient taking differently: as needed for irritation. Apply to both feet and between toes once daily for 6 weeks.)    losartan (COZAAR) 100 MG tablet TAKE 1 TABLET BY MOUTH DAILY    Multiple Vitamins-Minerals (MENS 50+ MULTIVITAMIN) TABS Take 1 tablet by mouth daily.    nicotine (NICODERM CQ) 14 mg/24hr patch Place 1 patch (14 mg total) onto the skin daily.    nicotine (NICODERM CQ) 7 mg/24hr patch Place 1 patch (7 mg total) onto the skin daily.    omeprazole (PRILOSEC) 40 MG capsule TAKE 1 CAPSULE BY MOUTH DAILY    rosuvastatin  (CRESTOR) 40 MG tablet Take 1 tablet (40 mg total) by mouth daily.    SPIRIVA RESPIMAT 2.5 MCG/ACT AERS USE 2 INHALATIONS BY MOUTH DAILY 02/15/2023: Endorses missed doses   triamcinolone ointment (KENALOG) 0.1 % APPLY TOPICALLY TO AFFECTED  AREA(S) TWICE DAILY AS NEEDED  FOR ITCHING    No facility-administered encounter medications on file as of 05/01/2023.    Allergies: No Known Allergies  Family History: Family History  Problem Relation Age of Onset   Kidney disease Mother    Diabetes Mother    Cancer Brother     Social History: Social History   Tobacco Use   Smoking status: Some Days    Current packs/day: 0.25    Average packs/day: 0.3 packs/day for 30.0 years (7.5 ttl pk-yrs)    Types: Cigarettes   Smokeless tobacco: Never   Tobacco comments:    Patient smokes a pack a week    Patient vape  Vaping Use   Vaping status: Never Used  Substance Use Topics   Alcohol use: Yes    Comment: some days   Drug use: Yes    Types: Marijuana    Comment: occ   Social History   Social History Narrative   Not on file    Vital Signs:  There were no vitals taken for this visit.   General Medical Exam:  *** General:  Well appearing, comfortable.   Eyes/ENT: see cranial nerve examination.   Neck:   No carotid bruits. Respiratory:  Clear to auscultation, good air entry bilaterally.   Cardiac:  Regular rate and rhythm, no murmur.   Extremities:  No deformities, edema, or skin discoloration.  Skin:  No rashes or lesions.  Neurological Exam: MENTAL STATUS including orientation to time, place, person, recent and remote memory, attention span and concentration, language, and fund of knowledge is ***normal.  Speech is not dysarthric.  CRANIAL NERVES: II:  No visual field defects.  ***   III-IV-VI: Pupils equal round and reactive to light.  Normal conjugate, extra-ocular eye movements in all directions of gaze.  No nystagmus.  No ptosis***.   V:  Normal facial sensation.    VII:   Normal facial symmetry and movements.   VIII:  Normal hearing and vestibular function.   IX-X:  Normal palatal movement.   XI:  Normal shoulder shrug and head rotation.   XII:  Normal tongue strength and range of motion, no deviation or fasciculation.  MOTOR:  No atrophy, fasciculations or abnormal movements.  No pronator drift.   Upper Extremity:  Right  Left  Deltoid  5/5   5/5   Biceps  5/5   5/5   Triceps  5/5   5/5   Infraspinatus 5/5  5/5  Medial pectoralis 5/5  5/5  Wrist extensors  5/5   5/5   Wrist flexors  5/5   5/5   Finger extensors  5/5   5/5   Finger flexors  5/5   5/5   Dorsal interossei  5/5   5/5   Abductor pollicis  5/5   5/5   Tone (Ashworth scale)  0  0   Lower Extremity:  Right  Left  Hip flexors  5/5   5/5   Hip extensors  5/5   5/5   Adductor 5/5  5/5  Abductor 5/5  5/5  Knee flexors  5/5   5/5   Knee extensors  5/5   5/5   Dorsiflexors  5/5   5/5   Plantarflexors  5/5   5/5   Toe extensors  5/5   5/5   Toe flexors  5/5   5/5   Tone (Ashworth scale)  0  0   MSRs:                                           Right        Left brachioradialis 2+  2+  biceps 2+  2+  triceps 2+  2+  patellar 2+  2+  ankle jerk 2+  2+  Hoffman no  no  plantar response down  down   SENSORY:  Normal and symmetric perception of light touch, pinprick, vibration, and proprioception.  Romberg's sign absent.   COORDINATION/GAIT: Normal finger-to- nose-finger***.  Intact rapid alternating movements bilaterally.  Able to rise from a chair without using arms.  Gait narrow based and stable. Tandem and stressed gait intact.    ***   Thank you for allowing me to participate in patient's care.  If I can answer any additional questions, I would be pleased to do so.    Sincerely,    Carry Weesner K. Allena Katz, DO

## 2023-05-01 ENCOUNTER — Ambulatory Visit: Payer: 59 | Admitting: Neurology

## 2023-05-01 ENCOUNTER — Encounter: Payer: Self-pay | Admitting: Neurology

## 2023-05-24 ENCOUNTER — Other Ambulatory Visit: Payer: Self-pay | Admitting: Internal Medicine

## 2023-05-30 ENCOUNTER — Other Ambulatory Visit: Payer: Self-pay | Admitting: Internal Medicine

## 2023-05-31 ENCOUNTER — Other Ambulatory Visit: Payer: Self-pay | Admitting: Internal Medicine

## 2023-06-07 ENCOUNTER — Ambulatory Visit: Payer: 59 | Admitting: Student

## 2023-06-07 ENCOUNTER — Encounter: Payer: Self-pay | Admitting: Student

## 2023-06-07 VITALS — BP 125/69 | HR 71 | Temp 98.1°F | Ht 69.0 in | Wt 217.9 lb

## 2023-06-07 DIAGNOSIS — M79675 Pain in left toe(s): Secondary | ICD-10-CM

## 2023-06-07 DIAGNOSIS — R7303 Prediabetes: Secondary | ICD-10-CM | POA: Diagnosis not present

## 2023-06-07 DIAGNOSIS — N1832 Chronic kidney disease, stage 3b: Secondary | ICD-10-CM | POA: Diagnosis not present

## 2023-06-07 LAB — POCT GLYCOSYLATED HEMOGLOBIN (HGB A1C): Hemoglobin A1C: 6.8 % — AB (ref 4.0–5.6)

## 2023-06-07 LAB — GLUCOSE, CAPILLARY: Glucose-Capillary: 83 mg/dL (ref 70–99)

## 2023-06-07 MED ORDER — PREDNISONE 20 MG PO TABS: 40.0000 mg | ORAL_TABLET | Freq: Every day | ORAL | 0 refills | Status: DC

## 2023-06-07 NOTE — Assessment & Plan Note (Addendum)
 This patient has a history of chronic kidney disease stage III and also diastolic heart failure.  Presented to me in the office today due to concerns for bilateral lower extremity swelling that he states is chronic but has also recently developed a left great toe pain for the past 2 to 3 days.  On exam, the left great toe is  mildly swollen compared to the right.  This site is not warm and I  did not appreciate any signs of trauma or erythema makes me less concerned for infection at this time.  This patient has no history of gout and I wonder if this is  gout especially with the appreciated edema along the toe phalanx ,the MTP joint and foot metatarsal and not just localized at the MTP. Patient is not a candidate for colchicine or NSAID due to his kidney function.  I will try this patient on 5-day course of 40 mg prednisone presumably for gout.  He is advised to call us  if his symptoms worsens  - 40 mg prednisone for 5 days

## 2023-06-07 NOTE — Patient Instructions (Addendum)
 Thank you, Seth Santana for allowing [place] to provide your care today. Today we discussed your toe pain .I am sending you a 5 day course of  40 mg prednisone.   If your great toe pain get worse and you begin to experience fevers and chills and warmth in that area please let us know.  I have ordered the following labs for you:  Lab Orders         Glucose, capillary         Microalbumin / Creatinine Urine Ratio         POC Hbg A1C       Tests ordered today:    Referrals ordered today:   Referral Orders  No referral(s) requested today     I have ordered the following medication/changed the following medications:   Stop the following medications: There are no discontinued medications.   Start the following medications: Meds ordered this encounter  Medications   predniSONE (DELTASONE) 20 MG tablet    Sig: Take 2 tablets (40 mg total) by mouth daily.    Dispense:  10 tablet    Refill:  0     Follow up: 2 months   Remember:   Should you have any questions or concerns please call the internal medicine clinic at [phone].    Kathleen Lime, M.D [facility]

## 2023-06-07 NOTE — Assessment & Plan Note (Signed)
 Patient has a great follow up with [facility] and take Jardiance. No active concern at this time. - Encourage continued follow up

## 2023-06-07 NOTE — Assessment & Plan Note (Signed)
 Lab Results  Component Value Date   HGBA1C 6.8 (A) 06/07/2023   HGBA1C 5.7 (A) 03/22/2022   HGBA1C 5.3 05/23/2013  Hgb A1c steadily getting worse 5.3>5.7>6.8.  He however denies any polydipsia polyuria or any neuropathy at this time.  Dietary modifications adequately discussed with the patient today.  Patient continues to choose lifestyle modification versus medical therapy at this time.  -Will recheck his hemoglobin A1c in 3 months.  If his hemoglobin A1c at that time continue to get worse into diabetes range,  consider initiating medical therapy at that time .

## 2023-06-07 NOTE — Progress Notes (Signed)
 CC: Great left toe pain  HPI:  Seth Santana is a 73 y.o. male living with a history stated below and presents today for left toe pain.Marland Kitchen Please see problem based assessment and plan for additional details.  Past Medical History:  Diagnosis Date   Blood in stool last few years   BPH (benign prostatic hyperplasia)    Closed fracture of tripod (HCC) 02/18/2013   Hard of hearing    Headache(784.0)    Hypertension     Current Outpatient Medications on File Prior to Visit  Medication Sig Dispense Refill   acetaminophen (TYLENOL) 500 MG tablet Take 500 mg by mouth every 6 (six) hours as needed for moderate pain.     aspirin EC 81 MG tablet Take 1 tablet (81 mg total) by mouth daily. Swallow whole. 90 tablet 3   augmented betamethasone dipropionate (DIPROLENE-AF) 0.05 % cream as needed (dry skin).     carvedilol (COREG) 12.5 MG tablet TAKE 1 TABLET BY MOUTH TWICE  DAILY WITH MEALS 200 tablet 2   CLENPIQ 10-3.5-12 MG-GM -GM/175ML SOLN 175 mL orally twice for 2     Cyanocobalamin (B-12 PO) Take 1 tablet by mouth daily as needed (energy).     empagliflozin (JARDIANCE) 10 MG TABS tablet TAKE 1 TABLET BY MOUTH DAILY  BEFORE BREAKFAST 100 tablet 3   ezetimibe (ZETIA) 10 MG tablet TAKE 1 TABLET BY MOUTH DAILY 90 tablet 3   ferrous sulfate 325 (65 FE) MG EC tablet Take 1 tablet (325 mg total) by mouth 2 (two) times daily. 180 tablet 1   FIBER PO Take 1 tablet by mouth daily as needed (constipation).     finasteride (PROSCAR) 5 MG tablet TAKE 1 TABLET BY MOUTH DAILY 100 tablet 2   furosemide (LASIX) 80 MG tablet TAKE 1 TABLET BY MOUTH DAILY IN  THE MORNING 90 tablet 3   gabapentin (NEURONTIN) 300 MG capsule TAKE 1 CAPSULE BY MOUTH TWICE  DAILY 180 capsule 3   hydrALAZINE (APRESOLINE) 50 MG tablet TAKE 1 TABLET BY MOUTH IN THE  MORNING AND AT BEDTIME 100 tablet 0   hydrOXYzine (ATARAX) 10 MG tablet TAKE 1 TABLET BY MOUTH EVERY 8  HOURS AS NEEDED FOR ITCHING 60 tablet 0   icosapent Ethyl  (VASCEPA) 1 g capsule Take 2 capsules (2 g total) by mouth 2 (two) times daily. 360 capsule 3   ketoconazole (NIZORAL) 2 % cream Apply to both feet and between toes once daily for 6 weeks. (Patient taking differently: as needed for irritation. Apply to both feet and between toes once daily for 6 weeks.) 60 g 1   losartan (COZAAR) 100 MG tablet TAKE 1 TABLET BY MOUTH DAILY 100 tablet 2   Multiple Vitamins-Minerals (MENS 50+ MULTIVITAMIN) TABS Take 1 tablet by mouth daily.     nicotine (NICODERM CQ) 14 mg/24hr patch Place 1 patch (14 mg total) onto the skin daily. 14 patch 0   nicotine (NICODERM CQ) 7 mg/24hr patch Place 1 patch (7 mg total) onto the skin daily. 14 patch 0   omeprazole (PRILOSEC) 40 MG capsule TAKE 1 CAPSULE BY MOUTH DAILY 100 capsule 2   rosuvastatin (CRESTOR) 40 MG tablet Take 1 tablet (40 mg total) by mouth daily. 90 tablet 2   SPIRIVA RESPIMAT 2.5 MCG/ACT AERS USE 2 INHALATIONS BY MOUTH DAILY 12 g 3   triamcinolone ointment (KENALOG) 0.1 % APPLY TOPICALLY TO AFFECTED  AREA(S) TWICE DAILY AS NEEDED  FOR ITCHING 30 g 0  No current facility-administered medications on file prior to visit.    Family History  Problem Relation Age of Onset   Kidney disease Mother    Diabetes Mother    Cancer Brother     Social History   Socioeconomic History   Marital status: Single    Spouse name: Not on file   Number of children: Not on file   Years of education: Not on file   Highest education level: Not on file  Occupational History   Not on file  Tobacco Use   Smoking status: Some Days    Current packs/day: 0.25    Average packs/day: 0.3 packs/day for 30.0 years (7.5 ttl pk-yrs)    Types: Cigarettes   Smokeless tobacco: Never   Tobacco comments:    Patient smokes a pack a week    Patient vape  Vaping Use   Vaping status: Never Used  Substance and Sexual Activity   Alcohol use: Yes    Comment: some days   Drug use: Yes    Types: Marijuana    Comment: occ   Sexual  activity: Not on file  Other Topics Concern   Not on file  Social History Narrative   Not on file   Social Drivers of Health   Financial Resource Strain: High Risk (09/18/2022)   Overall Financial Resource Strain (CARDIA)    Difficulty of Paying Living Expenses: Very hard  Food Insecurity: Food Insecurity Present (09/18/2022)   Hunger Vital Sign    Worried About Running Out of Food in the Last Year: Often true    Ran Out of Food in the Last Year: Often true  Transportation Needs: No Transportation Needs (09/18/2022)   PRAPARE - Administrator, Civil Service (Medical): No    Lack of Transportation (Non-Medical): No  Physical Activity: Insufficiently Active (09/18/2022)   Exercise Vital Sign    Days of Exercise per Week: 1 day    Minutes of Exercise per Session: 30 min  Stress: No Stress Concern Present (09/18/2022)   [employer] of Occupational Health - Occupational Stress Questionnaire    Feeling of Stress : Not at all  Social Connections: Socially Isolated (09/18/2022)   Social Connection and Isolation Panel [NHANES]    Frequency of Communication with Friends and Family: Three times a week    Frequency of Social Gatherings with Friends and Family: Once a week    Attends Religious Services: Never    Database administrator or Organizations: No    Attends Banker Meetings: Never    Marital Status: Never married  Intimate Partner Violence: Not At Risk (09/18/2022)   Humiliation, Afraid, Rape, and Kick questionnaire    Fear of Current or Ex-Partner: No    Emotionally Abused: No    Physically Abused: No    Sexually Abused: No    Review of Systems: ROS negative except for what is noted on the assessment and plan.  Vitals:   06/07/23 1323 06/07/23 1336  BP: (!) 155/72 125/69  Pulse: 79 71  Temp: 98.1 F (36.7 C)   TempSrc: Oral   SpO2: 100%   Weight: 217 lb 14.4 oz (98.8 kg)   Height: 5\' 9"  (1.753 m)     Physical Exam: Constitutional:  well-appearing man , sitting in chair , in no acute distress Cardiovascular: regular rate and rhythm, no m/r/g Pulmonary/Chest: normal work of breathing on room air, lungs clear to auscultation bilaterally Abdominal: soft, non-tender, non-distended MSK: normal bulk and  tone  Neurological: alert & oriented x 3, no focal deficit Skin: warm and dry Psych: normal mood and behavior  Assessment & Plan:   Chronic kidney disease, stage 3b (HCC) Patient has a great follow up with [facility] and take Jardiance. No active concern at this time. - Encourage continued follow up   Great toe pain, left This patient has a history of chronic kidney disease stage III and also diastolic heart failure.  Presented to me in the office today due to concerns for bilateral lower extremity swelling that he states is chronic but has also recently developed a left great toe pain for the past 2 to 3 days.  On exam, the left great toe is  mildly swollen compared to the right.  This site is not warm and I  did not appreciate any signs of trauma or erythema makes me less concerned for infection at this time.  This patient has no history of gout and I wonder if this is  gout especially with the appreciated edema along the toe phalanx ,the MTP joint and foot metatarsal and not just localized at the MTP. Patient is not a candidate for colchicine or NSAID due to his kidney function.  I will try this patient on 5-day course of 40 mg prednisone presumably for gout.  He is advised to call us  if his symptoms worsens  - 40 mg prednisone for 5 days  Prediabetes Lab Results  Component Value Date   HGBA1C 6.8 (A) 06/07/2023   HGBA1C 5.7 (A) 03/22/2022   HGBA1C 5.3 05/23/2013  Hgb A1c steadily getting worse 5.3>5.7>6.8.  He however denies any polydipsia polyuria or any neuropathy at this time.  Dietary modifications adequately discussed with the patient today.  Patient continues to choose lifestyle modification versus medical therapy at this  time.  -Will recheck his hemoglobin A1c in 3 months.  If his hemoglobin A1c at that time continue to get worse into diabetes range,  consider initiating medical therapy at that time .       Patient discussed with Dr. Rip Harbour, M.D [facility] Phone: [phone] Date 06/07/2023 Time 5:07 PM

## 2023-06-08 ENCOUNTER — Other Ambulatory Visit: Payer: Self-pay

## 2023-06-08 DIAGNOSIS — M79675 Pain in left toe(s): Secondary | ICD-10-CM

## 2023-06-08 MED ORDER — PREDNISONE 20 MG PO TABS: 40.0000 mg | ORAL_TABLET | Freq: Every day | ORAL | 0 refills | Status: DC

## 2023-06-08 NOTE — Telephone Encounter (Signed)
 Patients daughter called regarding a rx for prednisone, the [facility] was [facility] [address] per patients daughter the rx was supposed to be sent to [facility] on [address]. Please send in a new rx to the correct pharmacy.   [id number] - [place], [place] - [address] AT [facility] OF [address] & [address] [phone]

## 2023-06-11 LAB — MICROALBUMIN / CREATININE URINE RATIO

## 2023-06-13 ENCOUNTER — Ambulatory Visit (INDEPENDENT_AMBULATORY_CARE_PROVIDER_SITE_OTHER): Payer: 59 | Admitting: Podiatry

## 2023-06-13 DIAGNOSIS — Z91198 Patient's noncompliance with other medical treatment and regimen for other reason: Secondary | ICD-10-CM

## 2023-06-13 NOTE — Addendum Note (Signed)
 Addended by: Dickie La on: 06/13/2023 11:11 AM   Modules accepted: Level of Service

## 2023-06-13 NOTE — Progress Notes (Signed)
 Internal Medicine Clinic Attending  Case discussed with the resident at the time of the visit.  We reviewed the resident's history and exam and pertinent patient test results.  I agree with the assessment, diagnosis, and plan of care documented in the resident's note. Left great toe with significant tenderness to palpation, swelling and warmth extending into the midfoot. No evidence of wound. Concern for acute gout flare, prescribed short course of steroids. F/u with podiatry already scheduled for 3/5.

## 2023-06-15 ENCOUNTER — Other Ambulatory Visit: Payer: Self-pay | Admitting: Internal Medicine

## 2023-06-17 NOTE — Progress Notes (Signed)
 1. Failure to attend appointment with reason given    Patient called and rescheduled appointment.

## 2023-07-09 ENCOUNTER — Ambulatory Visit (INDEPENDENT_AMBULATORY_CARE_PROVIDER_SITE_OTHER)

## 2023-07-09 DIAGNOSIS — Z0000 Encounter for general adult medical examination without abnormal findings: Secondary | ICD-10-CM

## 2023-07-09 NOTE — Patient Instructions (Signed)
 Health Maintenance, Male  Adopting a healthy lifestyle and getting preventive care are important in promoting health and wellness. Ask your health care provider about:  The right schedule for you to have regular tests and exams.  Things you can do on your own to prevent diseases and keep yourself healthy.  What should I know about diet, weight, and exercise?  Eat a healthy diet    Eat a diet that includes plenty of vegetables, fruits, low-fat dairy products, and lean protein.  Do not eat a lot of foods that are high in solid fats, added sugars, or sodium.  Maintain a healthy weight  Body mass index (BMI) is a measurement that can be used to identify possible weight problems. It estimates body fat based on height and weight. Your health care provider can help determine your BMI and help you achieve or maintain a healthy weight.  Get regular exercise  Get regular exercise. This is one of the most important things you can do for your health. Most adults should:  Exercise for at least 150 minutes each week. The exercise should increase your heart rate and make you sweat (moderate-intensity exercise).  Do strengthening exercises at least twice a week. This is in addition to the moderate-intensity exercise.  Spend less time sitting. Even light physical activity can be beneficial.  Watch cholesterol and blood lipids  Have your blood tested for lipids and cholesterol at 71 years of age, then have this test every 5 years.  You may need to have your cholesterol levels checked more often if:  Your lipid or cholesterol levels are high.  You are older than 71 years of age.  You are at high risk for heart disease.  What should I know about cancer screening?  Many types of cancers can be detected early and may often be prevented. Depending on your health history and family history, you may need to have cancer screening at various ages. This may include screening for:  Colorectal cancer.  Prostate cancer.  Skin cancer.  Lung  cancer.  What should I know about heart disease, diabetes, and high blood pressure?  Blood pressure and heart disease  High blood pressure causes heart disease and increases the risk of stroke. This is more likely to develop in people who have high blood pressure readings or are overweight.  Talk with your health care provider about your target blood pressure readings.  Have your blood pressure checked:  Every 3-5 years if you are 9-95 years of age.  Every year if you are 85 years old or older.  If you are between the ages of 29 and 29 and are a current or former smoker, ask your health care provider if you should have a one-time screening for abdominal aortic aneurysm (AAA).  Diabetes  Have regular diabetes screenings. This checks your fasting blood sugar level. Have the screening done:  Once every three years after age 23 if you are at a normal weight and have a low risk for diabetes.  More often and at a younger age if you are overweight or have a high risk for diabetes.  What should I know about preventing infection?  Hepatitis B  If you have a higher risk for hepatitis B, you should be screened for this virus. Talk with your health care provider to find out if you are at risk for hepatitis B infection.  Hepatitis C  Blood testing is recommended for:  Everyone born from [birth year] through 1963.  Anyone  with known risk factors for hepatitis C.  Sexually transmitted infections (STIs)  You should be screened each year for STIs, including gonorrhea and chlamydia, if:  You are sexually active and are younger than 71 years of age.  You are older than 71 years of age and your health care provider tells you that you are at risk for this type of infection.  Your sexual activity has changed since you were last screened, and you are at increased risk for chlamydia or gonorrhea. Ask your health care provider if you are at risk.  Ask your health care provider about whether you are at high risk for HIV. Your health care provider  may recommend a prescription medicine to help prevent HIV infection. If you choose to take medicine to prevent HIV, you should first get tested for HIV. You should then be tested every 3 months for as long as you are taking the medicine.  Follow these instructions at home:  Alcohol use  Do not drink alcohol if your health care provider tells you not to drink.  If you drink alcohol:  Limit how much you have to 0-2 drinks a day.  Know how much alcohol is in your drink. In the [place] one drink equals one 12 oz bottle of beer (355 mL), one 5 oz glass of wine (148 mL), or one 1 oz glass of hard liquor (44 mL).  Lifestyle  Do not use any products that contain nicotine or tobacco. These products include cigarettes, chewing tobacco, and vaping devices, such as e-cigarettes. If you need help quitting, ask your health care provider.  Do not use street drugs.  Do not share needles.  Ask your health care provider for help if you need support or information about quitting drugs.  General instructions  Schedule regular health, dental, and eye exams.  Stay current with your vaccines.  Tell your health care provider if:  You often feel depressed.  You have ever been abused or do not feel safe at home.  Summary  Adopting a healthy lifestyle and getting preventive care are important in promoting health and wellness.  Follow your health care provider's instructions about healthy diet, exercising, and getting tested or screened for diseases.  Follow your health care provider's instructions on monitoring your cholesterol and blood pressure.  This information is not intended to replace advice given to you by your health care provider. Make sure you discuss any questions you have with your health care provider.  Document Revised: 08/16/2020 Document Reviewed: 08/16/2020  Elsevier Patient Education  2024 [employer].

## 2023-07-09 NOTE — Progress Notes (Signed)
 Subjective:   Seth Santana is a 73 y.o. male who presents for Medicare Annual/Subsequent preventive examination.  Visit Complete: Virtual I connected with  Shirlee More on 07/09/23 by a audio enabled telemedicine application and verified that I am speaking with the correct person using two identifiers.  Patient Location: Home  Provider Location: Office/Clinic  I discussed the limitations of evaluation and management by telemedicine. The patient expressed understanding and agreed to proceed.  Vital Signs: Because this visit was a virtual/telehealth visit, some criteria may be missing or patient reported. Any vitals not documented were not able to be obtained and vitals that have been documented are patient reported.       Objective:    Today's Vitals   07/09/23 1049  PainSc: 7    There is no height or weight on file to calculate BMI.     07/09/2023   10:54 AM 09/18/2022    8:51 AM 08/07/2022   10:05 AM 08/07/2022   10:04 AM 07/24/2022   10:29 AM 07/03/2022    8:53 AM 06/16/2022    8:59 AM  Advanced Directives  Does Patient Have a Medical Advance Directive? No No No No No No No  Would patient like information on creating a medical advance directive? No - Patient declined No - Patient declined  No - Patient declined No - Patient declined No - Patient declined No - Patient declined    Current Medications (verified) Outpatient Encounter Medications as of 07/09/2023  Medication Sig   acetaminophen (TYLENOL) 500 MG tablet Take 500 mg by mouth every 6 (six) hours as needed for moderate pain.   aspirin EC 81 MG tablet Take 1 tablet (81 mg total) by mouth daily. Swallow whole.   augmented betamethasone dipropionate (DIPROLENE-AF) 0.05 % cream as needed (dry skin).   carvedilol (COREG) 12.5 MG tablet TAKE 1 TABLET BY MOUTH TWICE  DAILY WITH MEALS   CLENPIQ 10-3.5-12 MG-GM -GM/175ML SOLN 175 mL orally twice for 2   Cyanocobalamin (B-12 PO) Take 1 tablet by mouth daily as needed  (energy).   empagliflozin (JARDIANCE) 10 MG TABS tablet TAKE 1 TABLET BY MOUTH DAILY  BEFORE BREAKFAST   ezetimibe (ZETIA) 10 MG tablet TAKE 1 TABLET BY MOUTH DAILY   FIBER PO Take 1 tablet by mouth daily as needed (constipation).   finasteride (PROSCAR) 5 MG tablet TAKE 1 TABLET BY MOUTH DAILY   furosemide (LASIX) 80 MG tablet TAKE 1 TABLET BY MOUTH DAILY IN  THE MORNING   gabapentin (NEURONTIN) 300 MG capsule TAKE 1 CAPSULE BY MOUTH TWICE  DAILY   hydrALAZINE (APRESOLINE) 50 MG tablet TAKE 1 TABLET BY MOUTH IN THE  MORNING AND AT BEDTIME   hydrOXYzine (ATARAX) 10 MG tablet TAKE 1 TABLET BY MOUTH EVERY 8  HOURS AS NEEDED FOR ITCHING   icosapent Ethyl (VASCEPA) 1 g capsule Take 2 capsules (2 g total) by mouth 2 (two) times daily.   ketoconazole (NIZORAL) 2 % cream Apply to both feet and between toes once daily for 6 weeks. (Patient taking differently: as needed for irritation. Apply to both feet and between toes once daily for 6 weeks.)   losartan (COZAAR) 100 MG tablet TAKE 1 TABLET BY MOUTH DAILY   Multiple Vitamins-Minerals (MENS 50+ MULTIVITAMIN) TABS Take 1 tablet by mouth daily.   nicotine (NICODERM CQ) 14 mg/24hr patch Place 1 patch (14 mg total) onto the skin daily.   nicotine (NICODERM CQ) 7 mg/24hr patch Place 1 patch (7 mg total)  onto the skin daily.   omeprazole (PRILOSEC) 40 MG capsule TAKE 1 CAPSULE BY MOUTH DAILY   predniSONE (DELTASONE) 20 MG tablet Take 2 tablets (40 mg total) by mouth daily.   rosuvastatin (CRESTOR) 40 MG tablet Take 1 tablet (40 mg total) by mouth daily.   SPIRIVA RESPIMAT 2.5 MCG/ACT AERS USE 2 INHALATIONS BY MOUTH DAILY   triamcinolone ointment (KENALOG) 0.1 % APPLY TOPICALLY TO AFFECTED  AREA(S) TWICE DAILY AS NEEDED  FOR ITCHING   ferrous sulfate 325 (65 FE) MG EC tablet Take 1 tablet (325 mg total) by mouth 2 (two) times daily.   No facility-administered encounter medications on file as of 07/09/2023.    Allergies (verified) Patient has no known  allergies.   History: Past Medical History:  Diagnosis Date   Blood in stool last few years   BPH (benign prostatic hyperplasia)    Closed fracture of tripod (HCC) 02/18/2013   Hard of hearing    Headache(784.0)    Hypertension    Past Surgical History:  Procedure Laterality Date   COLONOSCOPY WITH PROPOFOL N/A 10/09/2013   Procedure: COLONOSCOPY WITH PROPOFOL;  Surgeon: Charolett Bumpers, MD;  Location: [facility];  Service: Endoscopy;  Laterality: N/A;   NO PAST SURGERIES     ORCHIECTOMY Right 03/15/2022   Procedure: INGUINAL RADICAL ORCHIECTOMY;  Surgeon: Sebastian Ache, MD;  Location: [facility];  Service: Urology;  Laterality: Right;   Family History  Problem Relation Age of Onset   Kidney disease Mother    Diabetes Mother    Cancer Brother    Social History   Socioeconomic History   Marital status: Single    Spouse name: Not on file   Number of children: Not on file   Years of education: Not on file   Highest education level: Not on file  Occupational History   Not on file  Tobacco Use   Smoking status: Some Days    Current packs/day: 0.25    Average packs/day: 0.3 packs/day for 30.0 years (7.5 ttl pk-yrs)    Types: Cigarettes   Smokeless tobacco: Never   Tobacco comments:    Patient smokes a pack a week    Patient vape  Vaping Use   Vaping status: Never Used  Substance and Sexual Activity   Alcohol use: Yes    Comment: some days   Drug use: Yes    Types: Marijuana    Comment: occ   Sexual activity: Not on file  Other Topics Concern   Not on file  Social History Narrative   Not on file   Social Drivers of Health   Financial Resource Strain: Medium Risk (07/09/2023)   Overall Financial Resource Strain (CARDIA)    Difficulty of Paying Living Expenses: Somewhat hard  Food Insecurity: Food Insecurity Present (07/09/2023)   Hunger Vital Sign    Worried About Running Out of Food in the Last Year: Often true    Ran Out of Food in the Last Year: Often true   Transportation Needs: No Transportation Needs (07/09/2023)   PRAPARE - Administrator, Civil Service (Medical): No    Lack of Transportation (Non-Medical): No  Physical Activity: Insufficiently Active (07/09/2023)   Exercise Vital Sign    Days of Exercise per Week: 3 days    Minutes of Exercise per Session: 30 min  Stress: No Stress Concern Present (07/09/2023)   [employer] of Occupational Health - Occupational Stress Questionnaire    Feeling of Stress : Not  at all  Social Connections: Socially Isolated (07/09/2023)   Social Connection and Isolation Panel [NHANES]    Frequency of Communication with Friends and Family: Twice a week    Frequency of Social Gatherings with Friends and Family: Twice a week    Attends Religious Services: Never    Diplomatic Services operational officer: No    Attends Engineer, structural: Never    Marital Status: Never married    Tobacco Counseling Ready to quit: Not Answered Counseling given: Not Answered Tobacco comments: Patient smokes a pack a week Patient vape   Clinical Intake:  Pre-visit preparation completed: Yes  Pain : 0-10 Pain Score: 7  Pain Type: Chronic pain Pain Location: Leg Pain Orientation: Left, Right Pain Descriptors / Indicators: Constant Pain Onset: More than a month ago     Diabetes: No  How often do you need to have someone help you when you read instructions, pamphlets, or other written materials from your doctor or pharmacy?: 1 - Never What is the last grade level you completed in school?: 12 GRADE  Interpreter Needed?: No  Information entered by :: [facility] Bhavin Monjaraz   Activities of Daily Living    09/18/2022    8:51 AM 08/07/2022   10:04 AM  In your present state of health, do you have any difficulty performing the following activities:  Hearing? 0 0  Vision? 0 0  Difficulty concentrating or making decisions? 1 1  Walking or climbing stairs? 1 1  Dressing or bathing? 0 0  Doing  errands, shopping? 0 0    Patient Care Team: Chauncey Mann, DO as PCP - General (Internal Medicine) Christell Constant, MD as PCP - Cardiology (Cardiology)  Indicate any recent Medical Services you may have received from other than [facility] providers in the past year (date may be approximate).     Assessment:   This is a routine wellness examination for [employer].  Hearing/Vision screen No results found.   Goals Addressed   None   Depression Screen    07/09/2023   10:54 AM 06/07/2023    1:27 PM 09/18/2022    9:04 AM 08/07/2022   10:04 AM 07/24/2022   10:42 AM 07/24/2022   10:37 AM 06/16/2022    8:58 AM  PHQ 2/9 Scores  PHQ - 2 Score 0 0 0 0 0 0 0  PHQ- 9 Score 0 0  0       Fall Risk    07/09/2023   10:55 AM 06/07/2023    1:27 PM 09/18/2022    8:51 AM 08/07/2022   10:04 AM 07/24/2022   10:29 AM  Fall Risk   Falls in the past year? 0 0 0 0 0  Number falls in past yr: 0 0 0  0  Injury with Fall? 0 0 0  0  Risk for fall due to : Impaired balance/gait No Fall Risks No Fall Risks No Fall Risks   Follow up Falls prevention discussed;Falls evaluation completed Falls evaluation completed;Falls prevention discussed Falls prevention discussed;Falls evaluation completed Falls evaluation completed Falls evaluation completed    MEDICARE RISK AT HOME: Medicare Risk at Home Any stairs in or around the home?: Yes If so, are there any without handrails?: No Home free of loose throw rugs in walkways, pet beds, electrical cords, etc?: Yes Adequate lighting in your home to reduce risk of falls?: Yes Life alert?: No Use of a cane, walker or w/c?: Yes Grab bars in the bathroom?: No Shower  chair or bench in shower?: No Elevated toilet seat or a handicapped toilet?: No  TIMED UP AND GO:  Was the test performed?  No    Cognitive Function:        07/09/2023   10:55 AM 06/02/2022   10:41 AM  6CIT Screen  What Year? 0 points 0 points  What month? 0 points 0 points  What time? 0 points 0  points  Count back from 20 0 points 0 points  Months in reverse 0 points 0 points  Repeat phrase 0 points 0 points  Total Score 0 points 0 points    Immunizations Immunization History  Administered Date(s) Administered   Influenza-Unspecified 12/27/2021   Moderna Sars-Covid-2 Vaccination 12/08/2019, 01/06/2020   PNEUMOCOCCAL CONJUGATE-20 03/16/2022   Tdap 02/08/2013    TDAP status: Due, Education has been provided regarding the importance of this vaccine. Advised may receive this vaccine at local pharmacy or Health Dept. Aware to provide a copy of the vaccination record if obtained from local pharmacy or Health Dept. Verbalized acceptance and understanding.  Flu Vaccine status: Due, Education has been provided regarding the importance of this vaccine. Advised may receive this vaccine at local pharmacy or Health Dept. Aware to provide a copy of the vaccination record if obtained from local pharmacy or Health Dept. Verbalized acceptance and understanding.  Pneumococcal vaccine status: Up to date  Covid-19 vaccine status: Completed vaccines  Qualifies for Shingles Vaccine? Yes   Zostavax completed No   Shingrix Completed?: No.    Education has been provided regarding the importance of this vaccine. Patient has been advised to call insurance company to determine out of pocket expense if they have not yet received this vaccine. Advised may also receive vaccine at local pharmacy or Health Dept. Verbalized acceptance and understanding.  Screening Tests Health Maintenance  Topic Date Due   OPHTHALMOLOGY EXAM  Never done   Hepatitis C Screening  Never done   Zoster Vaccines- Shingrix (1 of 2) Never done   COVID-19 Vaccine (3 - Moderna risk series) 02/03/2020   Colonoscopy  02/26/2021   INFLUENZA VACCINE  11/09/2022   DTaP/Tdap/Td (2 - Td or Tdap) 02/09/2023   FOOT EXAM  03/01/2023   Diabetic kidney evaluation - eGFR measurement  07/03/2023   HEMOGLOBIN A1C  12/05/2023   Diabetic  kidney evaluation - Urine ACR  06/06/2024   Medicare Annual Wellness (AWV)  07/08/2024   Pneumonia Vaccine 2+ Years old  Completed   HPV VACCINES  Aged Out    Health Maintenance  Health Maintenance Due  Topic Date Due   OPHTHALMOLOGY EXAM  Never done   Hepatitis C Screening  Never done   Zoster Vaccines- Shingrix (1 of 2) Never done   COVID-19 Vaccine (3 - Moderna risk series) 02/03/2020   Colonoscopy  02/26/2021   INFLUENZA VACCINE  11/09/2022   DTaP/Tdap/Td (2 - Td or Tdap) 02/09/2023   FOOT EXAM  03/01/2023   Diabetic kidney evaluation - eGFR measurement  07/03/2023    Colorectal cancer screening: Type of screening: Colonoscopy. Completed 02/26/2022. Repeat every 3 years  Lung Cancer Screening: (Low Dose CT Chest recommended if Age 96-80 years, 20 pack-year currently smoking OR have quit w/in 15years.) does not qualify.   Lung Cancer Screening Referral: DEFERRED TO PCP   Additional Screening:  Hepatitis C Screening:  DEFERRED TO PCP  OVER DUE   Vision Screening: Recommended annual ophthalmology exams for early detection of glaucoma and other disorders of the eye. Is the patient  up to date with their annual eye exam?  No  Who is the provider or what is the name of the office in which the patient attends annual eye exams?  [facility]  If pt is not established with a provider, would they like to be referred to a provider to establish care? No .   Dental Screening: Recommended annual dental exams for proper oral hygiene  Diabetic Foot Exam: N/A  Community Resource Referral / Chronic Care Management: CRR required this visit?  No   CCM required this visit?  No     Plan:     I have personally reviewed and noted the following in the patient's chart:   Medical and social history Use of alcohol, tobacco or illicit drugs  Current medications and supplements including opioid prescriptions. Patient is not currently taking opioid prescriptions. Functional ability and  status Nutritional status Physical activity Advanced directives List of other physicians Hospitalizations, surgeries, and ER visits in previous 12 months Vitals Screenings to include cognitive, depression, and falls Referrals and appointments  In addition, I have reviewed and discussed with patient certain preventive protocols, quality metrics, and best practice recommendations. A written personalized care plan for preventive services as well as general preventive health recommendations were provided to patient.     Derrell Lolling, CMA   07/09/2023   After Visit Summary: (Mail) Due to this being a telephonic visit, the after visit summary with patients personalized plan was offered to patient via mail   Nurse Notes: NON FACE TO FACE   Mr. Fuqua , Thank you for taking time to come for your Medicare Wellness Visit. I appreciate your ongoing commitment to your health goals. Please review the following plan we discussed and let me know if I can assist you in the future.   These are the goals we discussed:  Goals   None     This is a list of the screening recommended for you and due dates:  Health Maintenance  Topic Date Due   Eye exam for diabetics  Never done   Hepatitis C Screening  Never done   Zoster (Shingles) Vaccine (1 of 2) Never done   COVID-19 Vaccine (3 - Moderna risk series) 02/03/2020   Colon Cancer Screening  02/26/2021   Flu Shot  11/09/2022   DTaP/Tdap/Td vaccine (2 - Td or Tdap) 02/09/2023   Complete foot exam   03/01/2023   Yearly kidney function blood test for diabetes  07/03/2023   Hemoglobin A1C  12/05/2023   Yearly kidney health urinalysis for diabetes  06/06/2024   Medicare Annual Wellness Visit  07/08/2024   Pneumonia Vaccine  Completed   HPV Vaccine  Aged Out

## 2023-07-10 ENCOUNTER — Ambulatory Visit (INDEPENDENT_AMBULATORY_CARE_PROVIDER_SITE_OTHER): Admitting: Podiatry

## 2023-07-10 ENCOUNTER — Encounter: Payer: Self-pay | Admitting: Podiatry

## 2023-07-10 VITALS — Ht 69.0 in | Wt 217.0 lb

## 2023-07-10 DIAGNOSIS — M79675 Pain in left toe(s): Secondary | ICD-10-CM | POA: Diagnosis not present

## 2023-07-10 DIAGNOSIS — L84 Corns and callosities: Secondary | ICD-10-CM | POA: Diagnosis not present

## 2023-07-10 DIAGNOSIS — B351 Tinea unguium: Secondary | ICD-10-CM | POA: Diagnosis not present

## 2023-07-10 DIAGNOSIS — I739 Peripheral vascular disease, unspecified: Secondary | ICD-10-CM | POA: Diagnosis not present

## 2023-07-10 DIAGNOSIS — M79674 Pain in right toe(s): Secondary | ICD-10-CM | POA: Diagnosis not present

## 2023-07-10 NOTE — Progress Notes (Signed)
  Subjective:  Patient ID: Seth Santana, male    DOB: 11-Jun-1950,  MRN: [id number]  CHRISTINO MCGLINCHEY presents to clinic today for at risk foot care. Patient has h/o PAD and painful mycotic toenails x 10 which interfere with daily activities. Pain is relieved with periodic professional debridement. Patient saw Internal Medicine for right great toe pain and was treated for gout with course of prednisone. Today, he states his left foot feels much better. Patient states he still smokes cigarettes and a pack lasts him for about one week. Chief Complaint  Patient presents with   RFC    I am here for nail trim, PCP is Dr Ned Card, and seen every few months,   New problem(s): None.   PCP is Atway, Rayann N, DO.  No Known Allergies  Review of Systems: Negative except as noted in the HPI.  Objective: No changes noted in today's physical examination. There were no vitals filed for this visit. Seth Santana is a pleasant 73 y.o. male WD, WN in NAD. AAO x 3.  Vascular Examination: CFT <4 seconds b/l. DP pulses diminished b/l. PT pulses diminished b/l. Digital hair absent. Skin temperature gradient warm to cool b/l. No ischemia or gangrene. No cyanosis or clubbing noted b/l. Dependent edema noted b/l LE. Evidence of skin changes consistent with long term venous stasis BLE.   Neurological Examination: Sensation grossly intact b/l with 10 gram monofilament. Vibratory sensation intact b/l.   Dermatological Examination: No open wounds. No interdigital macerations.   Toenails 1-5 b/l thick, discolored, elongated with subungual debris and pain on dorsal palpation.   Hyperkeratotic lesion(s) right great toe.  No erythema, no edema, no drainage, no fluctuance. Hyperpigmentation consistent with findings of chronic venous insufficiency is present b/l lower extremities.  Musculoskeletal Examination: Muscle strength 5/5 to all lower extremity muscle groups bilaterally. Pes planus deformity noted  bilateral LE.  Radiographs: None  Last A1c:      Latest Ref Rng & Units 06/07/2023    1:48 PM  Hemoglobin A1C  Hemoglobin-A1c 4.0 - 5.6 % 6.8     Assessment/Plan: 1. Pain due to onychomycosis of toenails of both feet   2. Callus   3. PAD (peripheral artery disease) (HCC)   Followed by Cardiology for PAD. No tissue loss, no open wounds, no rest pain noted today. Consent given for treatment. Patient examined. All patient's and/or POA's questions/concerns addressed on today's visit. Mycotic toenails 1-5 debrided in length and girth without incident. Callus(es) right great toe pared with sharp debridement without incident.Continue soft, supportive shoe gear daily. Report any pedal injuries to medical professional. Call office if there are any quesitons/concerns. Patient/POA to call should there be question/concern in the interim.   Return in about 3 months (around 10/09/2023).  Freddie Breech, DPM      [place] LOCATION: [address], [place] [address]                   Office [phone]   [facility] LOCATION: [address] [place], [place] [address] Office [phone]

## 2023-07-11 DIAGNOSIS — N189 Chronic kidney disease, unspecified: Secondary | ICD-10-CM | POA: Diagnosis not present

## 2023-07-11 DIAGNOSIS — G629 Polyneuropathy, unspecified: Secondary | ICD-10-CM | POA: Diagnosis not present

## 2023-07-11 DIAGNOSIS — I129 Hypertensive chronic kidney disease with stage 1 through stage 4 chronic kidney disease, or unspecified chronic kidney disease: Secondary | ICD-10-CM | POA: Diagnosis not present

## 2023-07-11 DIAGNOSIS — D631 Anemia in chronic kidney disease: Secondary | ICD-10-CM | POA: Diagnosis not present

## 2023-07-11 DIAGNOSIS — R799 Abnormal finding of blood chemistry, unspecified: Secondary | ICD-10-CM | POA: Diagnosis not present

## 2023-07-11 DIAGNOSIS — N1832 Chronic kidney disease, stage 3b: Secondary | ICD-10-CM | POA: Diagnosis not present

## 2023-07-11 DIAGNOSIS — E785 Hyperlipidemia, unspecified: Secondary | ICD-10-CM | POA: Diagnosis not present

## 2023-07-11 DIAGNOSIS — I5030 Unspecified diastolic (congestive) heart failure: Secondary | ICD-10-CM | POA: Diagnosis not present

## 2023-07-11 DIAGNOSIS — E559 Vitamin D deficiency, unspecified: Secondary | ICD-10-CM | POA: Diagnosis not present

## 2023-07-30 ENCOUNTER — Other Ambulatory Visit: Payer: Self-pay | Admitting: Internal Medicine

## 2023-07-31 NOTE — Telephone Encounter (Signed)
 Medication sent to pharmacy

## 2023-08-03 ENCOUNTER — Encounter: Admitting: Student

## 2023-08-09 ENCOUNTER — Other Ambulatory Visit: Payer: Self-pay | Admitting: Internal Medicine

## 2023-08-10 NOTE — Telephone Encounter (Signed)
 Medication sent to pharmacy

## 2023-08-16 ENCOUNTER — Other Ambulatory Visit: Payer: Self-pay | Admitting: Internal Medicine

## 2023-08-16 DIAGNOSIS — N1832 Chronic kidney disease, stage 3b: Secondary | ICD-10-CM

## 2023-08-17 NOTE — Telephone Encounter (Signed)
 Medication sent to pharmacy

## 2023-08-31 ENCOUNTER — Other Ambulatory Visit: Payer: Self-pay | Admitting: Internal Medicine

## 2023-10-23 ENCOUNTER — Ambulatory Visit (INDEPENDENT_AMBULATORY_CARE_PROVIDER_SITE_OTHER): Admitting: Podiatry

## 2023-10-23 ENCOUNTER — Encounter: Payer: Self-pay | Admitting: Podiatry

## 2023-10-23 DIAGNOSIS — B351 Tinea unguium: Secondary | ICD-10-CM | POA: Diagnosis not present

## 2023-10-23 DIAGNOSIS — M79674 Pain in right toe(s): Secondary | ICD-10-CM | POA: Diagnosis not present

## 2023-10-23 DIAGNOSIS — M79675 Pain in left toe(s): Secondary | ICD-10-CM | POA: Diagnosis not present

## 2023-10-23 DIAGNOSIS — I739 Peripheral vascular disease, unspecified: Secondary | ICD-10-CM | POA: Diagnosis not present

## 2023-10-23 DIAGNOSIS — L84 Corns and callosities: Secondary | ICD-10-CM

## 2023-10-28 NOTE — Progress Notes (Addendum)
  Subjective:  Patient ID: Seth Santana, male    DOB: February 07, 1951,  MRN: [id number]  SAIR FAULCON presents to clinic today for at risk foot care. Patient has h/o PAD and painful mycotic toenails of both feet that are difficult to trim. Pain interferes with daily activities and wearing enclosed shoe gear comfortably.  Chief Complaint  Patient presents with   RFC    RFC Non diabetic toenail trim. A1C 6.8.   New problem(s): None.   PCP is Renne Homans, MD. ARNETTA 06/07/2023.  No Known Allergies  Review of Systems: Negative except as noted in the HPI.  Objective: No changes noted in today's physical examination. There were no vitals filed for this visit. Seth Santana is a pleasant 73 y.o. male WD, WN in NAD. AAO x 3.  Vascular Examination: CFT <4 seconds b/l. DP pulses diminished b/l. PT pulses diminished b/l. Digital hair absent. Skin temperature gradient warm to cool b/l. No ischemia or gangrene. No cyanosis or clubbing noted b/l. Evidence of skin changes consistent with long term venous stasis BLE.   Neurological Examination: Sensation grossly intact b/l with 10 gram monofilament. Vibratory sensation intact b/l.   Dermatological Examination: Pedal skin thin, shiny and atrophic b/l. No open wounds. No interdigital macerations.   Toenails 1-5 b/l thick, discolored, elongated with subungual debris and pain on dorsal palpation.   Hyperkeratotic lesion(s) plantar IPJ of right great toe.  No erythema, no edema, no drainage, no fluctuance. Hyperpigmentation consistent with findings of chronic venous insufficiency is present b/l lower extremities.  Musculoskeletal Examination: Muscle strength 5/5 to all lower extremity muscle groups bilaterally. Pes planus deformity noted bilateral LE.SABRA No pain, crepitus or joint limitation noted with ROM b/l LE.  Patient ambulates independently without assistive aids.  Radiographs: None  Last A1c:      Latest Ref Rng & Units 06/07/2023     1:48 PM  Hemoglobin A1C  Hemoglobin-A1c 4.0 - 5.6 % 6.8    Assessment/Plan: 1. Pain due to onychomycosis of toenails of both feet   2. Callus   3. PAD (peripheral artery disease) (HCC)   Consent given for treatment. Patient examined. All patient's and/or POA's questions/concerns addressed on today's visit.Toenails 1-5 debrided in length and girth without incident. Callus(es) medial IPJ of right great toe pared with sharp debridement without incident. Continue soft, supportive shoe gear daily. Report any pedal injuries to medical professional. Call office if there are any questions/concerns. Treatment was provided by assistant Andrez Manchester under my supervision.  Return in about 3 months (around 01/23/2024).  Delon LITTIE Merlin, DPM      [place] LOCATION: [address], [place] [address]                   Office [phone]   [facility] LOCATION: [address] [place], [place] [address] Office [phone]

## 2023-11-23 ENCOUNTER — Ambulatory Visit: Payer: Self-pay

## 2023-11-23 ENCOUNTER — Ambulatory Visit

## 2023-11-23 ENCOUNTER — Other Ambulatory Visit: Payer: Self-pay

## 2023-11-23 VITALS — BP 129/64 | HR 69 | Temp 98.3°F | Ht 69.0 in | Wt 212.4 lb

## 2023-11-23 DIAGNOSIS — E1122 Type 2 diabetes mellitus with diabetic chronic kidney disease: Secondary | ICD-10-CM | POA: Diagnosis not present

## 2023-11-23 DIAGNOSIS — I10 Essential (primary) hypertension: Secondary | ICD-10-CM

## 2023-11-23 DIAGNOSIS — N1832 Chronic kidney disease, stage 3b: Secondary | ICD-10-CM | POA: Diagnosis not present

## 2023-11-23 DIAGNOSIS — Z7984 Long term (current) use of oral hypoglycemic drugs: Secondary | ICD-10-CM | POA: Diagnosis not present

## 2023-11-23 DIAGNOSIS — I129 Hypertensive chronic kidney disease with stage 1 through stage 4 chronic kidney disease, or unspecified chronic kidney disease: Secondary | ICD-10-CM | POA: Diagnosis not present

## 2023-11-23 DIAGNOSIS — M109 Gout, unspecified: Secondary | ICD-10-CM

## 2023-11-23 DIAGNOSIS — M10271 Drug-induced gout, right ankle and foot: Secondary | ICD-10-CM | POA: Diagnosis not present

## 2023-11-23 DIAGNOSIS — R7303 Prediabetes: Secondary | ICD-10-CM

## 2023-11-23 LAB — GLUCOSE, CAPILLARY: Glucose-Capillary: 133 mg/dL — ABNORMAL HIGH (ref 70–99)

## 2023-11-23 LAB — POCT GLYCOSYLATED HEMOGLOBIN (HGB A1C): Hemoglobin A1C: 6.9 % — AB (ref 4.0–5.6)

## 2023-11-23 MED ORDER — PREDNISONE 20 MG PO TABS: 40.0000 mg | ORAL_TABLET | Freq: Every day | ORAL | 0 refills | Status: AC

## 2023-11-23 NOTE — Assessment & Plan Note (Signed)
 3 days ago, the patient noticed that he was starting to have bilateral foot pain and swelling.  His symptoms began without inciting incident or injury.  His pain has kept him from walking is much as he normally does.  He says that his pain alternates between his right and left foot but today is worse on his right great toe. Normally he walks around 1 to 2 miles per day.  He does drink alcohol  about every day.  He currently takes furosemide  80 mg/day.  He had a similar presentation to this in February of this year, and was prescribed prednisone  for assumed acute gout flare.  He does not remember whether this medicine helped.  On physical exam there is significant tenderness of the right MTP joint along with swelling.  Difficult to assess erythema in this patient. According to the acute gout diagnosis rule, this patient scored 8 points indicating an 82.5% prevalence of gout, even prior to uric acid study.  Etiology of his gout is likely multifactorial in the setting of alcohol  use, age, CKD, and diuretic use.  We will treat his acute flare today, check a uric acid level, and consider starting allopurinol once his symptoms have subsided if his uric acid level is elevated.  -Check uric acid level -Prescribe prednisone  40mg  once daily for 5 days. - If uric acid elevated start allopurinol 50 mg once a day given his creatinine clearance. Will start after this acute flare has passed. - Discussed avoidance of alcohol 

## 2023-11-23 NOTE — Assessment & Plan Note (Addendum)
 Previous GFR 35 in March last year.  Is seen by a nephrologist outside of [facility] system.  He is currently taking Jardiance  as prescribed by his neprologist.  - BMP today -Microalbumin to creatinine ratio given new diagnosis of diabetes.

## 2023-11-23 NOTE — Telephone Encounter (Signed)
 FYI Only or Action Required?: FYI only for provider.  Patient was last seen in primary care on 06/07/2023 by Renne Homans, MD.  Called Nurse Triage reporting Foot Swelling.  Symptoms began several days ago.  Interventions attempted: Nothing.  Symptoms are: gradually worsening.  Triage Disposition: See Physician Within 24 Hours  Patient/caregiver understands and will follow disposition?: Yes                     Copied from CRM #[id number]. Topic: Clinical - Red Word Triage >> Nov 23, 2023  7:38 AM Carrielelia G wrote: Red Word that prompted transfer to Nurse Triage: both feet swollen, pain is a 10. Difficulty walking. Reason for Disposition  [1] MODERATE leg swelling (e.g., swelling extends up to knees) AND [2] new-onset or getting worse  Answer Assessment - Initial Assessment Questions 1. ONSET: When did the swelling start? (e.g., minutes, hours, days)     2 days 2. LOCATION: What part of the leg is swollen?  Are both legs swollen or just one leg?     Both feet 3. SEVERITY: How bad is the swelling? (e.g., localized; mild, moderate, severe)     Moderate - severe 4. REDNESS: Is there redness or signs of infection?     Cannot tell if they are red.  5. PAIN: Is the swelling painful to touch? If Yes, ask: How painful is it?   (Scale 1-10; mild, moderate or severe)     10/10 6. FEVER: Do you have a fever? If Yes, ask: What is it, how was it measured, and when did it start?      Maybe a little warm 7. CAUSE: What do you think is causing the leg swelling?     unsure 8. MEDICAL HISTORY: Do you have a history of blood clots (e.g., DVT), cancer, heart failure, kidney disease, or liver failure?     Kidney, heart failure 9. RECURRENT SYMPTOM: Have you had leg swelling before? If Yes, ask: When was the last time? What happened that time?     yes 10. OTHER SYMPTOMS: Do you have any other symptoms? (e.g., chest pain, difficulty breathing)        no  Protocols used: Leg Swelling and Edema-A-AH

## 2023-11-23 NOTE — Assessment & Plan Note (Addendum)
 Lab Results  Component Value Date   HGBA1C 6.9 (A) 11/23/2023   HGBA1C 6.8 (A) 06/07/2023   HGBA1C 5.7 (A) 03/22/2022   A1c today 6.9 stable from 6.8 in February.  We discussed potential lifestyle modifications including decreasing sugary drinks and alcohol  and the patient consumes, increase amount of exercise, eating more whole foods. Unable to take Metformin due to his kidney disease.  If A1c not improved at next visit, would consider starting a GLP-1 or increasing Jardiance  to 20 mg day.  -Currently on Jardiance  10mg  -follow-up in 3 months -could consider GLP-1 or increasing Jardiance  if not improved in 3 months.

## 2023-11-23 NOTE — Telephone Encounter (Signed)
 Pt has arrived for his appt this am.

## 2023-11-23 NOTE — Assessment & Plan Note (Signed)
 BP today well-controlled at 129/64.  Current regimen includes carvedilol  12.5 mg twice a day, hydralazine  50 mg twice a day and Lasix  80 mg once a day.  - Continue current regimen.

## 2023-11-23 NOTE — Patient Instructions (Signed)
 Thank you, Mr.Kennis GORMAN Needles, for allowing us  to provide your care today. Today we discussed . . .  > Gout attack       - I think that your foot pain and swelling is probably coming from gout. We will start you on a medicine called prednisone  for 5 days to help with your gout attack. I will call you back with the results of your labs and we will talk about starting a medicine called allopurinol if appropriate. > Diabetes       - Your A1C today was 6.9. Work on limiting the amount of sugary drinks and alcohol  that you consume. I have also started you on a medication called Metformin to help lower your blood sugar   I have ordered the following labs for you:   Lab Orders         Glucose, capillary         Microalbumin / Creatinine Urine Ratio         Uric acid         Basic metabolic panel with GFR         POC Hbg A1C       Referrals ordered today:   Referral Orders  No referral(s) requested today      Follow up: 3 months    Remember:  Should you have any questions or concerns please call the internal medicine clinic at [phone].     Melvenia Morrison, [facility]

## 2023-11-23 NOTE — Progress Notes (Signed)
 CC: Acute Concern of foot swelling and pain  HPI:  Seth Santana is a 73 y.o. male with pertinent PMH of HTN, prediabetes, diastolic heart failure, and CKD who presents for an acute concern of foot swelling and pain. Please see problem based assessment and plan for further history.  Review of Systems  Constitutional:  Negative for fever.  HENT:  Positive for hearing loss.   Eyes:  Negative for blurred vision.  Respiratory:  Negative for shortness of breath.   Cardiovascular:  Negative for chest pain.  Gastrointestinal:  Positive for constipation. Negative for abdominal pain.    Medications: Current Outpatient Medications  Medication Instructions   acetaminophen  (TYLENOL ) 500 mg, Every 6 hours PRN   aspirin  EC 81 mg, Oral, Daily, Swallow whole.   augmented betamethasone dipropionate (DIPROLENE-AF) 0.05 % cream As needed   carvedilol  (COREG ) 12.5 mg, Oral, 2 times daily with meals   CLENPIQ 10-3.5-12 MG-GM -GM/175ML SOLN 175 mL orally twice for 2   Cyanocobalamin  (B-12 PO) 1 tablet, Daily PRN   ezetimibe  (ZETIA ) 10 mg, Oral, Daily   ferrous sulfate  325 mg, Oral, 2 times daily   FIBER PO 1 tablet, Daily PRN   finasteride  (PROSCAR ) 5 mg, Oral, Daily   furosemide  (LASIX ) 80 mg, Oral, Every morning   gabapentin  (NEURONTIN ) 300 mg, Oral, 2 times daily   hydrALAZINE  (APRESOLINE ) 50 MG tablet TAKE 1 TABLET BY MOUTH IN THE  MORNING AND AT BEDTIME   hydrOXYzine  (ATARAX ) 10 MG tablet TAKE 1 TABLET BY MOUTH EVERY 8  HOURS AS NEEDED FOR ITCHING   icosapent  Ethyl (VASCEPA ) 2 g, Oral, 2 times daily   Jardiance  10 mg, Oral, Daily before breakfast   ketoconazole  (NIZORAL ) 2 % cream Apply to both feet and between toes once daily for 6 weeks.   losartan  (COZAAR ) 100 mg, Oral, Daily   Multiple Vitamins-Minerals (MENS 50+ MULTIVITAMIN) TABS 1 tablet, Daily   nicotine  (NICODERM CQ ) 14 mg, Transdermal, Daily   nicotine  (NICODERM CQ ) 7 mg, Transdermal, Daily   omeprazole  (PRILOSEC) 40 mg, Oral,  Daily   rosuvastatin  (CRESTOR ) 40 mg, Oral, Daily   SPIRIVA  RESPIMAT 2.5 MCG/ACT AERS USE 2 INHALATIONS BY MOUTH ONCE  DAILY   triamcinolone  ointment (KENALOG ) 0.1 % APPLY TOPICALLY TO AFFECTED  AREA(S) TWICE DAILY AS NEEDED  FOR ITCHING     Physical Exam:  There were no vitals filed for this visit.  Physical Exam Constitutional:      General: He is not in acute distress.    Appearance: He is not ill-appearing.  Cardiovascular:     Rate and Rhythm: Normal rate and regular rhythm.     Heart sounds: No murmur heard.    No friction rub. No gallop.  Pulmonary:     Effort: No respiratory distress.     Breath sounds: Normal breath sounds. No wheezing, rhonchi or rales.  Musculoskeletal:        General: Swelling present.     Right lower leg: No edema.     Left lower leg: No edema.     Comments: Swelling of the bilateral ankles and feet.  Extreme tenderness to palpation over the right MTP joint.  Mildly tender to palpation over bilateral ankles.  Nonpitting edema of the bilateral lower extremities.  Nodule present in the back of the left calf, present on previous exams.  Bilateral feet mildly warm.  Neurological:     Mental Status: He is alert.       Assessment & Plan:  Assessment & Plan Acute gout involving toe of right foot, unspecified cause 3 days ago, the patient noticed that he was starting to have bilateral foot pain and swelling.  His symptoms began without inciting incident or injury.  His pain has kept him from walking is much as he normally does.  He says that his pain alternates between his right and left foot but today is worse on his right great toe. Normally he walks around 1 to 2 miles per day.  He does drink alcohol  about every day.  He currently takes furosemide  80 mg/day.  He had a similar presentation to this in February of this year, and was prescribed prednisone  for assumed acute gout flare.  He does not remember whether this medicine helped.  On physical exam  there is significant tenderness of the right MTP joint along with swelling.  Difficult to assess erythema in this patient. According to the acute gout diagnosis rule, this patient scored 8 points indicating an 82.5% prevalence of gout, even prior to uric acid study.  Etiology of his gout is likely multifactorial in the setting of alcohol  use, age, CKD, and diuretic use.  We will treat his acute flare today, check a uric acid level, and consider starting allopurinol once his symptoms have subsided if his uric acid level is elevated.  -Check uric acid level -Prescribe prednisone  40mg  once daily for 5 days. - If uric acid elevated start allopurinol 50 mg once a day given his creatinine clearance. Will start after this acute flare has passed. - Discussed avoidance of alcohol  Chronic kidney disease, stage 3b (HCC) Previous GFR 35 in March last year.  Is seen by a nephrologist outside of [facility] system.  He is currently taking Jardiance  as prescribed by his neprologist.  - BMP today -Microalbumin to creatinine ratio given new diagnosis of diabetes. Primary hypertension BP today well-controlled at 129/64.  Current regimen includes carvedilol  12.5 mg twice a day, hydralazine  50 mg twice a day and Lasix  80 mg once a day.  - Continue current regimen. Type 2 diabetes mellitus with stage 3b chronic kidney disease, without long-term current use of insulin (HCC) Lab Results  Component Value Date   HGBA1C 6.9 (A) 11/23/2023   HGBA1C 6.8 (A) 06/07/2023   HGBA1C 5.7 (A) 03/22/2022   A1c today 6.9 stable from 6.8 in February.  We discussed potential lifestyle modifications including decreasing sugary drinks and alcohol  and the patient consumes, increase amount of exercise, eating more whole foods. Unable to take Metformin due to his kidney disease.  If A1c not improved at next visit, would consider starting a GLP-1 or increasing Jardiance  to 20 mg day.  -Currently on Jardiance  10mg  -follow-up in 3 months -could  consider GLP-1 or increasing Jardiance  if not improved in 3 months.  No orders of the defined types were placed in this encounter.   Patient seen with Dr. Layman Jeanelle Melvenia Napoleon, MD [facility] Internal Medicine Resident PGY-1 Clinic Phone: [phone] Please contact the on call pager at [phone] for any urgent or emergent needs.

## 2023-11-24 LAB — BASIC METABOLIC PANEL WITH GFR
BUN/Creatinine Ratio: 13 (ref 10–24)
BUN: 41 mg/dL — ABNORMAL HIGH (ref 8–27)
CO2: 19 mmol/L — ABNORMAL LOW (ref 20–29)
Calcium: 10.1 mg/dL (ref 8.6–10.2)
Chloride: 105 mmol/L (ref 96–106)
Creatinine, Ser: 3.09 mg/dL — ABNORMAL HIGH (ref 0.76–1.27)
Glucose: 113 mg/dL — ABNORMAL HIGH (ref 70–99)
Potassium: 4.4 mmol/L (ref 3.5–5.2)
Sodium: 141 mmol/L (ref 134–144)
eGFR: 21 mL/min/1.73 — ABNORMAL LOW (ref >59)

## 2023-11-24 LAB — URIC ACID: Uric Acid: 10.4 mg/dL — ABNORMAL HIGH (ref 3.8–8.4)

## 2023-11-25 NOTE — Progress Notes (Signed)
 Internal Medicine Clinic Attending  I was physically present during the key portions of the resident provided service and participated in the medical decision making of patient's management care. I reviewed pertinent patient test results.  The assessment, diagnosis, and plan were formulated together and I agree with the documentation in the resident's note.  Jeanelle Layman CROME, MD

## 2023-11-27 ENCOUNTER — Ambulatory Visit: Payer: Self-pay

## 2023-11-27 DIAGNOSIS — N1832 Chronic kidney disease, stage 3b: Secondary | ICD-10-CM

## 2023-11-27 DIAGNOSIS — M109 Gout, unspecified: Secondary | ICD-10-CM

## 2023-11-27 LAB — MICROALBUMIN / CREATININE URINE RATIO
Creatinine, Urine: 147.6 mg/dL
Microalb/Creat Ratio: 88 mg/g{creat} — ABNORMAL HIGH (ref 0–29)
Microalbumin, Urine: 130 ug/mL

## 2023-12-03 ENCOUNTER — Other Ambulatory Visit: Payer: Self-pay | Admitting: *Deleted

## 2023-12-03 MED ORDER — CARVEDILOL 12.5 MG PO TABS: 12.5000 mg | ORAL_TABLET | Freq: Two times a day (BID) | ORAL | 2 refills | Status: DC

## 2023-12-12 ENCOUNTER — Encounter (HOSPITAL_COMMUNITY): Payer: Self-pay

## 2023-12-12 ENCOUNTER — Other Ambulatory Visit: Payer: Self-pay

## 2023-12-12 ENCOUNTER — Emergency Department (HOSPITAL_COMMUNITY)

## 2023-12-12 ENCOUNTER — Ambulatory Visit: Payer: Self-pay

## 2023-12-12 ENCOUNTER — Inpatient Hospital Stay (HOSPITAL_COMMUNITY)
Admission: EM | Admit: 2023-12-12 | Discharge: 2023-12-14 | DRG: 603 | Disposition: A | Discharge status: Home / Self Care | Attending: Infectious Diseases | Admitting: Infectious Diseases

## 2023-12-12 DIAGNOSIS — K219 Gastro-esophageal reflux disease without esophagitis: Secondary | ICD-10-CM | POA: Diagnosis present

## 2023-12-12 DIAGNOSIS — N1832 Chronic kidney disease, stage 3b: Secondary | ICD-10-CM | POA: Diagnosis not present

## 2023-12-12 DIAGNOSIS — R291 Meningismus: Secondary | ICD-10-CM | POA: Diagnosis present

## 2023-12-12 DIAGNOSIS — L089 Local infection of the skin and subcutaneous tissue, unspecified: Secondary | ICD-10-CM | POA: Diagnosis not present

## 2023-12-12 DIAGNOSIS — Z841 Family history of disorders of kidney and ureter: Secondary | ICD-10-CM | POA: Diagnosis not present

## 2023-12-12 DIAGNOSIS — Z560 Unemployment, unspecified: Secondary | ICD-10-CM

## 2023-12-12 DIAGNOSIS — G8929 Other chronic pain: Secondary | ICD-10-CM | POA: Diagnosis not present

## 2023-12-12 DIAGNOSIS — B370 Candidal stomatitis: Secondary | ICD-10-CM | POA: Diagnosis not present

## 2023-12-12 DIAGNOSIS — Z7984 Long term (current) use of oral hypoglycemic drugs: Secondary | ICD-10-CM | POA: Diagnosis not present

## 2023-12-12 DIAGNOSIS — R519 Headache, unspecified: Secondary | ICD-10-CM | POA: Diagnosis not present

## 2023-12-12 DIAGNOSIS — J449 Chronic obstructive pulmonary disease, unspecified: Secondary | ICD-10-CM | POA: Diagnosis present

## 2023-12-12 DIAGNOSIS — E1140 Type 2 diabetes mellitus with diabetic neuropathy, unspecified: Secondary | ICD-10-CM | POA: Diagnosis not present

## 2023-12-12 DIAGNOSIS — M109 Gout, unspecified: Secondary | ICD-10-CM | POA: Diagnosis present

## 2023-12-12 DIAGNOSIS — M542 Cervicalgia: Secondary | ICD-10-CM | POA: Diagnosis not present

## 2023-12-12 DIAGNOSIS — Z79899 Other long term (current) drug therapy: Secondary | ICD-10-CM | POA: Diagnosis not present

## 2023-12-12 DIAGNOSIS — N179 Acute kidney failure, unspecified: Secondary | ICD-10-CM | POA: Diagnosis present

## 2023-12-12 DIAGNOSIS — Z7982 Long term (current) use of aspirin: Secondary | ICD-10-CM

## 2023-12-12 DIAGNOSIS — I5032 Chronic diastolic (congestive) heart failure: Secondary | ICD-10-CM | POA: Diagnosis present

## 2023-12-12 DIAGNOSIS — N390 Urinary tract infection, site not specified: Secondary | ICD-10-CM | POA: Diagnosis present

## 2023-12-12 DIAGNOSIS — E781 Pure hyperglyceridemia: Secondary | ICD-10-CM | POA: Diagnosis present

## 2023-12-12 DIAGNOSIS — R627 Adult failure to thrive: Secondary | ICD-10-CM | POA: Diagnosis not present

## 2023-12-12 DIAGNOSIS — F17290 Nicotine dependence, other tobacco product, uncomplicated: Secondary | ICD-10-CM | POA: Diagnosis present

## 2023-12-12 DIAGNOSIS — E1122 Type 2 diabetes mellitus with diabetic chronic kidney disease: Secondary | ICD-10-CM | POA: Diagnosis present

## 2023-12-12 DIAGNOSIS — H9190 Unspecified hearing loss, unspecified ear: Secondary | ICD-10-CM | POA: Diagnosis present

## 2023-12-12 DIAGNOSIS — N400 Enlarged prostate without lower urinary tract symptoms: Secondary | ICD-10-CM | POA: Diagnosis present

## 2023-12-12 DIAGNOSIS — R1312 Dysphagia, oropharyngeal phase: Secondary | ICD-10-CM | POA: Diagnosis not present

## 2023-12-12 DIAGNOSIS — R509 Fever, unspecified: Secondary | ICD-10-CM | POA: Diagnosis not present

## 2023-12-12 DIAGNOSIS — I517 Cardiomegaly: Secondary | ICD-10-CM | POA: Diagnosis not present

## 2023-12-12 DIAGNOSIS — M4802 Spinal stenosis, cervical region: Secondary | ICD-10-CM | POA: Diagnosis not present

## 2023-12-12 DIAGNOSIS — Z833 Family history of diabetes mellitus: Secondary | ICD-10-CM

## 2023-12-12 DIAGNOSIS — R1310 Dysphagia, unspecified: Secondary | ICD-10-CM | POA: Diagnosis not present

## 2023-12-12 DIAGNOSIS — I130 Hypertensive heart and chronic kidney disease with heart failure and stage 1 through stage 4 chronic kidney disease, or unspecified chronic kidney disease: Secondary | ICD-10-CM | POA: Diagnosis present

## 2023-12-12 DIAGNOSIS — M50221 Other cervical disc displacement at C4-C5 level: Secondary | ICD-10-CM | POA: Diagnosis not present

## 2023-12-12 DIAGNOSIS — Z1152 Encounter for screening for COVID-19: Secondary | ICD-10-CM

## 2023-12-12 DIAGNOSIS — M47812 Spondylosis without myelopathy or radiculopathy, cervical region: Secondary | ICD-10-CM | POA: Diagnosis not present

## 2023-12-12 LAB — CBC WITH DIFFERENTIAL/PLATELET
Abs Immature Granulocytes: 0.03 K/uL (ref 0.00–0.07)
Basophils Absolute: 0.1 K/uL (ref 0.0–0.1)
Basophils Relative: 1 %
Eosinophils Absolute: 0.1 K/uL (ref 0.0–0.5)
Eosinophils Relative: 1 %
HCT: 34.2 % — ABNORMAL LOW (ref 39.0–52.0)
Hemoglobin: 10.7 g/dL — ABNORMAL LOW (ref 13.0–17.0)
Immature Granulocytes: 0 %
Lymphocytes Relative: 19 %
Lymphs Abs: 1.9 K/uL (ref 0.7–4.0)
MCH: 28.9 pg (ref 26.0–34.0)
MCHC: 31.3 g/dL (ref 30.0–36.0)
MCV: 92.4 fL (ref 80.0–100.0)
Monocytes Absolute: 1.3 K/uL — ABNORMAL HIGH (ref 0.1–1.0)
Monocytes Relative: 13 %
Neutro Abs: 6.5 K/uL (ref 1.7–7.7)
Neutrophils Relative %: 66 %
Platelets: 212 K/uL (ref 150–400)
RBC: 3.7 MIL/uL — ABNORMAL LOW (ref 4.22–5.81)
RDW: 14.3 % (ref 11.5–15.5)
WBC: 9.8 K/uL (ref 4.0–10.5)
nRBC: 0 % (ref 0.0–0.2)

## 2023-12-12 LAB — COMPREHENSIVE METABOLIC PANEL WITH GFR
ALT: 16 U/L (ref 0–44)
AST: 17 U/L (ref 15–41)
Albumin: 3.7 g/dL (ref 3.5–5.0)
Alkaline Phosphatase: 60 U/L (ref 38–126)
Anion gap: 14 (ref 5–15)
BUN: 24 mg/dL — ABNORMAL HIGH (ref 8–23)
CO2: 22 mmol/L (ref 22–32)
Calcium: 10 mg/dL (ref 8.9–10.3)
Chloride: 107 mmol/L (ref 98–111)
Creatinine, Ser: 2.23 mg/dL — ABNORMAL HIGH (ref 0.61–1.24)
GFR, Estimated: 31 mL/min — ABNORMAL LOW (ref >60)
Glucose, Bld: 113 mg/dL — ABNORMAL HIGH (ref 70–99)
Potassium: 4.5 mmol/L (ref 3.5–5.1)
Sodium: 143 mmol/L (ref 135–145)
Total Bilirubin: 0.8 mg/dL (ref 0.0–1.2)
Total Protein: 7.6 g/dL (ref 6.5–8.1)

## 2023-12-12 LAB — GROUP A STREP BY PCR: Group A Strep by PCR: NOT DETECTED

## 2023-12-12 LAB — I-STAT CHEM 8, ED
BUN: 24 mg/dL — ABNORMAL HIGH (ref 8–23)
Calcium, Ion: 1.26 mmol/L (ref 1.15–1.40)
Chloride: 109 mmol/L (ref 98–111)
Creatinine, Ser: 2.4 mg/dL — ABNORMAL HIGH (ref 0.61–1.24)
Glucose, Bld: 113 mg/dL — ABNORMAL HIGH (ref 70–99)
HCT: 33 % — ABNORMAL LOW (ref 39.0–52.0)
Hemoglobin: 11.2 g/dL — ABNORMAL LOW (ref 13.0–17.0)
Potassium: 4.5 mmol/L (ref 3.5–5.1)
Sodium: 142 mmol/L (ref 135–145)
TCO2: 23 mmol/L (ref 22–32)

## 2023-12-12 LAB — URINALYSIS, W/ REFLEX TO CULTURE (INFECTION SUSPECTED)
Bacteria, UA: NONE SEEN
Bilirubin Urine: NEGATIVE
Glucose, UA: >=500 mg/dL — AB
Hgb urine dipstick: NEGATIVE
Ketones, ur: NEGATIVE mg/dL
Nitrite: NEGATIVE
Protein, ur: >=300 mg/dL — AB
Specific Gravity, Urine: 1.018 (ref 1.005–1.030)
WBC, UA: >50 WBC/hpf (ref 0–5)
pH: 5 (ref 5.0–8.0)

## 2023-12-12 LAB — I-STAT CG4 LACTIC ACID, ED: Lactic Acid, Venous: 0.4 mmol/L — ABNORMAL LOW (ref 0.5–1.9)

## 2023-12-12 LAB — RESP PANEL BY RT-PCR (RSV, FLU A&B, COVID)  RVPGX2
Influenza A by PCR: NEGATIVE
Influenza B by PCR: NEGATIVE
Resp Syncytial Virus by PCR: NEGATIVE
SARS Coronavirus 2 by RT PCR: NEGATIVE

## 2023-12-12 MED ORDER — FENTANYL CITRATE PF 50 MCG/ML IJ SOSY
50.0000 ug | PREFILLED_SYRINGE | Freq: Once | INTRAMUSCULAR | Status: AC
  Administered 2023-12-12: 50 ug via INTRAVENOUS
  Filled 2023-12-12: qty 1

## 2023-12-12 NOTE — Telephone Encounter (Signed)
 FYI Only or Action Required?: FYI only for provider.  Patient was last seen in primary care on 11/23/2023 by Napoleon Limes, MD.  Called Nurse Triage reporting Neck Pain.  Symptoms began several days ago.  Interventions attempted: Rest, hydration, or home remedies.  Symptoms are: gradually worsening.  Triage Disposition: Go to ED Now (Notify PCP)  Patient/caregiver understands and will follow disposition?: YesCopied from CRM #[id number]. Topic: Clinical - Red Word Triage >> Dec 12, 2023  2:36 PM Suzette B wrote: [facility] that prompted transfer to Nurse Triage: crook in the next, chills/fever 100. Something, no appetite, throat hurts when he tries to swallow, bottom lip swollen, sleeping continuously Daughter is the caller Mady Needles  ----------------------------------------------------------------------- From previous Reason for Contact - Scheduling: Patient/patient representative is calling to schedule an appointment. Refer to attachments for appointment information. Reason for Disposition  [1] Stiff neck (can't put chin to chest) AND [2] fever  Answer Assessment - Initial Assessment Questions RN advised ED. Daughter is going to take him. Declined 911.      1. ONSET: When did the pain begin?      Friday 2. LOCATION: Where does it hurt?      Whole neck 3. PATTERN Does the pain come and go, or has it been constant since it started?      Constant  4. SEVERITY: How bad is the pain?  (Scale 0-10; or none or slight stiffness, mild, moderate, severe)     Severe 5. RADIATION: Does the pain go anywhere else, shoot into your arms?     denies 6. CORD SYMPTOMS: Any weakness or numbness of the arms or legs?     denies 7. CAUSE: What do you think is causing the neck pain?     Not sure  8. NECK OVERUSE: Any recent activities that involved turning or twisting the neck?     denies 9. OTHER SYMPTOMS: Do you have any other symptoms? (e.g., headache, fever, chest  pain, difficulty breathing, neck swelling)     Fever, body aches, not eating/drinking, sore throat, chills  Protocols used: Neck Pain or Stiffness-A-AH

## 2023-12-12 NOTE — ED Provider Triage Note (Signed)
 Emergency Medicine Provider Triage Evaluation Note  KAEMON BARNETT , a 73 y.o. male  was evaluated in triage.  Pt complains of increasing neck pain and stiffness over the last 5 days, further endorses productive cough, purulent sputum.  Also endorses sore throat and difficulty swallowing.  Secondary to the difficulty swallowing, family states that patient has had decreased compliance with his medications and they are concerned that he has not had his regular medications for the last 2 days.  Review of Systems  Positive: As above Negative:   Physical Exam  BP (!) 155/81 (BP Location: Right Arm)   Pulse 73   Temp 99.6 F (37.6 C)   Resp (!) 21   SpO2 100%  Gen:   Awake, no distress   Resp:  Normal effort  MSK:   Moves extremities without difficulty, though has painful rotation and flexion extension of the neck, he does have pain with palpation to the right side of the neck along the distribution of the trapezius. Other:  Posterior oropharynx is erythematous however no tonsillar edema or exudates are appreciated.  Medical Decision Making  Medically screening exam initiated at 6:22 PM.  Appropriate orders placed.  BROOX LONIGRO was informed that the remainder of the evaluation will be completed by another provider, this initial triage assessment does not replace that evaluation, and the importance of remaining in the ED until their evaluation is complete.  Basic lab evaluation as well as COVID/flu/RSV as well as swabs for strep secondary to sore throat.   Myriam Dorn BROCKS, [place] 12/12/23 [phone]

## 2023-12-12 NOTE — ED Triage Notes (Signed)
 Patient family reports neck pain and stiffness since Friday, decreased since appetite since Monday, reports it hurts when he drinks with a straw, fever today 100.7.

## 2023-12-12 NOTE — ED Provider Notes (Signed)
 [place] EMERGENCY DEPARTMENT AT [facility] Provider Note   CSN: [id number] Arrival date & time: 12/12/23  8397     Patient presents with: Failure To Thrive, Weakness, and Fever   ZAIVION KUNDRAT is a 73 y.o. male.  {Add pertinent medical, surgical, social history, OB history to YEP:67052} The history is provided by the patient and a relative.  Weakness Associated symptoms: fever   Fever DANNIEL TONES is a 73 y.o. male who presents to the Emergency Department complaining of *** Neck pain, right side on Friday and shoulder.  Stiff Decreased oral intake for two days, hurts to swallow Initially thought due to sleeping wrong. Always cold.  T 100.7 today.  No vomiting.  Has cough productive of mucous.  Constipation Foot swelling - saw pcp and swelling got better. No confusion, falls.      Prior to Admission medications   Medication Sig Start Date End Date Taking? Authorizing Provider  acetaminophen  (TYLENOL ) 500 MG tablet Take 500 mg by mouth every 6 (six) hours as needed for moderate pain.    [provider]  aspirin  EC 81 MG tablet Take 1 tablet (81 mg total) by mouth daily. Swallow whole. 08/22/22   Darron Deatrice LABOR, MD  augmented betamethasone dipropionate (DIPROLENE-AF) 0.05 % cream as needed (dry skin). 04/06/22   [provider]  carvedilol  (COREG ) 12.5 MG tablet Take 1 tablet (12.5 mg total) by mouth 2 (two) times daily with a meal. 12/03/23   Renne Homans, MD  CLENPIQ 10-3.5-12 MG-GM -GM/175ML SOLN 175 mL orally twice for 2 03/27/23   [provider]  Cyanocobalamin  (B-12 PO) Take 1 tablet by mouth daily as needed (energy).    [provider]  ezetimibe  (ZETIA ) 10 MG tablet TAKE 1 TABLET BY MOUTH DAILY 05/31/23   Chandrasekhar, Mahesh A, MD  ferrous sulfate  325 (65 FE) MG EC tablet Take 1 tablet (325 mg total) by mouth 2 (two) times daily. 05/03/22 10/23/23  Atway, Rayann N, DO  FIBER PO Take 1 tablet by mouth daily as needed  (constipation).    [provider]  finasteride  (PROSCAR ) 5 MG tablet TAKE 1 TABLET BY MOUTH DAILY 04/26/23   Atway, Rayann N, DO  furosemide  (LASIX ) 80 MG tablet TAKE 1 TABLET BY MOUTH DAILY IN  THE MORNING 06/01/23   Chandrasekhar, Mahesh A, MD  gabapentin  (NEURONTIN ) 300 MG capsule TAKE 1 CAPSULE BY MOUTH TWICE  DAILY 09/04/23   Atway, Rayann N, DO  hydrALAZINE  (APRESOLINE ) 50 MG tablet TAKE 1 TABLET BY MOUTH IN THE  MORNING AND AT BEDTIME 06/18/23   Atway, Rayann N, DO  hydrOXYzine  (ATARAX ) 10 MG tablet TAKE 1 TABLET BY MOUTH EVERY 8  HOURS AS NEEDED FOR ITCHING 10/31/22   Atway, Rayann N, DO  JARDIANCE  10 MG TABS tablet TAKE 1 TABLET BY MOUTH DAILY  BEFORE BREAKFAST 08/17/23   Atway, Rayann N, DO  ketoconazole  (NIZORAL ) 2 % cream Apply to both feet and between toes once daily for 6 weeks. Patient taking differently: as needed for irritation. Apply to both feet and between toes once daily for 6 weeks. 11/07/22   Gaynel Delon CROME, DPM  losartan  (COZAAR ) 100 MG tablet TAKE 1 TABLET BY MOUTH DAILY 04/02/23   Atway, Rayann N, DO  Multiple Vitamins-Minerals (MENS 50+ MULTIVITAMIN) TABS Take 1 tablet by mouth daily.    [provider]  nicotine  (NICODERM CQ ) 14 mg/24hr patch Place 1 patch (14 mg total) onto the skin daily. 10/16/22  Wyn Jackee VEAR Mickey., NP  nicotine  (NICODERM CQ ) 7 mg/24hr patch Place 1 patch (7 mg total) onto the skin daily. 10/16/22   Wyn Jackee VEAR Mickey., NP  omeprazole  (PRILOSEC) 40 MG capsule TAKE 1 CAPSULE BY MOUTH DAILY 04/26/23   Atway, Rayann N, DO  rosuvastatin  (CRESTOR ) 40 MG tablet TAKE 1 TABLET BY MOUTH DAILY 08/10/23   Atway, Rayann N, DO  SPIRIVA  RESPIMAT 2.5 MCG/ACT AERS USE 2 INHALATIONS BY MOUTH ONCE  DAILY 07/31/23   Atway, Rayann N, DO  triamcinolone  ointment (KENALOG ) 0.1 % APPLY TOPICALLY TO AFFECTED  AREA(S) TWICE DAILY AS NEEDED  FOR ITCHING 07/04/22   Atway, Rayann N, DO    Allergies: Patient has no known allergies.    Review of Systems  Constitutional:   Positive for fever.  Neurological:  Positive for weakness.    Updated Vital Signs BP (!) 177/90 (BP Location: Left Arm)   Pulse 63   Temp 98.4 F (36.9 C) (Oral)   Resp 20   SpO2 97%   Physical Exam  (all labs ordered are listed, but only abnormal results are displayed) Labs Reviewed  COMPREHENSIVE METABOLIC PANEL WITH GFR - Abnormal; Notable for the following components:      Result Value   Glucose, Bld 113 (*)    BUN 24 (*)    Creatinine, Ser 2.23 (*)    GFR, Estimated 31 (*)    All other components within normal limits  CBC WITH DIFFERENTIAL/PLATELET - Abnormal; Notable for the following components:   RBC 3.70 (*)    Hemoglobin 10.7 (*)    HCT 34.2 (*)    Monocytes Absolute 1.3 (*)    All other components within normal limits  URINALYSIS, W/ REFLEX TO CULTURE (INFECTION SUSPECTED) - Abnormal; Notable for the following components:   APPearance HAZY (*)    Glucose, UA >=500 (*)    Protein, ur >=300 (*)    Leukocytes,Ua MODERATE (*)    All other components within normal limits  I-STAT CG4 LACTIC ACID, ED - Abnormal; Notable for the following components:   Lactic Acid, Venous 0.4 (*)    All other components within normal limits  I-STAT CHEM 8, ED - Abnormal; Notable for the following components:   BUN 24 (*)    Creatinine, Ser 2.40 (*)    Glucose, Bld 113 (*)    Hemoglobin 11.2 (*)    HCT 33.0 (*)    All other components within normal limits  RESP PANEL BY RT-PCR (RSV, FLU A&B, COVID)  [id number]  GROUP A STREP BY PCR  URINE CULTURE    EKG: None  Radiology: DG Chest 2 View Result Date: 12/12/2023 CLINICAL DATA:  Fever. EXAM: CHEST - 2 VIEW COMPARISON:  Chest radiograph dated 03/16/2022. FINDINGS: No focal consolidation, pleural effusion pneumothorax. Mild cardiomegaly. No acute osseous pathology. IMPRESSION: 1. No active cardiopulmonary disease. 2. Mild cardiomegaly. Electronically Signed   By: Vanetta Chou M.D.   On: 12/12/2023 20:06    {Document cardiac  monitor, telemetry assessment procedure when appropriate:32947} Procedures   Medications Ordered in the ED - No data to display    {Click here for ABCD2, HEART and other calculators REFRESH Note before signing:1}                              Medical Decision Making Amount and/or Complexity of Data Reviewed Labs: ordered. Radiology: ordered.   ***  {Document critical care time when appropriate  Document review of labs and clinical decision tools ie [id number], etc  Document your independent review of radiology images and any outside records  Document your discussion with family members, caretakers and with consultants  Document social determinants of health affecting pt's care  Document your decision making why or why not admission, treatments were needed:32947:::1}   Final diagnoses:  None    ED Discharge Orders     None

## 2023-12-12 NOTE — ED Triage Notes (Signed)
 C/O neck pain and failure to thrive since Friday. Home nurse wanted pt to be evaluated. No recent falls. C/O fevers.

## 2023-12-13 ENCOUNTER — Inpatient Hospital Stay (HOSPITAL_COMMUNITY)

## 2023-12-13 DIAGNOSIS — Z7984 Long term (current) use of oral hypoglycemic drugs: Secondary | ICD-10-CM | POA: Diagnosis not present

## 2023-12-13 DIAGNOSIS — N390 Urinary tract infection, site not specified: Secondary | ICD-10-CM | POA: Diagnosis present

## 2023-12-13 DIAGNOSIS — E1142 Type 2 diabetes mellitus with diabetic polyneuropathy: Secondary | ICD-10-CM | POA: Diagnosis not present

## 2023-12-13 DIAGNOSIS — F17210 Nicotine dependence, cigarettes, uncomplicated: Secondary | ICD-10-CM

## 2023-12-13 DIAGNOSIS — Z79899 Other long term (current) drug therapy: Secondary | ICD-10-CM

## 2023-12-13 DIAGNOSIS — N400 Enlarged prostate without lower urinary tract symptoms: Secondary | ICD-10-CM

## 2023-12-13 DIAGNOSIS — E1122 Type 2 diabetes mellitus with diabetic chronic kidney disease: Secondary | ICD-10-CM

## 2023-12-13 DIAGNOSIS — K219 Gastro-esophageal reflux disease without esophagitis: Secondary | ICD-10-CM | POA: Diagnosis not present

## 2023-12-13 DIAGNOSIS — M50221 Other cervical disc displacement at C4-C5 level: Secondary | ICD-10-CM | POA: Diagnosis not present

## 2023-12-13 DIAGNOSIS — Z841 Family history of disorders of kidney and ureter: Secondary | ICD-10-CM | POA: Diagnosis not present

## 2023-12-13 DIAGNOSIS — N1832 Chronic kidney disease, stage 3b: Secondary | ICD-10-CM

## 2023-12-13 DIAGNOSIS — I5030 Unspecified diastolic (congestive) heart failure: Secondary | ICD-10-CM

## 2023-12-13 DIAGNOSIS — R8281 Pyuria: Secondary | ICD-10-CM

## 2023-12-13 DIAGNOSIS — N179 Acute kidney failure, unspecified: Secondary | ICD-10-CM | POA: Diagnosis present

## 2023-12-13 DIAGNOSIS — Z1152 Encounter for screening for COVID-19: Secondary | ICD-10-CM | POA: Diagnosis not present

## 2023-12-13 DIAGNOSIS — G8929 Other chronic pain: Secondary | ICD-10-CM | POA: Diagnosis present

## 2023-12-13 DIAGNOSIS — R627 Adult failure to thrive: Secondary | ICD-10-CM | POA: Diagnosis present

## 2023-12-13 DIAGNOSIS — I5032 Chronic diastolic (congestive) heart failure: Secondary | ICD-10-CM | POA: Diagnosis present

## 2023-12-13 DIAGNOSIS — Z7951 Long term (current) use of inhaled steroids: Secondary | ICD-10-CM

## 2023-12-13 DIAGNOSIS — Z833 Family history of diabetes mellitus: Secondary | ICD-10-CM | POA: Diagnosis not present

## 2023-12-13 DIAGNOSIS — L089 Local infection of the skin and subcutaneous tissue, unspecified: Secondary | ICD-10-CM | POA: Diagnosis present

## 2023-12-13 DIAGNOSIS — R519 Headache, unspecified: Secondary | ICD-10-CM | POA: Diagnosis not present

## 2023-12-13 DIAGNOSIS — K5900 Constipation, unspecified: Secondary | ICD-10-CM | POA: Diagnosis not present

## 2023-12-13 DIAGNOSIS — R531 Weakness: Secondary | ICD-10-CM | POA: Diagnosis not present

## 2023-12-13 DIAGNOSIS — E1140 Type 2 diabetes mellitus with diabetic neuropathy, unspecified: Secondary | ICD-10-CM | POA: Diagnosis present

## 2023-12-13 DIAGNOSIS — Z7982 Long term (current) use of aspirin: Secondary | ICD-10-CM | POA: Diagnosis not present

## 2023-12-13 DIAGNOSIS — R291 Meningismus: Secondary | ICD-10-CM

## 2023-12-13 DIAGNOSIS — H9190 Unspecified hearing loss, unspecified ear: Secondary | ICD-10-CM | POA: Diagnosis present

## 2023-12-13 DIAGNOSIS — M542 Cervicalgia: Secondary | ICD-10-CM | POA: Diagnosis not present

## 2023-12-13 DIAGNOSIS — E785 Hyperlipidemia, unspecified: Secondary | ICD-10-CM

## 2023-12-13 DIAGNOSIS — R509 Fever, unspecified: Secondary | ICD-10-CM | POA: Diagnosis not present

## 2023-12-13 DIAGNOSIS — J449 Chronic obstructive pulmonary disease, unspecified: Secondary | ICD-10-CM

## 2023-12-13 DIAGNOSIS — I130 Hypertensive heart and chronic kidney disease with heart failure and stage 1 through stage 4 chronic kidney disease, or unspecified chronic kidney disease: Secondary | ICD-10-CM | POA: Diagnosis not present

## 2023-12-13 DIAGNOSIS — M4802 Spinal stenosis, cervical region: Secondary | ICD-10-CM | POA: Diagnosis not present

## 2023-12-13 DIAGNOSIS — R1312 Dysphagia, oropharyngeal phase: Secondary | ICD-10-CM | POA: Diagnosis present

## 2023-12-13 DIAGNOSIS — E781 Pure hyperglyceridemia: Secondary | ICD-10-CM | POA: Diagnosis present

## 2023-12-13 DIAGNOSIS — M47812 Spondylosis without myelopathy or radiculopathy, cervical region: Secondary | ICD-10-CM | POA: Diagnosis not present

## 2023-12-13 DIAGNOSIS — B370 Candidal stomatitis: Secondary | ICD-10-CM | POA: Diagnosis present

## 2023-12-13 LAB — PROTEIN, CSF: Total  Protein, CSF: 45 mg/dL (ref 15–45)

## 2023-12-13 LAB — MENINGITIS/ENCEPHALITIS PANEL (CSF)

## 2023-12-13 LAB — BASIC METABOLIC PANEL WITH GFR
Anion gap: 13 (ref 5–15)
BUN: 25 mg/dL — ABNORMAL HIGH (ref 8–23)
CO2: 23 mmol/L (ref 22–32)
Calcium: 9.6 mg/dL (ref 8.9–10.3)
Chloride: 106 mmol/L (ref 98–111)
Creatinine, Ser: 2.15 mg/dL — ABNORMAL HIGH (ref 0.61–1.24)
GFR, Estimated: 32 mL/min — ABNORMAL LOW (ref >60)
Glucose, Bld: 109 mg/dL — ABNORMAL HIGH (ref 70–99)
Potassium: 4 mmol/L (ref 3.5–5.1)
Sodium: 142 mmol/L (ref 135–145)

## 2023-12-13 LAB — CBC
HCT: 31.1 % — ABNORMAL LOW (ref 39.0–52.0)
Hemoglobin: 9.8 g/dL — ABNORMAL LOW (ref 13.0–17.0)
MCH: 29.3 pg (ref 26.0–34.0)
MCHC: 31.5 g/dL (ref 30.0–36.0)
MCV: 92.8 fL (ref 80.0–100.0)
Platelets: 177 K/uL (ref 150–400)
RBC: 3.35 MIL/uL — ABNORMAL LOW (ref 4.22–5.81)
RDW: 14.2 % (ref 11.5–15.5)
WBC: 7.3 K/uL (ref 4.0–10.5)
nRBC: 0 % (ref 0.0–0.2)

## 2023-12-13 LAB — GLUCOSE, CAPILLARY
Glucose-Capillary: 116 mg/dL — ABNORMAL HIGH (ref 70–99)
Glucose-Capillary: 146 mg/dL — ABNORMAL HIGH (ref 70–99)
Glucose-Capillary: 142 mg/dL — ABNORMAL HIGH (ref 70–99)

## 2023-12-13 LAB — CSF CELL COUNT WITH DIFFERENTIAL
RBC Count, CSF: 1 /mm3 — ABNORMAL HIGH
RBC Count, CSF: 29 /mm3 — ABNORMAL HIGH
Tube #: 1
Tube #: 4
WBC, CSF: 1 /mm3 (ref 0–5)
WBC, CSF: 2 /mm3 (ref 0–5)

## 2023-12-13 LAB — URINE CULTURE

## 2023-12-13 LAB — GLUCOSE, CSF: Glucose, CSF: 66 mg/dL (ref 40–70)

## 2023-12-13 LAB — CBG MONITORING, ED: Glucose-Capillary: 102 mg/dL — ABNORMAL HIGH (ref 70–99)

## 2023-12-13 LAB — RPR: RPR Ser Ql: NONREACTIVE

## 2023-12-13 MED ORDER — GABAPENTIN 300 MG PO CAPS
300.0000 mg | ORAL_CAPSULE | Freq: Two times a day (BID) | ORAL | Status: DC
  Administered 2023-12-13 – 2023-12-14 (×3): 300 mg via ORAL
  Filled 2023-12-13 (×3): qty 1

## 2023-12-13 MED ORDER — SODIUM CHLORIDE 0.9 % IV SOLN
2.0000 g | INTRAVENOUS | Status: DC
  Administered 2023-12-13: 2 g via INTRAVENOUS
  Filled 2023-12-13: qty 20

## 2023-12-13 MED ORDER — ACETAMINOPHEN 10 MG/ML IV SOLN
1000.0000 mg | Freq: Four times a day (QID) | INTRAVENOUS | Status: AC
  Administered 2023-12-13 – 2023-12-14 (×4): 1000 mg via INTRAVENOUS
  Filled 2023-12-13 (×4): qty 100

## 2023-12-13 MED ORDER — HYDROMORPHONE HCL 1 MG/ML IJ SOLN
0.5000 mg | INTRAMUSCULAR | Status: DC | PRN
  Administered 2023-12-13 – 2023-12-14 (×3): 0.5 mg via INTRAVENOUS
  Filled 2023-12-13: qty 0.5
  Filled 2023-12-13: qty 1
  Filled 2023-12-13: qty 0.5

## 2023-12-13 MED ORDER — POLYETHYLENE GLYCOL 3350 17 G PO PACK: 17.0000 g | PACK | Freq: Every day | ORAL | Status: DC | PRN

## 2023-12-13 MED ORDER — CARVEDILOL 12.5 MG PO TABS
12.5000 mg | ORAL_TABLET | Freq: Two times a day (BID) | ORAL | Status: DC
  Administered 2023-12-13 – 2023-12-14 (×3): 12.5 mg via ORAL
  Filled 2023-12-13 (×3): qty 1

## 2023-12-13 MED ORDER — LIDOCAINE HCL 2 % IJ SOLN
5.0000 mL | Freq: Once | INTRAMUSCULAR | Status: AC
  Administered 2023-12-13: 100 mg
  Filled 2023-12-13: qty 20

## 2023-12-13 MED ORDER — ADULT MULTIVITAMIN W/MINERALS CH
1.0000 | ORAL_TABLET | Freq: Every day | ORAL | Status: DC
  Administered 2023-12-13 – 2023-12-14 (×2): 1 via ORAL
  Filled 2023-12-13 (×2): qty 1

## 2023-12-13 MED ORDER — VANCOMYCIN HCL 2000 MG/400ML IV SOLN
2000.0000 mg | Freq: Once | INTRAVENOUS | Status: AC
  Administered 2023-12-13: 2000 mg via INTRAVENOUS
  Filled 2023-12-13: qty 400

## 2023-12-13 MED ORDER — LOSARTAN POTASSIUM 50 MG PO TABS
100.0000 mg | ORAL_TABLET | Freq: Every day | ORAL | Status: DC
  Administered 2023-12-13 – 2023-12-14 (×2): 100 mg via ORAL
  Filled 2023-12-13 (×2): qty 2

## 2023-12-13 MED ORDER — SODIUM CHLORIDE 0.9 % IV SOLN
2.0000 g | Freq: Once | INTRAVENOUS | Status: AC
  Administered 2023-12-13: 2 g via INTRAVENOUS
  Filled 2023-12-13: qty 2000

## 2023-12-13 MED ORDER — EZETIMIBE 10 MG PO TABS
10.0000 mg | ORAL_TABLET | Freq: Every day | ORAL | Status: DC
  Administered 2023-12-13 – 2023-12-14 (×2): 10 mg via ORAL
  Filled 2023-12-13 (×2): qty 1

## 2023-12-13 MED ORDER — UMECLIDINIUM BROMIDE 62.5 MCG/ACT IN AEPB
1.0000 | INHALATION_SPRAY | Freq: Every day | RESPIRATORY_TRACT | Status: DC
  Administered 2023-12-13: 1 via RESPIRATORY_TRACT
  Filled 2023-12-13 (×3): qty 7

## 2023-12-13 MED ORDER — ROSUVASTATIN CALCIUM 20 MG PO TABS
40.0000 mg | ORAL_TABLET | Freq: Every day | ORAL | Status: DC
  Administered 2023-12-13 – 2023-12-14 (×2): 40 mg via ORAL
  Filled 2023-12-13 (×2): qty 2

## 2023-12-13 MED ORDER — INSULIN ASPART 100 UNIT/ML IJ SOLN: 0.0000 [IU] | Freq: Three times a day (TID) | INTRAMUSCULAR | Status: DC

## 2023-12-13 MED ORDER — TIOTROPIUM BROMIDE MONOHYDRATE 2.5 MCG/ACT IN AERS: INHALATION_SPRAY | Freq: Two times a day (BID) | RESPIRATORY_TRACT | Status: DC

## 2023-12-13 MED ORDER — PANTOPRAZOLE SODIUM 40 MG PO TBEC
80.0000 mg | DELAYED_RELEASE_TABLET | Freq: Every day | ORAL | Status: DC
  Administered 2023-12-13 – 2023-12-14 (×2): 80 mg via ORAL
  Filled 2023-12-13 (×2): qty 2

## 2023-12-13 MED ORDER — EMPAGLIFLOZIN 10 MG PO TABS
10.0000 mg | ORAL_TABLET | Freq: Every day | ORAL | Status: DC
  Administered 2023-12-13 – 2023-12-14 (×2): 10 mg via ORAL
  Filled 2023-12-13 (×2): qty 1

## 2023-12-13 MED ORDER — NYSTATIN 100000 UNIT/ML MT SUSP
5.0000 mL | Freq: Four times a day (QID) | OROMUCOSAL | Status: DC
  Administered 2023-12-13 – 2023-12-14 (×5): 500000 [IU] via ORAL
  Filled 2023-12-13 (×5): qty 5

## 2023-12-13 MED ORDER — ENOXAPARIN SODIUM 30 MG/0.3ML IJ SOSY
30.0000 mg | PREFILLED_SYRINGE | Freq: Every day | INTRAMUSCULAR | Status: DC
  Administered 2023-12-13 – 2023-12-14 (×2): 30 mg via SUBCUTANEOUS
  Filled 2023-12-13 (×2): qty 0.3

## 2023-12-13 MED ORDER — VANCOMYCIN HCL IN DEXTROSE 1-5 GM/200ML-% IV SOLN: 1000.0000 mg | Freq: Once | INTRAVENOUS | Status: DC

## 2023-12-13 MED ORDER — FENTANYL CITRATE PF 50 MCG/ML IJ SOSY
50.0000 ug | PREFILLED_SYRINGE | Freq: Once | INTRAMUSCULAR | Status: AC
  Administered 2023-12-13: 50 ug via INTRAVENOUS
  Filled 2023-12-13: qty 1

## 2023-12-13 MED ORDER — FINASTERIDE 5 MG PO TABS
5.0000 mg | ORAL_TABLET | Freq: Every day | ORAL | Status: DC
  Administered 2023-12-13 – 2023-12-14 (×2): 5 mg via ORAL
  Filled 2023-12-13 (×2): qty 1

## 2023-12-13 MED ORDER — METHOCARBAMOL 1000 MG/10ML IJ SOLN: 500.0000 mg | Freq: Three times a day (TID) | INTRAMUSCULAR | Status: DC | PRN

## 2023-12-13 MED ORDER — SODIUM CHLORIDE 0.9 % IV SOLN
2.0000 g | Freq: Once | INTRAVENOUS | Status: AC
  Administered 2023-12-13: 2 g via INTRAVENOUS
  Filled 2023-12-13: qty 20

## 2023-12-13 MED ORDER — FUROSEMIDE 40 MG PO TABS
80.0000 mg | ORAL_TABLET | Freq: Every morning | ORAL | Status: DC
Start: 2023-12-13 — End: 2023-12-14
  Administered 2023-12-13 – 2023-12-14 (×2): 80 mg via ORAL
  Filled 2023-12-13 (×2): qty 2

## 2023-12-13 MED ORDER — HYDRALAZINE HCL 50 MG PO TABS
50.0000 mg | ORAL_TABLET | Freq: Two times a day (BID) | ORAL | Status: DC
  Administered 2023-12-13 – 2023-12-14 (×3): 50 mg via ORAL
  Filled 2023-12-13 (×3): qty 1

## 2023-12-13 NOTE — Hospital Course (Signed)
 Head movement better

## 2023-12-13 NOTE — H&P (Addendum)
 Date: 12/13/2023               Patient Name:  Seth Santana MRN: [id number]  DOB: [birth year] Age / Sex: 73 y.o., male   PCP: Renne Homans, MD         Medical Service: Internal Medicine Teaching Service         Attending Physician: Dr. Rosan      First Contact: Rebecka Pion, DO  Pager:  [phone]  Second Contact: Dr. Saad Nooruddin, MD   Pager:  [phone]       After Hours  (After 5pm / First Contact Pager: [phone]  weekends / holidays): Second Contact Pager: [phone]   SUBJECTIVE   Chief Complaint: Neck pain  History of Present Illness: Seth Santana is a 73 y.o. male with PMH of HTN, DMII, diastolic heart failure, gout, hearing loss and CKD who presents for acute neck pain and difficulty maintaining PO intake.  History provided by daughter who lives with him due to patient's profound hearing loss.   He initially noticed his neck pain on Friday after waking up. He states that it felt like a crick in his neck and he felt that he slept on it wrong. He has severe pain and is unable to move his head from side to side because of stiffness. He has not been eating or drinking because of the pain in his neck and back of his throat. He has difficulty swallowing. He notes that he has had trouble drinking through the straw. His daughter noted that he had a fever to 100.7 today. He has been feeling chilled at home and spends his day wrapped up in a blanket next to the heater. He has been having a frontal headache. He has a mild productive cough that is not present at baseline. He has had no nausea and vomiting. There are no changes in his bowel or bladder habits, but he is constipated at baseline. He denies dysuria. He has not been confused and did not want to go to the hospital because he felt that his symptoms would get better, although they did not. His daughter is unable to identify any preceding incident to his symptoms. He denies photophobia and sick contacts.   ED Course: Vitals  were notable for hypertension and temperature of 99.6 Labs significant for low hemoglobin, sterile pyuria. Imaging of the neck showed osteoarthritic changes of the neck, no acute intracranial findings, cord compression at C4-5, and mild thickening of the pharyngeal membranes without any abscess.  Received Fentanyl  2x 50 mcg and a lidocaine  injection.  Meds: Daughter states that he takes whatever comes in the mail order pharmacy shipment, unclear about individual medications. Medication list below unverified.  Aspirin  81mg  Carvedilol  12.5mg  Clenpic oral solution B-12 Jardiance  10mg  Ezetimibe  10mg  Ferrous sulfate  325mg  Fiber PO Finasteride  5mg  Furosemide  80mg  daily Gabapentin  300mg  BID Hydralazine  50mg  Hydroxyzine  10mg  q8 PRN Ketoconazole  cream Losartan  100mg  daily Multivitamin Nicotine  patch and gum Omeprazole  40mg  daily Rosuvastatin  40mg  Spiriva  2.5 Triamcinolone  0.1% cream.  No outpatient medications have been marked as taking for the 12/12/23 encounter [facility] Encounter).    Past Medical History Past Medical History:  Diagnosis Date   Blood in stool last few years   BPH (benign prostatic hyperplasia)    Closed fracture of tripod (HCC) 02/18/2013   Hard of hearing    Headache(784.0)    Hypertension   CKD3 DMII Anemia PAD HLD Gout  Past Surgical History Past Surgical History:  Procedure Laterality Date  COLONOSCOPY WITH PROPOFOL  N/A 10/09/2013   Procedure: COLONOSCOPY WITH PROPOFOL ;  Surgeon: Gladis MARLA Louder, MD;  Location: [facility];  Service: Endoscopy;  Laterality: N/A;   NO PAST SURGERIES     ORCHIECTOMY Right 03/15/2022   Procedure: INGUINAL RADICAL ORCHIECTOMY;  Surgeon: Alvaro Hummer, MD;  Location: [facility] ORS;  Service: Urology;  Laterality: Right;    Social:  Lives With: Daughter and wife  Occupation: Unemployed  Support: Family  Level of Function: independent ADLs and iADLs  PCP: Dr. Renne  Substances: -Tobacco: smokes menthol cigars 1 pack  per week  -Alcohol : 1-2 week  -Recreational Drug: smokes marijuana   Family History:  Family History  Problem Relation Age of Onset   Kidney disease Mother    Diabetes Mother    Cancer Brother     Allergies: Allergies as of 12/12/2023   (No Known Allergies)    Review of Systems: A complete ROS was negative except as per HPI.   OBJECTIVE:   Physical Exam: Blood pressure (!) 158/76, pulse 62, temperature 99.8 F (37.7 C), temperature source Oral, resp. rate 19, SpO2 100%.  Constitutional: alert, uncomfortable lying in bed. HENT: normocephalic atraumatic, mucous membranes moist. Neck with some lateral fullness that may be positional. No pharyngeal erythema, although exam limited by patients severe jaw pain, and inability to open mouth fully. Oral thrush present. Severe hearing loss. Eyes: conjunctiva non-erythematous Neck: no hypertonicity of the posterior cervical muscles, TTP is noted posterior and lateral cervical and paraspinal muscles. Unable/Unwilling to actively move neck. Cardiovascular: regular rate and rhythm, no m/r/g. 2+ peripheral pulses. Pulmonary/Chest: normal work of breathing on room air, lungs clear to auscultation bilaterally Abdominal: bowel sounds present, soft, non-tender, non-distended MSK: normal bulk and tone Neurological: alert & oriented x 3, 5/5 strength in bilateral upper and lower extremities. Positive Brudzinski, Knee extension with hip flexed causes increased neck pain. Skin: warm and dry  Labs: CBC    Component Value Date/Time   WBC 9.8 12/12/2023 1814   RBC 3.70 (L) 12/12/2023 1814   HGB 11.2 (L) 12/12/2023 1845   HGB 8.6 (L) 03/22/2022 1504   HCT 33.0 (L) 12/12/2023 1845   HCT 26.2 (L) 03/22/2022 1504   PLT 212 12/12/2023 1814   PLT 407 03/22/2022 1504   MCV 92.4 12/12/2023 1814   MCV 90 03/22/2022 1504   MCH 28.9 12/12/2023 1814   MCHC 31.3 12/12/2023 1814   RDW 14.3 12/12/2023 1814   RDW 13.6 03/22/2022 1504   LYMPHSABS 1.9  12/12/2023 1814   MONOABS 1.3 (H) 12/12/2023 1814   EOSABS 0.1 12/12/2023 1814   BASOSABS 0.1 12/12/2023 1814     CMP     Component Value Date/Time   NA 142 12/12/2023 1845   NA 141 11/23/2023 1007   K 4.5 12/12/2023 1845   CL 109 12/12/2023 1845   CO2 22 12/12/2023 1814   GLUCOSE 113 (H) 12/12/2023 1845   BUN 24 (H) 12/12/2023 1845   BUN 41 (H) 11/23/2023 1007   CREATININE 2.40 (H) 12/12/2023 1845   CREATININE 1.18 05/23/2013 1611   CALCIUM  10.0 12/12/2023 1814   PROT 7.6 12/12/2023 1814   PROT 7.3 12/13/2022 0831   ALBUMIN 3.7 12/12/2023 1814   ALBUMIN 4.2 12/13/2022 0831   AST 17 12/12/2023 1814   ALT 16 12/12/2023 1814   ALKPHOS 60 12/12/2023 1814   BILITOT 0.8 12/12/2023 1814   BILITOT 0.3 12/13/2022 0831   GFRNONAA 31 (L) 12/12/2023 1814   GFRNONAA 67 05/23/2013 1611  GFRAA 77 05/23/2013 1611     Imaging:  CT C-SPINE NO CHARGE CLINICAL DATA:  Neck pain with epiglottitis or tonsillitis suspected. Concern for retropharyngeal abscess. Headache and failure to thrive with weakness and fever.  EXAM: CT HEAD WITHOUT CONTRAST  CT CERVICAL SPINE WITHOUT CONTRAST  TECHNIQUE: Multidetector CT imaging of the head and cervical spine was performed following the standard protocol without intravenous contrast. Multiplanar CT image reconstructions of the cervical spine were also generated.  RADIATION DOSE REDUCTION: This exam was performed according to the departmental dose-optimization program which includes automated exposure control, adjustment of the mA and/or kV according to patient size and/or use of iterative reconstruction technique.  COMPARISON:  CT scan head and cervical spine both 02/08/2013. No studies that are more recent.  FINDINGS: CT HEAD FINDINGS  Brain: There is moderate for age cerebral cortical volume loss, mild small vessel disease and atrophic ventriculomegaly and mild cerebellar atrophy.  No acute infarct, hemorrhage or mass are seen.  There is no midline shift. Basal cisterns are clear.  Vascular: Moderate calcific plaque again noted both carotid siphons. No hyperdense central vessels. Calcific plaque in both distal vertebral arteries as well.  Skull: Negative for fractures or focal lesions.  Sinuses/Orbits: No acute finding.  Other: None.  CT CERVICAL SPINE FINDINGS  Alignment: Straightened and slightly reversed cervical lordosis centered at C4, similar to the previous exam.  Bone-on-bone anterior atlantodental joint space loss and prominent osteophytes are again noted, unchanged. No traumatic alignment abnormality or listhesis is seen.  Skull base and vertebrae: No acute fracture. No primary bone lesion or focal pathologic process.  There is osteopenia. 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  There is at least partial facet joint ankylosis at C5-6. Other facets appear to be patent.  Posterior osteophytes are present at most levels but are fairly small for the most part.  Soft tissues and spinal canal: No prevertebral fluid or swelling. No visible canal hematoma.  Lobular prominence is seen along the arytenoid eminence, but it also was enlarged in 2014 and has not significantly changed. The airway is narrowed to 9 x 7 mm at this level.  The epiglottis is unremarkable. Vocal folds are symmetric. There does appear to be mild thickening of the pharyngeal membranes but no underlying abscess. The tonsils are not enlarged.  Disc levels: The discs are diffusely degenerated. There is progressive disc collapse at C7-T1 with progressive disc space loss at this level as well as at C2-3, C3-4 and C4-5, since 2014.  At C4-5, there is a posterior disc protrusion slightly compressing the cord, with 7 mm AP thecal sac. Posterior osteophytes at  C6-7 at C7-T1 again causing borderline mass effect on the cord.  Left-greater-than-right facet hypertrophy with uncinate joint spurring multiple levels.  Acquired foraminal stenosis is moderate but the left at C3-4, severe on the left and moderate to severe on the right at C3-4, bilaterally mild at C4-5, and bilaterally mild-to-moderate at C7-T1.  Upper chest: Negative.  Other: None.  IMPRESSION: 1. No acute intracranial CT findings or depressed skull fractures. 2. Atrophy and small-vessel disease with a mild progression since 2014. 3. Osteopenia and degenerative change of the cervical spine without evidence of fractures or traumatic listhesis. 4. Bulky anterior osteophytes C3-7, with attempted interbody bridging C2-3, C3-4 and C4-5 and with interbody ankylosis at C5-6 and C6-7.  5. Posterior disc protrusion at C4-5 slightly compressing the cord, with 7 mm AP thecal sac. 6. Posterior osteophytes at C6-7 and C7-T1 again causing borderline mass effect on the cord. 7. Lobular prominence of the arytenoid eminence, but it also was enlarged in 2014 and has not significantly changed. The airway is narrowed to 9 x 7 mm at this level. 8. Mild thickening of the pharyngeal membranes but no underlying abscess.  Electronically Signed   By: Francis Quam M.D.   On: 12/13/2023 01:09 CT Head Wo Contrast CLINICAL DATA:  Neck pain with epiglottitis or tonsillitis suspected. Concern for retropharyngeal abscess. Headache and failure to thrive with weakness and fever.  EXAM: CT HEAD WITHOUT CONTRAST  CT CERVICAL SPINE WITHOUT CONTRAST  TECHNIQUE: Multidetector CT imaging of the head and cervical spine was performed following the standard protocol without intravenous contrast. Multiplanar CT image reconstructions of the cervical spine were also generated.  RADIATION DOSE REDUCTION: This exam was performed according to the departmental dose-optimization program which includes  automated exposure control, adjustment of the mA and/or kV according to patient size and/or use of iterative reconstruction technique.  COMPARISON:  CT scan head and cervical spine both 02/08/2013. No studies that are more recent.  FINDINGS: CT HEAD FINDINGS  Brain: There is moderate for age cerebral cortical volume loss, mild small vessel disease and atrophic ventriculomegaly and mild cerebellar atrophy.  No acute infarct, hemorrhage or mass are seen. There is no midline shift. Basal cisterns are clear.  Vascular: Moderate calcific plaque again noted both carotid siphons. No hyperdense central vessels. Calcific plaque in both distal vertebral arteries as well.  Skull: Negative for fractures or focal lesions.  Sinuses/Orbits: No acute finding.  Other: None.  CT CERVICAL SPINE FINDINGS  Alignment: Straightened and slightly reversed cervical lordosis centered at C4, similar to the previous exam.  Bone-on-bone anterior atlantodental joint space loss and prominent osteophytes are again noted, unchanged. No traumatic alignment abnormality or listhesis is seen.  Skull base and vertebrae: No acute fracture. No primary bone lesion or focal pathologic process.  There is osteopenia. 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  There is at least partial facet joint ankylosis at C5-6. Other facets appear to be patent.  Posterior osteophytes are present at most levels but are fairly small for the most part.  Soft tissues and spinal canal: No prevertebral fluid or swelling. No visible canal hematoma.  Lobular prominence is seen along the arytenoid eminence, but it also was enlarged in 2014 and has not significantly changed. The airway is narrowed to 9 x 7 mm at this level.  The epiglottis is unremarkable. Vocal  folds are symmetric. There does appear to be mild thickening of the pharyngeal membranes but no underlying abscess. The tonsils are not enlarged.  Disc levels: The discs are diffusely degenerated. There is progressive disc collapse at C7-T1 with progressive disc space loss at this level as well as at C2-3, C3-4 and C4-5, since 2014.  At C4-5, there is a posterior disc protrusion slightly compressing the cord, with 7 mm AP thecal sac. Posterior osteophytes at C6-7 at C7-T1 again causing borderline mass effect on the cord.  Left-greater-than-right facet hypertrophy with uncinate joint spurring multiple levels.  Acquired foraminal stenosis is moderate but the left at C3-4, severe on the left and moderate to severe on the right  at C3-4, bilaterally mild at C4-5, and bilaterally mild-to-moderate at C7-T1.  Upper chest: Negative.  Other: None.  IMPRESSION: 1. No acute intracranial CT findings or depressed skull fractures. 2. Atrophy and small-vessel disease with a mild progression since 2014. 3. Osteopenia and degenerative change of the cervical spine without evidence of fractures or traumatic listhesis. 4. Bulky anterior osteophytes C3-7, with attempted interbody bridging C2-3, C3-4 and C4-5 and with interbody ankylosis at C5-6 and C6-7. 5. Posterior disc protrusion at C4-5 slightly compressing the cord, with 7 mm AP thecal sac. 6. Posterior osteophytes at C6-7 and C7-T1 again causing borderline mass effect on the cord. 7. Lobular prominence of the arytenoid eminence, but it also was enlarged in 2014 and has not significantly changed. The airway is narrowed to 9 x 7 mm at this level. 8. Mild thickening of the pharyngeal membranes but no underlying abscess.  Electronically Signed   By: Francis Quam M.D.   On: 12/13/2023 01:09 CT Soft Tissue Neck Wo Contrast EXAM: CT NECK WITHOUT CONTRAST 12/13/2023 12:21:30 AM  TECHNIQUE: CT of the neck was performed without the  administration of intravenous contrast. Multiplanar reformatted images are provided for review. Automated exposure control, iterative reconstruction, and/or weight based adjustment of the mA/kV was utilized to reduce the radiation dose to as low as reasonably achievable.  COMPARISON: None available.  CLINICAL HISTORY: Epiglottitis or tonsillitis suspected; neck pain, concern for RPA. Headache, Epiglottitis or tonsillitis suspected, neck pain, concern for RPA, Failure To Thrive; Weakness; Fever.  FINDINGS:  AERODIGESTIVE TRACT: No discrete mass. No edema.  SALIVARY GLANDS: The parotid and submandibular glands are unremarkable.  THYROID : Unremarkable.  LYMPH NODES: No suspicious cervical lymphadenopathy.  SOFT TISSUES: No mass or fluid collection.  BRAIN, ORBITS, SINUSES AND MASTOIDS: No acute abnormality.  LUNGS AND MEDIASTINUM: No acute abnormality.  BONES: No focal bone abnormality. Multilevel degenerative disc disease and anterior osteophytosis.  IMPRESSION: 1. No acute findings or discrete mass in the neck.  Electronically signed by: Franky Stanford MD 12/13/2023 12:50 AM EDT RP Workstation: [id number]    ASSESSMENT & PLAN:   Assessment & Plan by Problem: Principal Problem:   Nuchal rigidity   Seth SHAMOON is a 73 y.o. person living with a history of  HTN, prediabetes, diastolic heart failure, gout, hearing loss and CKD who presented with acute neck pain, fever, and decreased PO intake and admitted for failure to thrive and concern for meningitis.  #Nuchal rigidity and pain #Poor PO intake Patient presents with neck pain for the past 5 days which has been gradually worsening. He has felt febrile with one measured temperature at home of 100.7. He also has neck stiffness, headache, and difficulty swallowing. Differential includes deep space infection of the neck vs meningitis vs migraine vs neck spasm. CT imaging did not show any abscess in the neck only  thickening of the pharyngeal membranes. LP performed in the ED showed borderline protein at 45, and 1-2 WBC. Normal glucose. No recent GI infections or weakness to suggest Guillain-Barre. Likely not meningitis without elevations in WBCs. Rapid strep negative. The ED contacted the radiologist who read the scan who said that the CT scan did not show evidence of deep space infection. Given the non-revealing nature of the LP, we will proceed with MRI of the soft tissues of the neck to assess for any abscess not seen on CT scan. He was started on Vancomycin , ampicillin  and Ceftriaxone  in the ED due to concerns for meningitis.  -MRI soft tissue of  neck without contrast. -NPO until he can complete a swallow screen given his dysphagia -Pain control with PRN opiates or Tylenol  if needed, patient complaining mostly of stiffness now, will defer -Continue Vancomycin , Ceftriaxone  until MRI rules out abscess -Discontinue Ampicillin  due to low suspicion for meningitis. -RPR -F/U BC  #Sterile Pyuria Asymptomatic with no dysuria. Urinalysis showed WBCs with no nitrites or bacteria. Will await urine culture. No further planned workup at this time.  -F/U urine culture  Chronic conditions without active management  #Chronic kidney disease, stage 3b (HCC) Creatinine 2.23 on admission, similar to baseline when reviewing [facility] data. Below recent Creatinine of 3.09 when AKI was suspected. Is seen by a nephrologist outside of [facility] system.  He is currently taking Jardiance  10mg  as prescribed by his nephrologist.  -Continue Jardiance  10mg  -BMP daily  #HTN BP elevated on admission to 155/81. Had not taken any of his home medications. Home meds Carvedilol  12.5mg , Hydralazine  50mg , Losartan  100mg , and Furosemide  80mg  daily.  -Continue Carvedilol  12.5mg , Losartan  100mg , Hydralazine  50mg , and Furosemide  80mg  daily.  #DMII A1C 6.9 on 11/23/23, unable to initiate metformin due to increased GFR. Glucose 113 on admission,  will likely not require active management. Could consider increasing Jardiance  to 25mg  for diabetes management.  -Jardiance  10mg , consider increasing dose outpatient. -Rosuvastatin  40mg   #HLD Elevated triglycerides to 239 in October of last year. Home meds Rosuvastatin  40mg  and Ezetimibe  10mg . -continue Rosuvastatin  40mg .  #HFpEF Echo 03/16/22 showed grade II diastolic dysfunction with normal ejection fraction. No volume overloaded at this time. Afterload reduction during hospitalization.  -Continue Carvedilol  12.5mg , Losartan  100mg , Hydralazine  50mg , and Furosemide  80mg  daily.  #BPH -Continue home Finasteride  5mg  daily  #GERD Home Omeprazole  changed to Pantoprazole  while inpatient. -Pantoprazole  80mg  daily  #Constipation Last BM 3 days ago. -Miralax   #Tobacco use -nicotine  patch while inpatient  #Peripheral neuropathy -Continue home med Gabapentin  300mg  BID  #COPD Home medicine Spiriva   -Incruse ellipta  while in hospital  Diet: NPO until swallow screen VTE: Enoxaparin  IVF: None, Code: Full Surrogate Decision Maker: Warren Needles, daughter   Prior to Admission Living Arrangement: Home, living with daughter Anticipated Discharge Location: Home Barriers to Discharge: Clinical improvement  Dispo: Admit patient to Inpatient with expected length of stay greater than 2 midnights.  Signed: Napoleon Limes, MD Internal Medicine Resident PGY-1 12/13/2023, 5:47 AM   Please contact IM Residency On-Call Pager at: [phone] or [phone].

## 2023-12-13 NOTE — Progress Notes (Signed)
 HD#0 SUBJECTIVE:  Patient Summary: Seth Santana is a 73 y.o. with a pertinent PMH of HFpEF, CKD, Gout, T2DM, HTN, who presented with acute neck pain and stiffness and admitted for prevertebral soft tissue infection.   Overnight Events: NAEON.  Interim History: Daughter at bedside and states that patient is feeling much better and has been able to eat and drink much more than he has in the past few days.  His neck stiffness has improved and he is able to move his head from side-to-side.  Still having difficulty opening mouth due to pain.  Pain under control at this moment.  OBJECTIVE:  Vital Signs: Vitals:   12/13/23 0640 12/13/23 0700 12/13/23 0806 12/13/23 0852  BP:  (!) 144/72 139/71 (!) 155/76  Pulse:  64 76 67  Resp:  19  16  Temp: 100 F (37.8 C)   98.6 F (37 C)  TempSrc: Oral   Oral  SpO2:  96%  95%    There were no vitals filed for this visit.   Intake/Output Summary (Last 24 hours) at 12/13/2023 1808 Last data filed at 12/13/2023 0900 Gross per 24 hour  Intake 240 ml  Output --  Net 240 ml   Net IO Since Admission: 240 mL [12/13/23 1808]  Physical Exam: Constitutional: well-appearing, well-nourished, in no acute distress HENT: normocephalic atraumatic, mucous membranes moist; unable to fully evaluate due to limited jaw opening due to the pain Eyes: conjunctiva non-erythematous, PERRL, no scleral icterus Neck: Neck rotation and lateral flexion with reduced range of motion due to pain; mildly TTP of posterior and lateral cervical and paraspinal muscles Cardiovascular: regular rate and rhythm, no m/r/g Pulmonary/Chest: normal work of breathing on room air, lungs clear to auscultation bilaterally Abdominal: soft, non-tender, non-distended, bowel sounds normal MSK: normal bulk and tone Neurological: alert & oriented x3 Skin: warm and dry Extremities: no edema or cyanosis; peripheral pulses intact Psych: normal mood and affect, thought content normal  Patient  Lines/Drains/Airways Status     Active Line/Drains/Airways     Name Placement date Placement time Site Days   Peripheral IV 12/12/23 20 G Left Antecubital 12/12/23  2340  Antecubital  1   Open Drain 1 Right Other (Comment) 03/15/22  1254  Other (Comment)  638            Pertinent labs and imaging:     Latest Ref Rng & Units 12/13/2023    6:41 AM 12/12/2023    6:45 PM 12/12/2023    6:14 PM  CBC  WBC 4.0 - 10.5 K/uL 7.3   9.8   Hemoglobin 13.0 - 17.0 g/dL 9.8  88.7  89.2   Hematocrit 39.0 - 52.0 % 31.1  33.0  34.2   Platelets 150 - 400 K/uL 177   212        Latest Ref Rng & Units 12/13/2023    6:41 AM 12/12/2023    6:45 PM 12/12/2023    6:14 PM  CMP  Glucose 70 - 99 mg/dL 890  886  886   BUN 8 - 23 mg/dL 25  24  24    Creatinine 0.61 - 1.24 mg/dL 7.84  7.59  7.76   Sodium 135 - 145 mmol/L 142  142  143   Potassium 3.5 - 5.1 mmol/L 4.0  4.5  4.5   Chloride 98 - 111 mmol/L 106  109  107   CO2 22 - 32 mmol/L 23   22   Calcium  8.9 - 10.3 mg/dL  9.6   10.0   Total Protein 6.5 - 8.1 g/dL   7.6   Total Bilirubin 0.0 - 1.2 mg/dL   0.8   Alkaline Phos 38 - 126 U/L   60   AST 15 - 41 U/L   17   ALT 0 - 44 U/L   16     MR NECK SOFT TISSUE ONLY WO CONTRAST Result Date: 12/13/2023 EXAM: MR NECK WITHOUT INTRAVENOUS CONTRAST 12/13/2023 05:53:59 AM TECHNIQUE: Multiplanar multisequence MRI of the neck was performed without the administration of intravenous contrast. COMPARISON: None available. CLINICAL HISTORY: Soft tissue infection suspected, neck, xray done. Soft tissue infection suspected, neck. FINDINGS: PHARYNX AND LARYNX: The palatine tonsils are normal in appearance. The tongue is normal in appearance. The valleculae, epiglottis, aryepiglottic folds and pyriform sinuses appear unremarkable. The true and false vocal cords are normal in appearance. No mass or abscess is seen. SALIVARY GLANDS: The parotid and submandibular glands appear unremarkable. THYROID : The thyroid  gland appears  unremarkable. LYMPH NODES: No cervical or supraclavicular lymphadenopathy is seen. SOFT TISSUES: There is mild prevertebral soft tissue edema from C2 through C4. BRAIN, ORBITS AND SINUSES: The visualized portion of the intracranial contents appear unremarkable. The visualized portion of the orbits, paranasal sinuses and mastoid air cells demonstrate no acute abnormality. BONES: There is diffuse degenerative disc disease throughout the cervical spine. There is mild reversal of the normal cervical lordosis. There are anterior osteophytes present at C2-3 and C4-5. There is moderate central spinal canal stenosis at C4-5 secondary to posterior disc bulging and endplate ridging. IMPRESSION: 1. Mild prevertebral soft tissue edema from C2 through C4, possibly related to the suspected soft tissue infection. 2. Diffuse degenerative disc disease throughout the cervical spine with moderate central spinal canal stenosis at C4-5 secondary to posterior disc bulging and endplate ridging. There is no evidence of diskitis or osteomyelitis. Electronically signed by: Evalene Coho MD 12/13/2023 06:14 AM EDT RP Workstation: [id number]   CT Head Wo Contrast Result Date: 12/13/2023 CLINICAL DATA:  Neck pain with epiglottitis or tonsillitis suspected. Concern for retropharyngeal abscess. Headache and failure to thrive with weakness and fever. EXAM: CT HEAD WITHOUT CONTRAST CT CERVICAL SPINE WITHOUT CONTRAST TECHNIQUE: Multidetector CT imaging of the head and cervical spine was performed following the standard protocol without intravenous contrast. Multiplanar CT image reconstructions of the cervical spine were also generated. RADIATION DOSE REDUCTION: This exam was performed according to the departmental dose-optimization program which includes automated exposure control, adjustment of the mA and/or kV according to patient size and/or use of iterative reconstruction technique. COMPARISON:  CT scan head and cervical spine both  02/08/2013. No studies that are more recent. FINDINGS: CT HEAD FINDINGS Brain: There is moderate for age cerebral cortical volume loss, mild small vessel disease and atrophic ventriculomegaly and mild cerebellar atrophy. No acute infarct, hemorrhage or mass are seen. There is no midline shift. Basal cisterns are clear. Vascular: Moderate calcific plaque again noted both carotid siphons. No hyperdense central vessels. Calcific plaque in both distal vertebral arteries as well. Skull: Negative for fractures or focal lesions. Sinuses/Orbits: No acute finding. Other: None. CT CERVICAL SPINE FINDINGS Alignment: Straightened and slightly reversed cervical lordosis centered at C4, similar to the previous exam. Bone-on-bone anterior atlantodental joint space loss and prominent osteophytes are again noted, unchanged. No traumatic alignment abnormality or listhesis is seen. Skull base and vertebrae: No acute fracture. No primary bone lesion or focal pathologic process. There is osteopenia. 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 There is at least partial facet joint ankylosis at C5-6. Other facets appear to be patent. Posterior osteophytes are present at most levels but are fairly small for the most part. Soft tissues and spinal canal: No prevertebral fluid or swelling. No visible canal hematoma. Lobular prominence is seen along the arytenoid eminence, but it also was enlarged in 2014 and has not significantly changed. The airway is narrowed to 9 x 7 mm at this level. The epiglottis is unremarkable. Vocal folds are symmetric. There does appear to be mild thickening of the pharyngeal membranes but no underlying abscess. The tonsils are not enlarged. Disc levels: The discs are diffusely degenerated. There is progressive disc collapse at C7-T1 with progressive disc  space loss at this level as well as at C2-3, C3-4 and C4-5, since 2014. At C4-5, there is a posterior disc protrusion slightly compressing the cord, with 7 mm AP thecal sac. Posterior osteophytes at C6-7 at C7-T1 again causing borderline mass effect on the cord. Left-greater-than-right facet hypertrophy with uncinate joint spurring multiple levels. Acquired foraminal stenosis is moderate but the left at C3-4, severe on the left and moderate to severe on the right at C3-4, bilaterally mild at C4-5, and bilaterally mild-to-moderate at C7-T1. Upper chest: Negative. Other: None. IMPRESSION: 1. No acute intracranial CT findings or depressed skull fractures. 2. Atrophy and small-vessel disease with a mild progression since 2014. 3. Osteopenia and degenerative change of the cervical spine without evidence of fractures or traumatic listhesis. 4. Bulky anterior osteophytes C3-7, with attempted interbody bridging C2-3, C3-4 and C4-5 and with interbody ankylosis at C5-6 and C6-7. 5. Posterior disc protrusion at C4-5 slightly compressing the cord, with 7 mm AP thecal sac. 6. Posterior osteophytes at C6-7 and C7-T1 again causing borderline mass effect on the cord. 7. Lobular prominence of the arytenoid eminence, but it also was enlarged in 2014 and has not significantly changed. The airway is narrowed to 9 x 7 mm at this level. 8. Mild thickening of the pharyngeal membranes but no underlying abscess. Electronically Signed   By: Francis Quam M.D.   On: 12/13/2023 01:09   CT C-SPINE NO CHARGE Result Date: 12/13/2023 CLINICAL DATA:  Neck pain with epiglottitis or tonsillitis suspected. Concern for retropharyngeal abscess. Headache and failure to thrive with weakness and fever. EXAM: CT HEAD WITHOUT CONTRAST CT CERVICAL SPINE WITHOUT CONTRAST TECHNIQUE: Multidetector CT imaging of the head and cervical spine was performed following the standard protocol without intravenous contrast. Multiplanar CT image reconstructions of the  cervical spine were also generated. RADIATION DOSE REDUCTION: This exam was performed according to the departmental dose-optimization program which includes automated exposure control, adjustment of the mA and/or kV according to patient size and/or use of iterative reconstruction technique. COMPARISON:  CT scan head and cervical spine both 02/08/2013. No studies that are more recent. FINDINGS: CT HEAD FINDINGS Brain: There is moderate for age cerebral cortical volume loss, mild small vessel disease and atrophic ventriculomegaly and mild cerebellar atrophy. No acute infarct, hemorrhage or mass are seen. There is no midline shift. Basal cisterns are clear. Vascular: Moderate calcific plaque again noted both carotid siphons. No hyperdense central vessels. Calcific plaque in both distal vertebral arteries as well. Skull: Negative for fractures or focal lesions. Sinuses/Orbits: No acute finding. Other: None. CT CERVICAL SPINE FINDINGS Alignment: Straightened and slightly reversed cervical lordosis centered at C4,  similar to the previous exam. Bone-on-bone anterior atlantodental joint space loss and prominent osteophytes are again noted, unchanged. No traumatic alignment abnormality or listhesis is seen. Skull base and vertebrae: No acute fracture. No primary bone lesion or focal pathologic process. There is osteopenia. 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 There is at least partial facet joint ankylosis at C5-6. Other facets appear to be patent. Posterior osteophytes are present at most levels but are fairly small for the most part. Soft tissues and spinal canal: No prevertebral fluid or swelling. No visible canal hematoma. Lobular prominence is seen along the arytenoid eminence, but it also was enlarged in 2014 and has not significantly changed. The airway  is narrowed to 9 x 7 mm at this level. The epiglottis is unremarkable. Vocal folds are symmetric. There does appear to be mild thickening of the pharyngeal membranes but no underlying abscess. The tonsils are not enlarged. Disc levels: The discs are diffusely degenerated. There is progressive disc collapse at C7-T1 with progressive disc space loss at this level as well as at C2-3, C3-4 and C4-5, since 2014. At C4-5, there is a posterior disc protrusion slightly compressing the cord, with 7 mm AP thecal sac. Posterior osteophytes at C6-7 at C7-T1 again causing borderline mass effect on the cord. Left-greater-than-right facet hypertrophy with uncinate joint spurring multiple levels. Acquired foraminal stenosis is moderate but the left at C3-4, severe on the left and moderate to severe on the right at C3-4, bilaterally mild at C4-5, and bilaterally mild-to-moderate at C7-T1. Upper chest: Negative. Other: None. IMPRESSION: 1. No acute intracranial CT findings or depressed skull fractures. 2. Atrophy and small-vessel disease with a mild progression since 2014. 3. Osteopenia and degenerative change of the cervical spine without evidence of fractures or traumatic listhesis. 4. Bulky anterior osteophytes C3-7, with attempted interbody bridging C2-3, C3-4 and C4-5 and with interbody ankylosis at C5-6 and C6-7. 5. Posterior disc protrusion at C4-5 slightly compressing the cord, with 7 mm AP thecal sac. 6. Posterior osteophytes at C6-7 and C7-T1 again causing borderline mass effect on the cord. 7. Lobular prominence of the arytenoid eminence, but it also was enlarged in 2014 and has not significantly changed. The airway is narrowed to 9 x 7 mm at this level. 8. Mild thickening of the pharyngeal membranes but no underlying abscess. Electronically Signed   By: Francis Quam M.D.   On: 12/13/2023 01:09   CT Soft Tissue Neck Wo Contrast Result Date: 12/13/2023 EXAM: CT NECK WITHOUT CONTRAST 12/13/2023 12:21:30 AM TECHNIQUE: CT  of the neck was performed without the administration of intravenous contrast. Multiplanar reformatted images are provided for review. Automated exposure control, iterative reconstruction, and/or weight based adjustment of the mA/kV was utilized to reduce the radiation dose to as low as reasonably achievable. COMPARISON: None available. CLINICAL HISTORY: Epiglottitis or tonsillitis suspected; neck pain, concern for RPA. Headache, Epiglottitis or tonsillitis suspected, neck pain, concern for RPA, Failure To Thrive; Weakness; Fever. FINDINGS: AERODIGESTIVE TRACT: No discrete mass. No edema. SALIVARY GLANDS: The parotid and submandibular glands are unremarkable. THYROID : Unremarkable. LYMPH NODES: No suspicious cervical lymphadenopathy. SOFT TISSUES: No mass or fluid collection. BRAIN, ORBITS, SINUSES AND MASTOIDS: No acute abnormality. LUNGS AND MEDIASTINUM: No acute abnormality. BONES: No focal bone abnormality. Multilevel degenerative disc disease and anterior osteophytosis. IMPRESSION: 1. No acute findings or discrete mass in  the neck. Electronically signed by: Franky Stanford MD 12/13/2023 12:50 AM EDT RP Workstation: [id number]   DG Chest 2 View Result Date: 12/12/2023 CLINICAL DATA:  Fever. EXAM: CHEST - 2 VIEW COMPARISON:  Chest radiograph dated 03/16/2022. FINDINGS: No focal consolidation, pleural effusion pneumothorax. Mild cardiomegaly. No acute osseous pathology. IMPRESSION: 1. No active cardiopulmonary disease. 2. Mild cardiomegaly. Electronically Signed   By: Vanetta Chou M.D.   On: 12/12/2023 20:06    ASSESSMENT/PLAN:  Assessment: Principal Problem:   Nuchal rigidity   Plan: #Prevertebral Soft Tissue Infection Patient presenting with neck pain and stiffness that has significantly improved compared to admission. VSS but was initially febrile at home. Has not spiked a fever during this admission. Normal WBC. Initial exam showing nuchal rigidity with positive Brudzinski sign. However, CSF  studies from LP were wnl.  MRI showing mild prevertebral soft tissue edema from C2-C4 c/f soft tissue infection which is the most likely cause of the patient's sx. Not concerned for meningitis at this time. RPR negative. Patient is not an IVDU or immunocompromised and thus at low risk for infection with MRSA. Will de-escalate antibiotics to ceftriaxone . If patient clinically improves will anticipate discharge tomorrow with oral antibiotics. -Continue IV Ceftriaxone  2g daily  #Sterile Pyruia Patient not reporting any dysuria or urinary frequency. Urine culture is still pending but he is already receiving ceftriaxone  which would be adequate treatment for simple UTI anyway.  --- Chronic conditions without active management  #Chronic kidney disease, stage 3b (HCC) Creatinine 2.23 on admission, similar to baseline when reviewing [facility] data. Below recent Creatinine of 3.09 when AKI was suspected. Is seen by a nephrologist outside of [facility] system.  He is currently taking Jardiance  10mg  as prescribed by his nephrologist. -Continue Jardiance  10mg  -Trend BMPs   #HTN BP elevated on admission to 155/81. Had not taken any of his home medications but will restart while admitted and monitor BP. Consider adjusting regimen in the outpatient setting. -Continue Carvedilol  12.5mg  daily -Continue Losartan  100mg  daily -Continue Hydralazine  50mg  daily -Continue Furosemide  80mg  daily   #T2DM A1C 6.9 on 11/23/23. Glucose 113 on admission, will likely not require active management. Taking Jardiance  and Rosuvastatin . -Continue Jardiance  10mg    #HLD -continue Ezetimibe  10mg  -continue Rosuvastatin  40mg    #HFpEF Echo 03/16/22 showed grade II diastolic dysfunction with normal ejection fraction. No volume overloaded at this time. Afterload reduction during hospitalization. Will continue home meds. -Continue Carvedilol  12.5mg  -Continue Losartan  100mg  -Continue Hydralazine  50mg  -Continue Furosemide  80mg  daily    #BPH -Continue home Finasteride  5mg  daily   #GERD Home Omeprazole  changed to Pantoprazole  while inpatient. -Pantoprazole  80mg  daily   #Constipation Last BM 3 days ago. -Miralax    #Tobacco use -nicotine  patch while inpatient   #Peripheral neuropathy -Continue home med Gabapentin  300mg  BID   #COPD Home medicine Spiriva  -Incruse ellipta  while in hospital  Best Practice: Diet: Regular diet IVF: Fluids: None, Rate: None VTE: enoxaparin  (LOVENOX ) injection 30 mg Start: 12/13/23 1000 Code: Full  Disposition planning: Therapy Recs: pending, DME: pending Family Contact: Jacub Waiters, daughter [phone]),  at bedside DISPO: Anticipated discharge in 1-2 days to Home pending clinical improvement.  Signature:  Letha Cheadle, MD [place] [facility]  PGY-1 12/13/2023, 6:08 PM  On Call pager [phone]

## 2023-12-13 NOTE — ED Notes (Signed)
 Internal medicine at bedside

## 2023-12-14 ENCOUNTER — Other Ambulatory Visit (HOSPITAL_COMMUNITY): Payer: Self-pay

## 2023-12-14 LAB — CBC WITH DIFFERENTIAL/PLATELET
Abs Immature Granulocytes: 0.02 K/uL (ref 0.00–0.07)
Basophils Absolute: 0.1 K/uL (ref 0.0–0.1)
Basophils Relative: 1 %
Eosinophils Absolute: 0.3 K/uL (ref 0.0–0.5)
Eosinophils Relative: 4 %
HCT: 29.3 % — ABNORMAL LOW (ref 39.0–52.0)
Hemoglobin: 9.3 g/dL — ABNORMAL LOW (ref 13.0–17.0)
Immature Granulocytes: 0 %
Lymphocytes Relative: 24 %
Lymphs Abs: 1.4 K/uL (ref 0.7–4.0)
MCH: 28.8 pg (ref 26.0–34.0)
MCHC: 31.7 g/dL (ref 30.0–36.0)
MCV: 90.7 fL (ref 80.0–100.0)
Monocytes Absolute: 0.9 K/uL (ref 0.1–1.0)
Monocytes Relative: 15 %
Neutro Abs: 3.2 K/uL (ref 1.7–7.7)
Neutrophils Relative %: 56 %
Platelets: 191 K/uL (ref 150–400)
RBC: 3.23 MIL/uL — ABNORMAL LOW (ref 4.22–5.81)
RDW: 14 % (ref 11.5–15.5)
WBC: 5.8 K/uL (ref 4.0–10.5)
nRBC: 0 % (ref 0.0–0.2)

## 2023-12-14 LAB — BASIC METABOLIC PANEL WITH GFR
Anion gap: 9 (ref 5–15)
BUN: 28 mg/dL — ABNORMAL HIGH (ref 8–23)
CO2: 23 mmol/L (ref 22–32)
Calcium: 9.4 mg/dL (ref 8.9–10.3)
Chloride: 106 mmol/L (ref 98–111)
Creatinine, Ser: 2.76 mg/dL — ABNORMAL HIGH (ref 0.61–1.24)
GFR, Estimated: 24 mL/min — ABNORMAL LOW (ref >60)
Glucose, Bld: 116 mg/dL — ABNORMAL HIGH (ref 70–99)
Potassium: 3.8 mmol/L (ref 3.5–5.1)
Sodium: 138 mmol/L (ref 135–145)

## 2023-12-14 LAB — GLUCOSE, CAPILLARY
Glucose-Capillary: 114 mg/dL — ABNORMAL HIGH (ref 70–99)
Glucose-Capillary: 148 mg/dL — ABNORMAL HIGH (ref 70–99)

## 2023-12-14 MED ORDER — METHOCARBAMOL 500 MG PO TABS: 500.0000 mg | ORAL_TABLET | Freq: Three times a day (TID) | ORAL | 0 refills | Status: DC | PRN

## 2023-12-14 MED ORDER — ACETAMINOPHEN 500 MG PO TABS: 1000.0000 mg | ORAL_TABLET | Freq: Four times a day (QID) | ORAL | Status: DC | PRN

## 2023-12-14 MED ORDER — METHOCARBAMOL 500 MG PO TABS
500.0000 mg | ORAL_TABLET | Freq: Three times a day (TID) | ORAL | 0 refills | Status: DC | PRN
  Filled 2023-12-14: qty 30, 10d supply, fill #0

## 2023-12-14 MED ORDER — ACETAMINOPHEN 500 MG PO TABS
1000.0000 mg | ORAL_TABLET | Freq: Four times a day (QID) | ORAL | 0 refills | Status: AC | PRN
  Filled 2023-12-14: qty 30, 4d supply, fill #0

## 2023-12-14 MED ORDER — ACETAMINOPHEN 500 MG PO TABS: 1000.0000 mg | ORAL_TABLET | Freq: Four times a day (QID) | ORAL | 0 refills | Status: DC | PRN

## 2023-12-14 MED ORDER — NYSTATIN 100000 UNIT/ML MT SUSP
5.0000 mL | Freq: Four times a day (QID) | OROMUCOSAL | 0 refills | Status: DC
Start: 2023-12-14 — End: 2023-12-14

## 2023-12-14 MED ORDER — CEPHALEXIN 500 MG PO CAPS
500.0000 mg | ORAL_CAPSULE | Freq: Four times a day (QID) | ORAL | 0 refills | Status: AC
  Filled 2023-12-14: qty 56, 14d supply, fill #0

## 2023-12-14 MED ORDER — CEPHALEXIN 500 MG PO CAPS: 500.0000 mg | ORAL_CAPSULE | Freq: Four times a day (QID) | ORAL | 0 refills | Status: DC

## 2023-12-14 MED ORDER — NYSTATIN 100000 UNIT/ML MT SUSP
5.0000 mL | Freq: Four times a day (QID) | OROMUCOSAL | 0 refills | Status: AC
  Filled 2023-12-14: qty 60, 3d supply, fill #0

## 2023-12-14 NOTE — Discharge Instructions (Addendum)
 To Seth Santana Needles or their caretakers,  You were recently admitted to [facility] for a soft tissue infection of the neck. You are already on the correct antibiotic treatments for your condition. We have given you some pain medications and muscle relaxants to help make you more comfortable. Your neck pain and stiffness may take several weeks to fully heal.  Continue taking your home medications with the following changes:  Start taking Cephalexin  (Keflex ) 500 mg by mouth every 6 hours (4 times daily) for 14 days Methocarbamol  (Robaxin ) 500 mg by mouth every 8 hours as needed for muscle spasms Nystatin  (Mycostatin ) 100,000 unit/mL suspension - take 5 mL by mouth 4 times daily Stop taking Ferrous sulfate  325 (65 Fe) milligram EC tablet Continue taking Aspirin  EC 81 mg tablet daily Augmented Betamethasone dipropionate (Diprolene-AF) 0.05% cream as needed for dry skin Cyanocobalamine (B-12 p.o.) daily as needed Carvedilol  (Coreg ) 12.5 mg tablet - take 1 tablet by mouth 2 times daily Ezetimibe  (Zetia ) 10 mg daily Fiber p.o. as needed for constipation Finasteride  (Proscar ) 5 mg daily Furosemide  (Lasix ) 80 mg daily Gabapentin  (Neurontin ) 300 mg twice daily Hydralazine  (Apresoline ) 50 mg in the morning and at bedtime Hydroxyzine  (Atarax ) 10 mg every 8 hours as needed for itching Empagliflozin  (Jardiance ) 10 mg daily Losartan  (Cozaar ) 100 mg daily Multivitamins daily Omeprazole  (Prilosec) 40 mg daily Rosuvastatin  (Crestor ) 40 mg daily Tia Tropium bromide monohydrate (Spiriva  Respimat) 2.5 mcg/ACT - 2 inhalations by mouth once daily Triamcinolone  ointment (Kenalog ) 0.1% as needed for itching   You should seek further medical care if your neck pain/stiffness worsens, you experience a fever, or start having chest pain, shortness of breath, facial droop, one-sided weakness.  We have also scheduled you an appointment with Dr. Lonni Africa on 12/21/23 at 9:15am. We are so glad that  you are feeling better.  Sincerely,  [facility]

## 2023-12-14 NOTE — Discharge Summary (Signed)
 Name: Seth Santana MRN: [id number] DOB: Mar 23, 1951 73 y.o. PCP: Renne Homans, MD  Date of Admission: 12/12/2023  5:26 PM Date of Discharge: 12/14/2023  Attending Physician: Dr. Reyes Fenton  Discharge Diagnosis: Principal Problem:   Nuchal rigidity Prevertebral soft tissue infection Sterile pyuria CKD 3B HTN T2DM HLD HFpEF BPH GERD Constipation Tobacco use disorder Peripheral neuropathy COPD Anemia  Discharge Medications: Allergies as of 12/14/2023   No Known Allergies      Medication List     PAUSE taking these medications    ferrous sulfate  325 (65 FE) MG EC tablet Wait to take this until your doctor or other care provider tells you to start again. Take 1 tablet (325 mg total) by mouth 2 (two) times daily.       TAKE these medications    Acetaminophen  Extra Strength 500 MG Tabs Take 2 tablets (1,000 mg total) by mouth every 6 (six) hours as needed for mild pain (pain score 1-3) or moderate pain (pain score 4-6). What changed:  how much to take reasons to take this   aspirin  EC 81 MG tablet Take 1 tablet (81 mg total) by mouth daily. Swallow whole.   augmented betamethasone dipropionate 0.05 % cream Commonly known as: DIPROLENE-AF Apply 1 Application topically as needed (dry skin).   B-12 PO Take 1 tablet by mouth daily as needed (energy).   carvedilol  12.5 MG tablet Commonly known as: COREG  Take 1 tablet (12.5 mg total) by mouth 2 (two) times daily with a meal.   cephALEXin  500 MG capsule Commonly known as: KEFLEX  Take 1 capsule (500 mg total) by mouth 4 (four) times daily for 14 days.   ezetimibe  10 MG tablet Commonly known as: ZETIA  TAKE 1 TABLET BY MOUTH DAILY   FIBER PO Take 1 tablet by mouth daily as needed (constipation).   finasteride  5 MG tablet Commonly known as: PROSCAR  TAKE 1 TABLET BY MOUTH DAILY   furosemide  80 MG tablet Commonly known as: LASIX  TAKE 1 TABLET BY MOUTH DAILY IN  THE MORNING   gabapentin  300 MG  capsule Commonly known as: NEURONTIN  TAKE 1 CAPSULE BY MOUTH TWICE  DAILY   hydrALAZINE  50 MG tablet Commonly known as: APRESOLINE  TAKE 1 TABLET BY MOUTH IN THE  MORNING AND AT BEDTIME   hydrOXYzine  10 MG tablet Commonly known as: ATARAX  TAKE 1 TABLET BY MOUTH EVERY 8  HOURS AS NEEDED FOR ITCHING   Jardiance  10 MG Tabs tablet Generic drug: empagliflozin  TAKE 1 TABLET BY MOUTH DAILY  BEFORE BREAKFAST   losartan  100 MG tablet Commonly known as: COZAAR  TAKE 1 TABLET BY MOUTH DAILY   Mens 50+ Multivitamin Tabs Take 1 tablet by mouth daily.   methocarbamol  500 MG tablet Commonly known as: ROBAXIN  Take 1 tablet (500 mg total) by mouth every 8 (eight) hours as needed for muscle spasms.   nystatin  100000 UNIT/ML suspension Commonly known as: MYCOSTATIN  Take 5 mLs (500,000 Units total) by mouth 4 (four) times daily.   omeprazole  40 MG capsule Commonly known as: PRILOSEC TAKE 1 CAPSULE BY MOUTH DAILY   rosuvastatin  40 MG tablet Commonly known as: CRESTOR  TAKE 1 TABLET BY MOUTH DAILY What changed: when to take this   Spiriva  Respimat 2.5 MCG/ACT Aers Generic drug: Tiotropium Bromide  Monohydrate USE 2 INHALATIONS BY MOUTH ONCE  DAILY   triamcinolone  ointment 0.1 % Commonly known as: KENALOG  APPLY TOPICALLY TO AFFECTED  AREA(S) TWICE DAILY AS NEEDED  FOR ITCHING        Disposition  and follow-up:   Mr.Kylor HENRYK URSIN was discharged from [facility] in Good condition.  At the hospital follow up visit please address:  1.  Follow-up:   a.  Prevertebral soft tissue infection - please ensure that the patient is taking his Keflex  4 times daily and finishing a 2-week course of this.  Assess for worsening neck pain, fever, or other signs of meningitis.   2.  Labs / imaging needed at time of follow-up: None  3.  Pending labs/ test needing follow-up: CSF and blood culture still pending   Follow-up Appointments:  Follow-up Information     Harrie Bruckner, DO Follow up on 12/21/2023.   Specialty: Internal Medicine Why: You have an appointment scheduled for 9:15am with Dr. Bruckner Harrie Contact information: [address] [place], [address] [place] [address] [phone]                 Hospital Course by problem list: #Prevertebral Soft Tissue Infection Patient presented to the ED with acute on chronic neck pain and stiffness with fever at home and nuchal rigidity + Brudzinski's sign on exam. VSS were stable during admission. CT head was negative for acute findings.  He was initially started on vancomycin , ceftriaxone , and ampicillin  as precautionary measures given meningitis was on the differential.  CSF studies from [facility] were within normal limits and white count was stable. RPR negative. MRI findings included mild prevertebral soft tissue edema around C2-C4 which were concerning for infection.  After MRI results, we de-escalated to ceftriaxone .  We did not think he needed MRSA coverage given he is not immunocompromised and does not use IVDU.  He had major clinical improvement including improved range of motion of neck on exam during discharge.  Upon discharge we transitioned the patient to p.o. Keflex  every 6 for 14 days.   #Sterile Pyruia Patient did not report any dysuria or urinary frequency.  His urinalysis was concerning for pyuria but urine culture with multiple organisms which was likely due to contamination.  He is already receiving antibiotics for prevertebral soft tissue infection which would likely cover any UTI as well.   Chronic conditions without active management   #Chronic kidney disease, stage 3b (HCC) Creatinine 2.23 on admission, similar to baseline when reviewing [facility] data. Below recent Creatinine of 3.09 when AKI was suspected. Is seen by a nephrologist outside of [facility] system.  We continued his home Jardiance  10 mg as prescribed by his nephrologist.  On day of discharge his creatinine was stable at  2.76 likely increasing from poor p.o. intake.    #HTN BP elevated on admission to 155/81. Had not taken any of his home medications but we restarted these and continue to monitor his blood pressure. Consider adjusting regimen in the outpatient setting.   #T2DM A1C 6.9 on 11/23/23. Glucose 113 on admission, will likely not require active management. Taking Jardiance  and Rosuvastatin .  #HLD -continue Ezetimibe  10mg  -continue Rosuvastatin  40mg    #HFpEF Echo 03/16/22 showed grade II diastolic dysfunction with normal ejection fraction. No volume overloaded during this admission. Afterload reduction during hospitalization.  Continue his home carvedilol  12.5 mg, losartan  100 mg, hydralazine  50 mg, furosemide  80 mg.   #BPH No concerns during this admission.  We continued his home Finasteride  5mg  daily.   #GERD Home Omeprazole  changed to Pantoprazole  while inpatient.  We continued his home omeprazole  upon discharge.   #Tobacco use He received a nicotine  patch while in the hospital.   #Peripheral neuropathy We continued  his home med Gabapentin  300mg  BID.   #COPD Home medicine Spiriva  but this was on formulary so had to use Incruse Ellipta  while in the hospital.   Discharge Subjective: Mr.Daimian ADONYS WILDES reports still feeling neck pain. Thinks this may have been because he was sleeping in an odd position. Overall feeling better than when he came into the hospital and is ready to go home today. Patient is medically ready for discharge.  Discharge Exam:   BP (!) 154/76 (BP Location: Left Arm)   Pulse 72   Temp 98.5 F (36.9 C) (Oral)   Resp 16   Ht 5' 9 (1.753 m)   Wt 96.3 kg   SpO2 99%   BMI 31.35 kg/m  Physical Exam: Constitutional: well-appearing, well-nourished, in no acute distress HENT: normocephalic atraumatic, mucous membranes moist; unable to fully evaluate due to limited jaw opening due to the pain Eyes: conjunctiva non-erythematous, PERRL, no scleral icterus Neck: Neck  rotation and lateral flexion with reduced range of motion due to pain; mildly TTP of posterior and lateral cervical and paraspinal muscles Cardiovascular: regular rate and rhythm, no m/r/g Pulmonary/Chest: normal work of breathing on room air, lungs clear to auscultation bilaterally Abdominal: soft, non-tender, non-distended, bowel sounds normal MSK: normal bulk and tone Neurological: alert & oriented x3 Skin: warm and dry Extremities: no edema or cyanosis; peripheral pulses intact Psych: normal mood and affect, thought content normal   Pertinent Labs, Studies, and Procedures:     Latest Ref Rng & Units 12/14/2023    4:29 AM 12/13/2023    6:41 AM 12/12/2023    6:45 PM  CBC  WBC 4.0 - 10.5 K/uL 5.8  7.3    Hemoglobin 13.0 - 17.0 g/dL 9.3  9.8  88.7   Hematocrit 39.0 - 52.0 % 29.3  31.1  33.0   Platelets 150 - 400 K/uL 191  177         Latest Ref Rng & Units 12/14/2023    4:29 AM 12/13/2023    6:41 AM 12/12/2023    6:45 PM  CMP  Glucose 70 - 99 mg/dL 883  890  886   BUN 8 - 23 mg/dL 28  25  24    Creatinine 0.61 - 1.24 mg/dL 7.23  7.84  7.59   Sodium 135 - 145 mmol/L 138  142  142   Potassium 3.5 - 5.1 mmol/L 3.8  4.0  4.5   Chloride 98 - 111 mmol/L 106  106  109   CO2 22 - 32 mmol/L 23  23    Calcium  8.9 - 10.3 mg/dL 9.4  9.6      MR NECK SOFT TISSUE ONLY WO CONTRAST Result Date: 12/13/2023 EXAM: MR NECK WITHOUT INTRAVENOUS CONTRAST 12/13/2023 05:53:59 AM TECHNIQUE: Multiplanar multisequence MRI of the neck was performed without the administration of intravenous contrast. COMPARISON: None available. CLINICAL HISTORY: Soft tissue infection suspected, neck, xray done. Soft tissue infection suspected, neck. FINDINGS: PHARYNX AND LARYNX: The palatine tonsils are normal in appearance. The tongue is normal in appearance. The valleculae, epiglottis, aryepiglottic folds and pyriform sinuses appear unremarkable. The true and false vocal cords are normal in appearance. No mass or abscess is seen.  SALIVARY GLANDS: The parotid and submandibular glands appear unremarkable. THYROID : The thyroid  gland appears unremarkable. LYMPH NODES: No cervical or supraclavicular lymphadenopathy is seen. SOFT TISSUES: There is mild prevertebral soft tissue edema from C2 through C4. BRAIN, ORBITS AND SINUSES: The visualized portion of the intracranial contents appear unremarkable. The visualized portion  of the orbits, paranasal sinuses and mastoid air cells demonstrate no acute abnormality. BONES: There is diffuse degenerative disc disease throughout the cervical spine. There is mild reversal of the normal cervical lordosis. There are anterior osteophytes present at C2-3 and C4-5. There is moderate central spinal canal stenosis at C4-5 secondary to posterior disc bulging and endplate ridging. IMPRESSION: 1. Mild prevertebral soft tissue edema from C2 through C4, possibly related to the suspected soft tissue infection. 2. Diffuse degenerative disc disease throughout the cervical spine with moderate central spinal canal stenosis at C4-5 secondary to posterior disc bulging and endplate ridging. There is no evidence of diskitis or osteomyelitis. Electronically signed by: Evalene Coho MD 12/13/2023 06:14 AM EDT RP Workstation: [id number]   CT Head Wo Contrast Result Date: 12/13/2023 CLINICAL DATA:  Neck pain with epiglottitis or tonsillitis suspected. Concern for retropharyngeal abscess. Headache and failure to thrive with weakness and fever. EXAM: CT HEAD WITHOUT CONTRAST CT CERVICAL SPINE WITHOUT CONTRAST TECHNIQUE: Multidetector CT imaging of the head and cervical spine was performed following the standard protocol without intravenous contrast. Multiplanar CT image reconstructions of the cervical spine were also generated. RADIATION DOSE REDUCTION: This exam was performed according to the departmental dose-optimization program which includes automated exposure control, adjustment of the mA and/or kV according to patient  size and/or use of iterative reconstruction technique. COMPARISON:  CT scan head and cervical spine both 02/08/2013. No studies that are more recent. FINDINGS: CT HEAD FINDINGS Brain: There is moderate for age cerebral cortical volume loss, mild small vessel disease and atrophic ventriculomegaly and mild cerebellar atrophy. No acute infarct, hemorrhage or mass are seen. There is no midline shift. Basal cisterns are clear. Vascular: Moderate calcific plaque again noted both carotid siphons. No hyperdense central vessels. Calcific plaque in both distal vertebral arteries as well. Skull: Negative for fractures or focal lesions. Sinuses/Orbits: No acute finding. Other: None. CT CERVICAL SPINE FINDINGS Alignment: Straightened and slightly reversed cervical lordosis centered at C4, similar to the previous exam. Bone-on-bone anterior atlantodental joint space loss and prominent osteophytes are again noted, unchanged. No traumatic alignment abnormality or listhesis is seen. Skull base and vertebrae: No acute fracture. No primary bone lesion or focal pathologic process. There is osteopenia. 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 There is at least partial facet joint ankylosis at C5-6. Other facets appear to be patent. Posterior osteophytes are present at most levels but are fairly small for the most part. Soft tissues and spinal canal: No prevertebral fluid or swelling. No visible canal hematoma. Lobular prominence is seen along the arytenoid eminence, but it also was enlarged in 2014 and has not significantly changed. The airway is narrowed to 9 x 7 mm at this level. The epiglottis is unremarkable. Vocal folds are symmetric. There does appear to be mild thickening of the pharyngeal membranes but no underlying abscess. The tonsils are not enlarged. Disc levels:  The discs are diffusely degenerated. There is progressive disc collapse at C7-T1 with progressive disc space loss at this level as well as at C2-3, C3-4 and C4-5, since 2014. At C4-5, there is a posterior disc protrusion slightly compressing the cord, with 7 mm AP thecal sac. Posterior osteophytes at C6-7 at C7-T1 again causing borderline mass effect on the cord. Left-greater-than-right facet hypertrophy with uncinate joint spurring multiple levels. Acquired foraminal stenosis is moderate but  the left at C3-4, severe on the left and moderate to severe on the right at C3-4, bilaterally mild at C4-5, and bilaterally mild-to-moderate at C7-T1. Upper chest: Negative. Other: None. IMPRESSION: 1. No acute intracranial CT findings or depressed skull fractures. 2. Atrophy and small-vessel disease with a mild progression since 2014. 3. Osteopenia and degenerative change of the cervical spine without evidence of fractures or traumatic listhesis. 4. Bulky anterior osteophytes C3-7, with attempted interbody bridging C2-3, C3-4 and C4-5 and with interbody ankylosis at C5-6 and C6-7. 5. Posterior disc protrusion at C4-5 slightly compressing the cord, with 7 mm AP thecal sac. 6. Posterior osteophytes at C6-7 and C7-T1 again causing borderline mass effect on the cord. 7. Lobular prominence of the arytenoid eminence, but it also was enlarged in 2014 and has not significantly changed. The airway is narrowed to 9 x 7 mm at this level. 8. Mild thickening of the pharyngeal membranes but no underlying abscess. Electronically Signed   By: Francis Quam M.D.   On: 12/13/2023 01:09   CT C-SPINE NO CHARGE Result Date: 12/13/2023 CLINICAL DATA:  Neck pain with epiglottitis or tonsillitis suspected. Concern for retropharyngeal abscess. Headache and failure to thrive with weakness and fever. EXAM: CT HEAD WITHOUT CONTRAST CT CERVICAL SPINE WITHOUT CONTRAST TECHNIQUE: Multidetector CT imaging of the head and cervical spine was performed  following the standard protocol without intravenous contrast. Multiplanar CT image reconstructions of the cervical spine were also generated. RADIATION DOSE REDUCTION: This exam was performed according to the departmental dose-optimization program which includes automated exposure control, adjustment of the mA and/or kV according to patient size and/or use of iterative reconstruction technique. COMPARISON:  CT scan head and cervical spine both 02/08/2013. No studies that are more recent. FINDINGS: CT HEAD FINDINGS Brain: There is moderate for age cerebral cortical volume loss, mild small vessel disease and atrophic ventriculomegaly and mild cerebellar atrophy. No acute infarct, hemorrhage or mass are seen. There is no midline shift. Basal cisterns are clear. Vascular: Moderate calcific plaque again noted both carotid siphons. No hyperdense central vessels. Calcific plaque in both distal vertebral arteries as well. Skull: Negative for fractures or focal lesions. Sinuses/Orbits: No acute finding. Other: None. CT CERVICAL SPINE FINDINGS Alignment: Straightened and slightly reversed cervical lordosis centered at C4, similar to the previous exam. Bone-on-bone anterior atlantodental joint space loss and prominent osteophytes are again noted, unchanged. No traumatic alignment abnormality or listhesis is seen. Skull base and vertebrae: No acute fracture. No primary bone lesion or focal pathologic process. There is osteopenia. 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 There is at least partial facet joint ankylosis at C5-6. Other facets appear to be patent. Posterior osteophytes are present at most levels but are fairly small for the most part. Soft tissues and spinal canal: No prevertebral fluid or swelling. No visible canal hematoma. Lobular prominence is seen  along the arytenoid eminence, but it also was enlarged in 2014 and has not significantly changed. The airway is narrowed to 9 x 7 mm at this level. The epiglottis is unremarkable. Vocal folds are symmetric. There does appear to be mild thickening of the pharyngeal membranes but no underlying abscess. The tonsils are not enlarged. Disc levels: The discs are diffusely degenerated. There is progressive disc collapse at C7-T1 with progressive disc space loss at this level as well as at  C2-3, C3-4 and C4-5, since 2014. At C4-5, there is a posterior disc protrusion slightly compressing the cord, with 7 mm AP thecal sac. Posterior osteophytes at C6-7 at C7-T1 again causing borderline mass effect on the cord. Left-greater-than-right facet hypertrophy with uncinate joint spurring multiple levels. Acquired foraminal stenosis is moderate but the left at C3-4, severe on the left and moderate to severe on the right at C3-4, bilaterally mild at C4-5, and bilaterally mild-to-moderate at C7-T1. Upper chest: Negative. Other: None. IMPRESSION: 1. No acute intracranial CT findings or depressed skull fractures. 2. Atrophy and small-vessel disease with a mild progression since 2014. 3. Osteopenia and degenerative change of the cervical spine without evidence of fractures or traumatic listhesis. 4. Bulky anterior osteophytes C3-7, with attempted interbody bridging C2-3, C3-4 and C4-5 and with interbody ankylosis at C5-6 and C6-7. 5. Posterior disc protrusion at C4-5 slightly compressing the cord, with 7 mm AP thecal sac. 6. Posterior osteophytes at C6-7 and C7-T1 again causing borderline mass effect on the cord. 7. Lobular prominence of the arytenoid eminence, but it also was enlarged in 2014 and has not significantly changed. The airway is narrowed to 9 x 7 mm at this level. 8. Mild thickening of the pharyngeal membranes but no underlying abscess. Electronically Signed   By: Francis Quam M.D.   On: 12/13/2023 01:09   CT Soft Tissue  Neck Wo Contrast Result Date: 12/13/2023 EXAM: CT NECK WITHOUT CONTRAST 12/13/2023 12:21:30 AM TECHNIQUE: CT of the neck was performed without the administration of intravenous contrast. Multiplanar reformatted images are provided for review. Automated exposure control, iterative reconstruction, and/or weight based adjustment of the mA/kV was utilized to reduce the radiation dose to as low as reasonably achievable. COMPARISON: None available. CLINICAL HISTORY: Epiglottitis or tonsillitis suspected; neck pain, concern for RPA. Headache, Epiglottitis or tonsillitis suspected, neck pain, concern for RPA, Failure To Thrive; Weakness; Fever. FINDINGS: AERODIGESTIVE TRACT: No discrete mass. No edema. SALIVARY GLANDS: The parotid and submandibular glands are unremarkable. THYROID : Unremarkable. LYMPH NODES: No suspicious cervical lymphadenopathy. SOFT TISSUES: No mass or fluid collection. BRAIN, ORBITS, SINUSES AND MASTOIDS: No acute abnormality. LUNGS AND MEDIASTINUM: No acute abnormality. BONES: No focal bone abnormality. Multilevel degenerative disc disease and anterior osteophytosis. IMPRESSION: 1. No acute findings or discrete mass in the neck. Electronically signed by: Franky Stanford MD 12/13/2023 12:50 AM EDT RP Workstation: [id number]   DG Chest 2 View Result Date: 12/12/2023 CLINICAL DATA:  Fever. EXAM: CHEST - 2 VIEW COMPARISON:  Chest radiograph dated 03/16/2022. FINDINGS: No focal consolidation, pleural effusion pneumothorax. Mild cardiomegaly. No acute osseous pathology. IMPRESSION: 1. No active cardiopulmonary disease. 2. Mild cardiomegaly. Electronically Signed   By: Vanetta Chou M.D.   On: 12/12/2023 20:06     Discharge Instructions:   Discharge Instructions      To JADA KUHNERT or their caretakers,  You were recently admitted to [facility] for a soft tissue infection of the neck. You are already on the correct antibiotic treatments for your condition. We have given you some pain  medications and muscle relaxants to help make you more comfortable. Your neck pain and stiffness may take several weeks to fully heal.  Continue taking your home medications with the following changes:  Start taking Cephalexin  (Keflex ) 500 mg by mouth every 6 hours (4 times daily) for 14 days Methocarbamol  (Robaxin ) 500 mg by mouth every 8 hours as needed for muscle spasms Nystatin  (Mycostatin ) 100,000 unit/mL suspension - take 5 mL by mouth 4 times  daily Stop taking Ferrous sulfate  325 (65 Fe) milligram EC tablet Continue taking Aspirin  EC 81 mg tablet daily Augmented Betamethasone dipropionate (Diprolene-AF) 0.05% cream as needed for dry skin Cyanocobalamine (B-12 p.o.) daily as needed Carvedilol  (Coreg ) 12.5 mg tablet - take 1 tablet by mouth 2 times daily Ezetimibe  (Zetia ) 10 mg daily Fiber p.o. as needed for constipation Finasteride  (Proscar ) 5 mg daily Furosemide  (Lasix ) 80 mg daily Gabapentin  (Neurontin ) 300 mg twice daily Hydralazine  (Apresoline ) 50 mg in the morning and at bedtime Hydroxyzine  (Atarax ) 10 mg every 8 hours as needed for itching Empagliflozin  (Jardiance ) 10 mg daily Losartan  (Cozaar ) 100 mg daily Multivitamins daily Omeprazole  (Prilosec) 40 mg daily Rosuvastatin  (Crestor ) 40 mg daily Tia Tropium bromide monohydrate (Spiriva  Respimat) 2.5 mcg/ACT - 2 inhalations by mouth once daily Triamcinolone  ointment (Kenalog ) 0.1% as needed for itching   You should seek further medical care if your neck pain/stiffness worsens, you experience a fever, or start having chest pain, shortness of breath, facial droop, one-sided weakness.  We have also scheduled you an appointment with Dr. Lonni Africa on 12/21/23 at 9:15am. We are so glad that you are feeling better.  Sincerely,  [facility]      Signed:  Letha Cheadle, MD Internal Medicine Resident, PGY-1 12/14/2023, 3:42 PM Please contact the on call pager after 5 pm and on weekends at  [phone].

## 2023-12-14 NOTE — Evaluation (Signed)
 Physical Therapy Evaluation Patient Details Name: Seth Santana MRN: [id number] DOB: [birth year] Today's Date: 12/14/2023  History of Present Illness  Pt is a 73 y/o M admitted on 12/12/23 after presenting with acute neck pain & stiffness. Pt is being treated for prevertebral soft tissue infection. PMH: HFpEF, CKD, gout, DM2, HTN, peripheral neuropathy, COPD, tobacco use  Clinical Impression  Pt seen for PT evaluation with pt agreeable, daughter present. Pt HOH without hearing aides so daughter Seth Santana) providing PLOF/home set up information. Prior to admission pt was independent without AD, recently started using SPC 2/2 increasing neck pain, as well as beginning to sleep in recliner. On this date, pt is able to complete bed mobility with mod I, sit>stand with mod I, & ambulate around unit without AD with mod I with gait as noted below. Pt with no cervical ROM noted; PT encouraged/educated daughter on pt's need to perform gentle cervical ROM as tolerated to prevent stiffness. At this time, pt appears to be at baseline level of mobility & does not require acute PT services. PT to complete current orders. Please re-consult if new needs arise.        If plan is discharge home, recommend the following: Assist for transportation   Can travel by private vehicle        Equipment Recommendations None recommended by PT  Recommendations for Other Services       Functional Status Assessment Patient has not had a recent decline in their functional status     Precautions / Restrictions Precautions Precautions: None Restrictions Weight Bearing Restrictions Per Provider Order: No      Mobility  Bed Mobility Overal bed mobility: Modified Independent Bed Mobility: Supine to Sit     Supine to sit: HOB elevated, Modified independent (Device/Increase time) (exit R side of bed)          Transfers Overall transfer level: Modified independent Equipment used: None               General  transfer comment: sit>stand from EOB without AD    Ambulation/Gait Ambulation/Gait assistance: Modified independent (Device/Increase time) Gait Distance (Feet): 110 Feet Assistive device: None Gait Pattern/deviations: Decreased step length - left, Decreased step length - right, Decreased dorsiflexion - right, Decreased dorsiflexion - left, Decreased stride length Gait velocity: decreased     General Gait Details: decreased heel strike, pt holding B hands in front of him with daughter reporting this is baseline, no LOB  Stairs            Wheelchair Mobility     Tilt Bed    Modified Rankin (Stroke Patients Only)       Balance Overall balance assessment: Mild deficits observed, not formally tested, Needs assistance   Sitting balance-Leahy Scale: Good     Standing balance support: During functional activity, No upper extremity supported Standing balance-Leahy Scale: Good Standing balance comment: standing void & hand hygiene without LOB                             Pertinent Vitals/Pain Pain Assessment Pain Assessment: 0-10 Pain Score: 8  Pain Location: neck Pain Descriptors / Indicators: Discomfort Pain Intervention(s): Monitored during session, Premedicated before session    Home Living Family/patient expects to be discharged to:: Private residence Living Arrangements: Children Available Help at Discharge: Family;Available 24 hours/day (daughter Seth Santana) works from home) Type of Home: House Home Access: Stairs to enter Entrance Stairs-Rails: Right (rail on  R, side of house on L) Entrance Stairs-Number of Steps: 4-5 at side door   Home Layout: One level Home Equipment: Cane - single point      Prior Function               Mobility Comments: Ambulatory without AD, recently started using SPC 2/2 neck pain, no falls ADLs Comments: no assist for bathing, dressing     Extremity/Trunk Assessment   Upper Extremity Assessment Upper Extremity  Assessment: Overall WFL for tasks assessed    Lower Extremity Assessment Lower Extremity Assessment: Overall WFL for tasks assessed    Cervical / Trunk Assessment Cervical / Trunk Assessment:  (no cervical ROM noted, pt turns body vs attempting to rotate head, no flexion/extension noted either)  Communication   Communication Communication: Impaired Factors Affecting Communication: Hearing impaired    Cognition Arousal: Alert Behavior During Therapy: WFL for tasks assessed/performed   PT - Cognitive impairments: Difficult to assess Difficult to assess due to: Hard of hearing/deaf                       Following commands: Intact       Cueing Cueing Techniques: Verbal cues     General Comments      Exercises     Assessment/Plan    PT Assessment Patient does not need any further PT services  PT Problem List         PT Treatment Interventions      PT Goals (Current goals can be found in the Care Plan section)  Acute Rehab PT Goals Patient Stated Goal: decreased neck pain, go home PT Goal Formulation: With patient Time For Goal Achievement: 12/28/23 Potential to Achieve Goals: Good    Frequency       Co-evaluation               AM-PAC PT 6 Clicks Mobility  Outcome Measure Help needed turning from your back to your side while in a flat bed without using bedrails?: None Help needed moving from lying on your back to sitting on the side of a flat bed without using bedrails?: None Help needed moving to and from a bed to a chair (including a wheelchair)?: None Help needed standing up from a chair using your arms (e.g., wheelchair or bedside chair)?: None Help needed to walk in hospital room?: None Help needed climbing 3-5 steps with a railing? : None 6 Click Score: 24    End of Session   Activity Tolerance: Patient tolerated treatment well Patient left: with family/visitor present (sitting on couch in room, daughter present to assist)         Time: 9052-9040 PT Time Calculation (min) (ACUTE ONLY): 12 min   Charges:   PT Evaluation $PT Eval Low Complexity: 1 Low   PT General Charges $$ ACUTE PT VISIT: 1 Visit         [name], PT, DPT 12/14/23, 10:09 AM   [name] 12/14/2023, 10:07 AM

## 2023-12-14 NOTE — Progress Notes (Signed)
 Discharge Nurse Summary: DC order noted per MD. DC RN at bedside with patient. Patient agreeable with discharge plan, states family will arrive soon for pickup.   AVS printed/reviewed. PIV removed, skin intact. No DME needs. No home meds. TOC meds delivered to the patient. CP/Edu resolved. Telemonitor not present on assessment. All belongings accounted for. Wound CDI w/o bleeding or drainage. See LDAs for more information. Patient wheeled downstairs for discharge by private auto.   Rosario EMERSON Lund, RN

## 2023-12-16 LAB — CSF CULTURE W GRAM STAIN
Culture: NO GROWTH
Gram Stain: NONE SEEN

## 2023-12-17 ENCOUNTER — Telehealth: Payer: Self-pay

## 2023-12-17 NOTE — Transitions of Care (Post Inpatient/ED Visit) (Signed)
 12/17/2023  Name: Seth Santana MRN: [id number] DOB: 1951-02-28  Today's TOC FU Call Status: Today's TOC FU Call Status:: Successful TOC FU Call Completed TOC FU Call Complete Date: 12/17/23 Patient's Name and Date of Birth confirmed.  Transition Care Management Follow-up Telephone Call Date of Discharge: 12/14/23 Discharge Facility: [facility]) Type of Discharge: Inpatient Admission Primary Inpatient Discharge Diagnosis:: Soft tissue neck infection How have you been since you were released from the hospital?: Same Any questions or concerns?: Yes Patient Questions/Concerns:: His neck still hurts Patient Questions/Concerns Addressed: Notified Provider of Patient Questions/Concerns  Items Reviewed: Did you receive and understand the discharge instructions provided?: Yes Medications obtained,verified, and reconciled?: Yes (Medications Reviewed) Any new allergies since your discharge?: No Dietary orders reviewed?: Yes Type of Diet Ordered:: Low sodium heart healthy Do you have support at home?: Yes People in Home [RPT]: child(ren), adult Name of Support/Comfort Primary Source: Jamie Severtson  Medications Reviewed Today: Medications Reviewed Today     Reviewed by Moises Reusing, RN (Case Manager) on 12/17/23 at 1001  Med List Status: <None>   Medication Order Taking? Sig Documenting Provider Last Dose Status Informant  acetaminophen  (TYLENOL ) 500 MG tablet [id number]  Take 2 tablets (1,000 mg total) by mouth every 6 (six) hours as needed for mild pain (pain score 1-3) or moderate pain (pain score 4-6). Waymond Cart, MD  Active   aspirin  EC 81 MG tablet [id number] No Take 1 tablet (81 mg total) by mouth daily. Swallow whole. Darron Deatrice LABOR, MD 12/09/2023 Active Child, Pharmacy Records  augmented betamethasone dipropionate (DIPROLENE-AF) 0.05 % cream [id number] No Apply 1 Application topically as needed (dry skin). [provider] Past Month Active Child, Pharmacy  Records           Med Note (LEE, NICOLE   Thu Dec 13, 2023  8:38 AM) No dispense information to support medication.  carvedilol  (COREG ) 12.5 MG tablet [id number] No Take 1 tablet (12.5 mg total) by mouth 2 (two) times daily with a meal. Renne Homans, MD 12/09/2023 Active Child, Pharmacy Records  cephALEXin  (KEFLEX ) 500 MG capsule [id number]  Take 1 capsule (500 mg total) by mouth 4 (four) times daily for 14 days. Waymond Cart, MD  Active   Cyanocobalamin  (B-12 PO) [id number] No Take 1 tablet by mouth daily as needed (energy). [provider] 12/09/2023 Active Child, Pharmacy Records  ezetimibe  (ZETIA ) 10 MG tablet [id number] No TAKE 1 TABLET BY MOUTH DAILY Chandrasekhar, Mahesh A, MD 12/09/2023 Active Child, Pharmacy Records  ferrous sulfate  325 (65 FE) MG EC tablet [id number] No Take 1 tablet (325 mg total) by mouth 2 (two) times daily. Atway, Rayann N, DO 12/09/2023 Active Child, Pharmacy Records  FIBER PO [id number] No Take 1 tablet by mouth daily as needed (constipation). [provider] Past Month Active Child, Pharmacy Records  finasteride  (PROSCAR ) 5 MG tablet [id number] No TAKE 1 TABLET BY MOUTH DAILY Atway, Rayann N, DO 12/09/2023 Active Child, Pharmacy Records  furosemide  (LASIX ) 80 MG tablet [id number] No TAKE 1 TABLET BY MOUTH DAILY IN  THE MORNING Santo Stanly LABOR, MD 12/09/2023 Active Child, Pharmacy Records  gabapentin  (NEURONTIN ) 300 MG capsule [id number] No TAKE 1 CAPSULE BY MOUTH TWICE  DAILY Atway, Rayann N, DO 12/09/2023 Active Child, Pharmacy Records  hydrALAZINE  (APRESOLINE ) 50 MG tablet [id number] No TAKE 1 TABLET BY MOUTH IN THE  MORNING AND AT BEDTIME Atway, Rayann N, DO 12/09/2023 Active Child, Pharmacy Records  hydrOXYzine  (ATARAX ) 10 MG tablet [id number] No TAKE 1 TABLET BY  MOUTH EVERY 8  HOURS AS NEEDED FOR ITCHING Atway, Rayann N, DO Past Week Active Child, Pharmacy Records  JARDIANCE  10 MG TABS tablet [id number] No TAKE 1 TABLET BY MOUTH DAILY  BEFORE BREAKFAST  Atway, Rayann N, DO 12/09/2023 Active Child, Pharmacy Records  losartan  (COZAAR ) 100 MG tablet [id number] No TAKE 1 TABLET BY MOUTH DAILY Atway, Rayann N, DO 12/09/2023 Active Child, Pharmacy Records  methocarbamol  (ROBAXIN ) 500 MG tablet [id number]  Take 1 tablet (500 mg total) by mouth every 8 (eight) hours as needed for muscle spasms. Waymond Cart, MD  Active   Multiple Vitamins-Minerals (MENS 50+ MULTIVITAMIN) TABS [id number] No Take 1 tablet by mouth daily. [provider] 12/09/2023 Active Child, Pharmacy Records  nystatin  (MYCOSTATIN ) 100000 UNIT/ML suspension [id number]  Take 5 mLs (500,000 Units total) by mouth 4 (four) times daily. Waymond Cart, MD  Active   omeprazole  (PRILOSEC) 40 MG capsule [id number] No TAKE 1 CAPSULE BY MOUTH DAILY Atway, Rayann N, DO 12/09/2023 Active Child, Pharmacy Records  rosuvastatin  (CRESTOR ) 40 MG tablet [id number] No TAKE 1 TABLET BY MOUTH DAILY  Patient taking differently: Take 40 mg by mouth at bedtime.   Atway, Rayann N, DO 12/09/2023 Active Child, Pharmacy Records  SPIRIVA  RESPIMAT 2.5 MCG/ACT AERS [id number] No USE 2 INHALATIONS BY MOUTH ONCE  DAILY Atway, Rayann N, DO Past Month Active Child, Pharmacy Records           Med Note (LEE, NICOLE   Thu Dec 13, 2023  8:37 AM) Patient does not take consistently.   triamcinolone  ointment (KENALOG ) 0.1 % [id number] No APPLY TOPICALLY TO AFFECTED  AREA(S) TWICE DAILY AS NEEDED  FOR ITCHING Atway, Rayann N, DO Past Month Active Child, Pharmacy Records           Med Note (LEE, NICOLE   Thu Dec 13, 2023  8:39 AM) No dispense information to support medication.            Home Care and Equipment/Supplies: Were Home Health Services Ordered?: NA Any new equipment or medical supplies ordered?: NA  Functional Questionnaire: Do you need assistance with bathing/showering or dressing?: No Do you need assistance with meal preparation?: No Do you need assistance with eating?: No Do you have difficulty maintaining  continence: No Do you need assistance with getting out of bed/getting out of a chair/moving?: No Do you have difficulty managing or taking your medications?: No  Follow up appointments reviewed: PCP Follow-up appointment confirmed?: Yes Date of PCP follow-up appointment?: 12/21/23 Follow-up Provider: Lonni Register Specialist [facility] Follow-up appointment confirmed?: NA Do you need transportation to your follow-up appointment?: No Do you understand care options if your condition(s) worsen?: Yes-patient verbalized understanding  SDOH Interventions Today    Flowsheet Row Most Recent Value  SDOH Interventions   Food Insecurity Interventions Intervention Not Indicated  Housing Interventions Intervention Not Indicated  Transportation Interventions Intervention Not Indicated  Utilities Interventions Intervention Not Indicated    Goals Addressed             This Visit's Progress    VBCI Transitions of Care (TOC) Care Plan       Problems:  Recent Hospitalization for treatment of Nuchial Rigidity in Neck; Soft tissue Infection Hospital or ED Adm Risk is 94%  Goal:  Over the next 30 days, the patient will not experience hospital readmission  Interventions:   Pain Interventions: Pain assessment performed Medications reviewed Discussed importance of adherence to all scheduled medical appointments Counseled on the importance of reporting any/all new or  changed pain symptoms or management strategies to pain management provider Advised patient to report to care team affect of pain on daily activities Discussed use of relaxation techniques and/or diversional activities to assist with pain reduction (distraction, imagery, relaxation, massage, acupressure, TENS, heat, and cold application Reviewed with patient prescribed pharmacological and nonpharmacological pain relief strategies  Patient Self Care Activities:  Attend all scheduled provider appointments Call pharmacy for  medication refills 3-7 days in advance of running out of medications Call provider office for new concerns or questions  Notify RN Care Manager of [facility] call rescheduling needs Participate in Transition of Care Program/Attend [facility] scheduled calls Perform all self care activities independently  Take medications as prescribed    Plan:  Telephone follow up appointment with care management team member scheduled for:  Wednesday September 10th at [facility], BSN, RN [place]  VBCI - [employer] [facility] RN Care Manager [phone]

## 2023-12-17 NOTE — Patient Instructions (Signed)
 Visit Information  Thank you for taking time to visit with me today. Please don't hesitate to contact me if I can be of assistance to you before our next scheduled telephone appointment.  Our next appointment is by telephone on Wednesday September 10th at 10:00am  Following is a copy of your care plan:   Goals Addressed             This Visit's Progress    VBCI Transitions of Care (TOC) Care Plan       Problems:  Recent Hospitalization for treatment of Nuchial Rigidity in Neck; Soft tissue Infection Hospital or ED Adm Risk is 94%  Goal:  Over the next 30 days, the patient will not experience hospital readmission  Interventions:   Pain Interventions: Pain assessment performed Medications reviewed Discussed importance of adherence to all scheduled medical appointments Counseled on the importance of reporting any/all new or changed pain symptoms or management strategies to pain management provider Advised patient to report to care team affect of pain on daily activities Discussed use of relaxation techniques and/or diversional activities to assist with pain reduction (distraction, imagery, relaxation, massage, acupressure, TENS, heat, and cold application Reviewed with patient prescribed pharmacological and nonpharmacological pain relief strategies  Patient Self Care Activities:  Attend all scheduled provider appointments Call pharmacy for medication refills 3-7 days in advance of running out of medications Call provider office for new concerns or questions  Notify RN Care Manager of TOC call rescheduling needs Participate in Transition of Care Program/Attend TOC scheduled calls Perform all self care activities independently  Take medications as prescribed    Plan:  Telephone follow up appointment with care management team member scheduled for:  Wednesday September 10th at 10am        Patient verbalizes understanding of instructions and care plan provided today and  agrees to view in MyChart. Active MyChart status and patient understanding of how to access instructions and care plan via MyChart confirmed with patient.     The patient has been provided with contact information for the care management team and has been advised to call with any health related questions or concerns.   Please call the care guide team at [phone] if you need to cancel or reschedule your appointment.   Please call the Suicide and Crisis Lifeline: 988 call the [place]  National Suicide Prevention Lifeline: 931-750-1363 or TTY: 786-875-8163 TTY [phone]) to talk to a trained counselor if you are experiencing a Mental Health or Behavioral Health Crisis or need someone to talk to.  Medford Balboa, BSN, RN [place]  VBCI - [employer] [facility] RN Care Manager [phone]

## 2023-12-18 LAB — CULTURE, BLOOD (ROUTINE X 2)
Culture: NO GROWTH
Culture: NO GROWTH
Special Requests: ADEQUATE
Special Requests: ADEQUATE

## 2023-12-18 NOTE — Telephone Encounter (Signed)
 We have open appts tomorrow at this time - I called pt - no answer; left message on pt's vm to call the office if he can come tomorrow.

## 2023-12-19 ENCOUNTER — Other Ambulatory Visit: Payer: Self-pay

## 2023-12-19 ENCOUNTER — Other Ambulatory Visit: Payer: Self-pay | Admitting: *Deleted

## 2023-12-19 NOTE — Transitions of Care (Post Inpatient/ED Visit) (Signed)
 Transition of Care Week 1 additional follow up call  Visit Note  12/19/2023  Name: Seth Santana MRN: [id number]          DOB: [birth year]  Situation: Patient enrolled in [facility] 30-day program. Visit completed with the patient and his daughter by telephone.   Background:   Past Medical History:  Diagnosis Date   Blood in stool last few years   BPH (benign prostatic hyperplasia)    Closed fracture of tripod (HCC) 02/18/2013   Hard of hearing    Headache(784.0)    Hypertension     Assessment: Patient Reported Symptoms: Cognitive Cognitive Status: Alert and oriented to person, place, and time, Able to follow simple commands      Neurological Neurological Review of Symptoms: Headaches Neurological Management Strategies: Coping strategies, Medication therapy Neurological Comment: Headaches from stiff neck  HEENT HEENT Symptoms Reported: Mouth dryness HEENT Management Strategies: Medication therapy, Routine screening HEENT Comment: The patient continues with his stiff neck and some soreness in his throat    Cardiovascular Cardiovascular Symptoms Reported: No symptoms reported Cardiovascular Management Strategies: Medication therapy  Respiratory Respiratory Symptoms Reported: No symptoms reported    Endocrine Endocrine Symptoms Reported: No symptoms reported Is patient diabetic?: Yes Is patient checking blood sugars at home?: No    Gastrointestinal Gastrointestinal Symptoms Reported: No symptoms reported      Genitourinary Genitourinary Symptoms Reported: No symptoms reported    Integumentary Integumentary Symptoms Reported: No symptoms reported    Musculoskeletal Musculoskelatal Symptoms Reviewed: Muscle pain Musculoskeletal Management Strategies: Medication therapy, Adequate rest, Activity, Routine screening Musculoskeletal Comment: The patient reports some improvement in his neck movement      Psychosocial Psychosocial Symptoms Reported: No symptoms reported          There were no vitals filed for this visit.  Medications Reviewed Today     Reviewed by Moises Reusing, RN (Case Manager) on 12/19/23 at 1034  Med List Status: <None>   Medication Order Taking? Sig Documenting Provider Last Dose Status Informant  acetaminophen  (TYLENOL ) 500 MG tablet [id number]  Take 2 tablets (1,000 mg total) by mouth every 6 (six) hours as needed for mild pain (pain score 1-3) or moderate pain (pain score 4-6). Waymond Cart, MD  Active   aspirin  EC 81 MG tablet [id number] No Take 1 tablet (81 mg total) by mouth daily. Swallow whole. Darron Deatrice LABOR, MD 12/09/2023 Active Child, Pharmacy Records  augmented betamethasone dipropionate (DIPROLENE-AF) 0.05 % cream [id number] No Apply 1 Application topically as needed (dry skin). [provider] Past Month Active Child, Pharmacy Records           Med Note (LEE, NICOLE   Thu Dec 13, 2023  8:38 AM) No dispense information to support medication.  carvedilol  (COREG ) 12.5 MG tablet [id number] No Take 1 tablet (12.5 mg total) by mouth 2 (two) times daily with a meal. Renne Homans, MD 12/09/2023 Active Child, Pharmacy Records  cephALEXin  (KEFLEX ) 500 MG capsule [id number]  Take 1 capsule (500 mg total) by mouth 4 (four) times daily for 14 days. Waymond Cart, MD  Active   Cyanocobalamin  (B-12 PO) [id number] No Take 1 tablet by mouth daily as needed (energy). [provider] 12/09/2023 Active Child, Pharmacy Records  ezetimibe  (ZETIA ) 10 MG tablet [id number] No TAKE 1 TABLET BY MOUTH DAILY Chandrasekhar, Mahesh A, MD 12/09/2023 Active Child, Pharmacy Records  ferrous sulfate  325 (65 FE) MG EC tablet [id number] No Take 1 tablet (325 mg total) by mouth 2 (two) times daily. Jimmy,  Rayann N, DO 12/09/2023 Active Child, Pharmacy Records  FIBER PO [id number] No Take 1 tablet by mouth daily as needed (constipation). [provider] Past Month Active Child, Pharmacy Records  finasteride  (PROSCAR ) 5 MG tablet [id number] No TAKE 1  TABLET BY MOUTH DAILY Atway, Rayann N, DO 12/09/2023 Active Child, Pharmacy Records  furosemide  (LASIX ) 80 MG tablet [id number] No TAKE 1 TABLET BY MOUTH DAILY IN  THE MORNING Santo Stanly LABOR, MD 12/09/2023 Active Child, Pharmacy Records  gabapentin  (NEURONTIN ) 300 MG capsule [id number] No TAKE 1 CAPSULE BY MOUTH TWICE  DAILY Atway, Rayann N, DO 12/09/2023 Active Child, Pharmacy Records  hydrALAZINE  (APRESOLINE ) 50 MG tablet [id number] No TAKE 1 TABLET BY MOUTH IN THE  MORNING AND AT BEDTIME Atway, Rayann N, DO 12/09/2023 Active Child, Pharmacy Records  hydrOXYzine  (ATARAX ) 10 MG tablet [id number] No TAKE 1 TABLET BY MOUTH EVERY 8  HOURS AS NEEDED FOR ITCHING Atway, Rayann LOISE, DO Past Week Active Child, Pharmacy Records  JARDIANCE  10 MG TABS tablet [id number] No TAKE 1 TABLET BY MOUTH DAILY  BEFORE BREAKFAST Atway, Rayann N, DO 12/09/2023 Active Child, Pharmacy Records  losartan  (COZAAR ) 100 MG tablet [id number] No TAKE 1 TABLET BY MOUTH DAILY Atway, Rayann N, DO 12/09/2023 Active Child, Pharmacy Records  methocarbamol  (ROBAXIN ) 500 MG tablet [id number]  Take 1 tablet (500 mg total) by mouth every 8 (eight) hours as needed for muscle spasms. Waymond Cart, MD  Active   Multiple Vitamins-Minerals (MENS 50+ MULTIVITAMIN) TABS [id number] No Take 1 tablet by mouth daily. [provider] 12/09/2023 Active Child, Pharmacy Records  nystatin  (MYCOSTATIN ) 100000 UNIT/ML suspension [id number]  Take 5 mLs (500,000 Units total) by mouth 4 (four) times daily. Waymond Cart, MD  Active   omeprazole  [facility]) 40 MG capsule [id number] No TAKE 1 CAPSULE BY MOUTH DAILY Atway, Rayann N, DO 12/09/2023 Active Child, Pharmacy Records  rosuvastatin  (CRESTOR ) 40 MG tablet [id number] No TAKE 1 TABLET BY MOUTH DAILY  Patient taking differently: Take 40 mg by mouth at bedtime.   Atway, Rayann N, DO 12/09/2023 Active Child, Pharmacy Records  SPIRIVA  RESPIMAT 2.5 MCG/ACT AERS [id number] No USE 2 INHALATIONS BY MOUTH ONCE  DAILY Atway,  Rayann N, DO Past Month Active Child, Pharmacy Records           Med Note (LEE, NICOLE   Thu Dec 13, 2023  8:37 AM) Patient does not take consistently.   triamcinolone  ointment (KENALOG ) 0.1 % [id number] No APPLY TOPICALLY TO AFFECTED  AREA(S) TWICE DAILY AS NEEDED  FOR ITCHING Atway, Rayann N, DO Past Month Active Child, Pharmacy Records           Med Note (LEE, NICOLE   Thu Dec 13, 2023  8:39 AM) No dispense information to support medication.            Recommendation:   PCP Follow-up Continue Current Plan of Care  Follow Up Plan:   Telephone follow-up in 1 week  Medford Balboa, BSN, RN [place]  VBCI - [facility] RN Care Manager [phone]

## 2023-12-19 NOTE — Patient Instructions (Signed)
 Visit Information  Thank you for taking time to visit with me today. Please don't hesitate to contact me if I can be of assistance to you before our next scheduled telephone appointment.  Our next appointment is by telephone on Wednesday September 17th at 10:00am  Following is a copy of your care plan:   Goals Addressed             This Visit's Progress    VBCI Transitions of Care (TOC) Care Plan       Problems: ( reviewed 12/19/23) Recent Hospitalization for treatment of Nuchial Rigidity in Neck; Soft tissue Infection Hospital or ED Adm Risk is 94%  Goal: ( reviewed 12/19/23) Over the next 30 days, the patient will not experience hospital readmission  Interventions: ( reviewed 12/19/23)  Pain Interventions: Pain assessment performed Medications reviewed Discussed importance of adherence to all scheduled medical appointments Counseled on the importance of reporting any/all new or changed pain symptoms or management strategies to pain management provider Advised patient to report to care team affect of pain on daily activities Discussed use of relaxation techniques and/or diversional activities to assist with pain reduction (distraction, imagery, relaxation, massage, acupressure, TENS, heat, and cold application Reviewed with patient prescribed pharmacological and nonpharmacological pain relief strategies  Patient Self Care Activities: ( reviewed 12/19/23) Attend all scheduled provider appointments Call pharmacy for medication refills 3-7 days in advance of running out of medications Call provider office for new concerns or questions  Notify RN Care Manager of [facility] call rescheduling needs Participate in Transition of Care Program/Attend [facility] scheduled calls Perform all self care activities independently  Take medications as prescribed    Plan:  Telephone follow up appointment with care management team member scheduled for:  Wednesday September 17th at 10am        Patient  verbalizes understanding of instructions and care plan provided today and agrees to view in MyChart. Active MyChart status and patient understanding of how to access instructions and care plan via MyChart confirmed with patient.     The patient has been provided with contact information for the care management team and has been advised to call with any health related questions or concerns.   Please call the care guide team at [phone] if you need to cancel or reschedule your appointment.   Please call the Suicide and Crisis Lifeline: 988 call the [place]  National Suicide Prevention Lifeline: (314)799-8555 or TTY: (240)830-3629 TTY 256 458 6550) to talk to a trained counselor if you are experiencing a Mental Health or Behavioral Health Crisis or need someone to talk to.  Medford Balboa, BSN, RN [place]  VBCI - [facility] RN Care Manager [phone]

## 2023-12-20 MED ORDER — CARVEDILOL 12.5 MG PO TABS: 12.5000 mg | ORAL_TABLET | Freq: Two times a day (BID) | ORAL | 2 refills | Status: AC

## 2023-12-20 NOTE — Telephone Encounter (Signed)
 Pt has an appt tomorrow 12/21/23.

## 2023-12-21 ENCOUNTER — Ambulatory Visit (INDEPENDENT_AMBULATORY_CARE_PROVIDER_SITE_OTHER): Admitting: Student

## 2023-12-21 ENCOUNTER — Other Ambulatory Visit: Payer: Self-pay

## 2023-12-21 ENCOUNTER — Encounter: Payer: Self-pay | Admitting: Student

## 2023-12-21 VITALS — BP 131/65 | HR 72 | Temp 98.2°F | Ht 68.0 in | Wt 215.6 lb

## 2023-12-21 DIAGNOSIS — E1122 Type 2 diabetes mellitus with diabetic chronic kidney disease: Secondary | ICD-10-CM

## 2023-12-21 DIAGNOSIS — Z7984 Long term (current) use of oral hypoglycemic drugs: Secondary | ICD-10-CM

## 2023-12-21 DIAGNOSIS — E782 Mixed hyperlipidemia: Secondary | ICD-10-CM

## 2023-12-21 DIAGNOSIS — B370 Candidal stomatitis: Secondary | ICD-10-CM

## 2023-12-21 DIAGNOSIS — I5032 Chronic diastolic (congestive) heart failure: Secondary | ICD-10-CM

## 2023-12-21 DIAGNOSIS — R291 Meningismus: Secondary | ICD-10-CM

## 2023-12-21 DIAGNOSIS — M5030 Other cervical disc degeneration, unspecified cervical region: Secondary | ICD-10-CM

## 2023-12-21 DIAGNOSIS — N1832 Chronic kidney disease, stage 3b: Secondary | ICD-10-CM

## 2023-12-21 DIAGNOSIS — N401 Enlarged prostate with lower urinary tract symptoms: Secondary | ICD-10-CM

## 2023-12-21 DIAGNOSIS — E785 Hyperlipidemia, unspecified: Secondary | ICD-10-CM

## 2023-12-21 MED ORDER — EZETIMIBE 10 MG PO TABS: 10.0000 mg | ORAL_TABLET | Freq: Every day | ORAL | 3 refills | Status: AC

## 2023-12-21 MED ORDER — HYDROXYZINE HCL 10 MG PO TABS: 10.0000 mg | ORAL_TABLET | Freq: Three times a day (TID) | ORAL | 0 refills | Status: AC | PRN

## 2023-12-21 NOTE — Patient Instructions (Addendum)
 Thank you, Mr.River S Angello for allowing us  to provide your care today. Today we discussed about your neck stiffness.   Referrals: - Orthopedic to evaluate your neck  - Physical Therapy   New medications: - None  I have ordered the following labs for you:  Lab Orders  No laboratory test(s) ordered today     I will call if any are abnormal. All of your labs can be accessed through My Chart.   My Chart Access: [email or web address]    We look forward to seeing you next time. Please call our clinic at [phone] if you have any questions or concerns. The best time to call is Monday-Friday from 9am-4pm, but there is someone available 24/7. If after hours or the weekend, call the main hospital number and ask for the Internal Medicine Resident On-Call. If you need medication refills, please notify your pharmacy one week in advance and they will send us  a request.   Thank you for letting us  take part in your care. Wishing you the best!  Heddy Barren, DO 12/21/2023, 10:04 AM [facility]

## 2023-12-21 NOTE — Progress Notes (Signed)
 Established Patient Office Visit  Subjective   Patient ID: Seth Santana, male    DOB: 1950/06/02  Age: 73 y.o. MRN: [id number]  Chief Complaint  Patient presents with   Transitions Of Care    HFU     Seth Santana is a 73 y.o. male with past medical history CKD 3B, hypertension, type 2 diabetes, HLD, BPH, GERD, tobacco use, peripheral neuropathy, COPD presenting today for hospital follow-up.  Patient was recently admitted on 12/12/2023 for nuchal rigidity and prevertebral soft tissue infection, discharged on 12/14/2023.  Follow up Hospitalization  Patient was admitted to [facility] on 12/12/2023 and discharged on 12/14/2023. He was treated for Nuchal Rigidity  and prevertebral soft tissue infection . Treatment for this included Robaxin  500 mg q8h prn, Keflex  500 mg QID for 14 days . Telephone follow up was done on 12/17/2023 He reports excellent compliance with treatment. He reports this condition is improved.  ----------------------------------------------------------------------------------------- -   Review of Systems:  As per assessment and Plan   Objective:     Vitals:   12/21/23 0931  BP: 131/65  Pulse: 72  Temp: 98.2 F (36.8 C)  TempSrc: Oral  SpO2: 99%  Weight: 215 lb 9.6 oz (97.8 kg)  Height: 5' 8 (1.727 m)    Physical Exam General: Sitting in chair, no acute distress Neck: +hypertonic muscles of the cervical spine, ROM limited with extension, Side bending and rotation   Cardiovascular: Regular rate Pulmonary: Breathing comfortably Abdomen: Soft, nontender, nondistended MSK: No lower extremity edema bilaterally  The 10-year ASCVD risk score (Arnett DK, et al., 2019) is: 51.7%    Assessment & Plan:   Patient discussed with Dr. Francesco  Problem List Items Addressed This Visit       Cardiovascular and Mediastinum   Diastolic congestive heart failure (HCC)   Continue taking Coreg  12.5 mg daily, losartan  100 mg daily, hydralazine  50 mg daily, furosemide   80 mg daily      Relevant Medications   ezetimibe  (ZETIA ) 10 MG tablet     Digestive   Thrush, oral   Improved.         Endocrine   Diabetes mellitus (HCC)   Lab Results  Component Value Date   HGBA1C 6.9 (A) 11/23/2023   A1c at goal.  Continue Jardiance  10 mg daily.        Genitourinary   BPH (benign prostatic hyperplasia)   Continue taking finasteride  5 mg daily.        Other   Hyperlipidemia   Continue taking ezetimibe  10 mg daily and Crestor  40 mg daily      Relevant Medications   ezetimibe  (ZETIA ) 10 MG tablet   Nuchal rigidity   Patient was recently admitted for nuchal rigidity, was found of have evidence of prevertebral soft tissue infection on MRI. LP negative, CSF cultures WNL, blood cultures NG. Denies any worsening neck pain, fevers or confusion. Reports compliance with Keflex . ROM of the neck is still very limited and hypertonicity of the muscles are present along the cervical spine. Patients MRI that note Diffuse degenerative disc disease throughout the cervical spine, osteophytes present at C2-3 and C4-5, moderate central canal stenosis at C4-5 secondary to posterior disc bulging and endplate ridging.  I wondering if the complexity of the degenerative disease is playing a role in the nuchal rigidity of this patient, would recommend assessment by orthopedics. - Orthopedic referrals placed - Patient is advised to complete Keflex  course - Referral to physical therapy was placed -Return  to clinic if symptoms worsen      Relevant Orders   AMB referral to orthopedics   Ambulatory referral to Physical Therapy   Other Visit Diagnoses       Degenerative disc disease, cervical    -  Primary   Relevant Orders   AMB referral to orthopedics       Return if symptoms worsen or fail to improve.    Seth Edwards, DO

## 2023-12-26 ENCOUNTER — Other Ambulatory Visit: Payer: Self-pay

## 2023-12-26 DIAGNOSIS — B370 Candidal stomatitis: Secondary | ICD-10-CM | POA: Insufficient documentation

## 2023-12-26 MED ORDER — METHOCARBAMOL 500 MG PO TABS: 500.0000 mg | ORAL_TABLET | Freq: Three times a day (TID) | ORAL | 0 refills | Status: AC | PRN

## 2023-12-26 NOTE — Transitions of Care (Post Inpatient/ED Visit) (Signed)
 Transition of Care week 3  Visit Note  12/26/2023  Name: Seth Santana MRN: [id number]          DOB: 1950/08/14  Situation: Patient enrolled in [facility] 30-day program. Visit completed with Jamie Hartfield, DPR by telephone.   Background:   Past Medical History:  Diagnosis Date   Blood in stool last few years   BPH (benign prostatic hyperplasia)    Closed fracture of tripod (HCC) 02/18/2013   Hard of hearing    Headache(784.0)    Hypertension     Assessment: Patient Reported Symptoms: Cognitive Cognitive Status: Alert and oriented to person, place, and time, Able to follow simple commands      Neurological Neurological Review of Symptoms: Headaches Neurological Management Strategies: Adequate rest, Medication therapy, Coping strategies Neurological Comment: Occasional. Improving  HEENT HEENT Symptoms Reported: Mouth dryness, Other: (Thrush on tongue from not rinsing his mouth after Spiriva ) HEENT Management Strategies: Medication therapy, Routine screening HEENT Comment: The patient doesn't take his Spiriva  consistently due to mouth dryness but e doesn't feel the need either    Cardiovascular Cardiovascular Symptoms Reported: No symptoms reported Cardiovascular Management Strategies: Medication therapy  Respiratory Respiratory Symptoms Reported: No symptoms reported Additional Respiratory Details: Smokes occasionally; Smokes tiny cigar like thing. Cost him $2.00 a piece. Declines nicotine  patch Respiratory Management Strategies: Routine screening, Medication therapy  Endocrine Endocrine Symptoms Reported: No symptoms reported Is patient diabetic?: Yes Is patient checking blood sugars at home?: No    Gastrointestinal Gastrointestinal Symptoms Reported: No symptoms reported      Genitourinary Genitourinary Symptoms Reported: No symptoms reported    Integumentary Integumentary Symptoms Reported: No symptoms reported    Musculoskeletal Musculoskelatal Symptoms Reviewed:  Weakness, Other Other Musculoskeletal Symptoms: Stiffness and limited mobility in his neck Musculoskeletal Management Strategies: Medication therapy, Adequate rest, Routine screening Musculoskeletal Comment: There is continued improvement in his neck stiffness. He has been taking Robaxin  Consistenly. He has been referred to Orthopedist and he is taking Robaxin  regularly      Psychosocial Psychosocial Symptoms Reported: No symptoms reported         There were no vitals filed for this visit.  Medications Reviewed Today     Reviewed by Moises Reusing, RN (Case Manager) on 12/26/23 at 1022  Med List Status: <None>   Medication Order Taking? Sig Documenting Provider Last Dose Status Informant  acetaminophen  (TYLENOL ) 500 MG tablet [id number]  Take 2 tablets (1,000 mg total) by mouth every 6 (six) hours as needed for mild pain (pain score 1-3) or moderate pain (pain score 4-6). Waymond Cart, MD  Active   aspirin  EC 81 MG tablet [id number] No Take 1 tablet (81 mg total) by mouth daily. Swallow whole. Darron Deatrice LABOR, MD 12/09/2023 Active Child, Pharmacy Records  augmented betamethasone dipropionate (DIPROLENE-AF) 0.05 % cream [id number] No Apply 1 Application topically as needed (dry skin). [provider] Past Month Active Child, Pharmacy Records           Med Note (LEE, NICOLE   Thu Dec 13, 2023  8:38 AM) No dispense information to support medication.  carvedilol  (COREG ) 12.5 MG tablet [id number]  Take 1 tablet (12.5 mg total) by mouth 2 (two) times daily with a meal. Renne Homans, MD  Active   cephALEXin  (KEFLEX ) 500 MG capsule [id number]  Take 1 capsule (500 mg total) by mouth 4 (four) times daily for 14 days. Waymond Cart, MD  Active   Cyanocobalamin  (B-12 PO) [id number] No Take 1 tablet by mouth daily as  needed (energy). [provider] 12/09/2023 Active Child, Pharmacy Records  ezetimibe  (ZETIA ) 10 MG tablet [id number]  Take 1 tablet (10 mg total) by mouth daily. Heddy Barren, DO  Active   ferrous sulfate  325 (65 FE) MG EC tablet [id number] No Take 1 tablet (325 mg total) by mouth 2 (two) times daily. Atway, Rayann N, DO 12/09/2023 Active Child, Pharmacy Records  FIBER PO [id number] No Take 1 tablet by mouth daily as needed (constipation). [provider] Past Month Active Child, Pharmacy Records  finasteride  (PROSCAR ) 5 MG tablet [id number] No TAKE 1 TABLET BY MOUTH DAILY Atway, Rayann N, DO 12/09/2023 Active Child, Pharmacy Records  furosemide  (LASIX ) 80 MG tablet [id number] No TAKE 1 TABLET BY MOUTH DAILY IN  THE MORNING Santo Stanly LABOR, MD 12/09/2023 Active Child, Pharmacy Records  gabapentin  (NEURONTIN ) 300 MG capsule [id number] No TAKE 1 CAPSULE BY MOUTH TWICE  DAILY Atway, Rayann N, DO 12/09/2023 Active Child, Pharmacy Records  hydrALAZINE  (APRESOLINE ) 50 MG tablet [id number] No TAKE 1 TABLET BY MOUTH IN THE  MORNING AND AT BEDTIME Atway, Rayann N, DO 12/09/2023 Active Child, Pharmacy Records  hydrOXYzine  (ATARAX ) 10 MG tablet [id number]  Take 1 tablet (10 mg total) by mouth 3 (three) times daily as needed for itching. Heddy Barren, DO  Active   JARDIANCE  10 MG TABS tablet [id number] No TAKE 1 TABLET BY MOUTH DAILY  BEFORE BREAKFAST Atway, Rayann N, DO 12/09/2023 Active Child, Pharmacy Records  losartan  (COZAAR ) 100 MG tablet [id number] No TAKE 1 TABLET BY MOUTH DAILY Atway, Rayann N, DO 12/09/2023 Active Child, Pharmacy Records  methocarbamol  (ROBAXIN ) 500 MG tablet [id number]  Take 1 tablet (500 mg total) by mouth every 8 (eight) hours as needed for muscle spasms. Waymond Cart, MD  Active   Multiple Vitamins-Minerals (MENS 50+ MULTIVITAMIN) TABS [id number] No Take 1 tablet by mouth daily. [provider] 12/09/2023 Active Child, Pharmacy Records  nystatin  (MYCOSTATIN ) 100000 UNIT/ML suspension [id number]  Take 5 mLs (500,000 Units total) by mouth 4 (four) times daily. Waymond Cart, MD  Active   omeprazole  [facility]) 40 MG capsule [id number] No TAKE 1  CAPSULE BY MOUTH DAILY Atway, Rayann N, DO 12/09/2023 Active Child, Pharmacy Records  rosuvastatin  (CRESTOR ) 40 MG tablet [id number] No TAKE 1 TABLET BY MOUTH DAILY  Patient taking differently: Take 40 mg by mouth at bedtime.   Atway, Rayann N, DO 12/09/2023 Active Child, Pharmacy Records  SPIRIVA  RESPIMAT 2.5 MCG/ACT AERS [id number] No USE 2 INHALATIONS BY MOUTH ONCE  DAILY Atway, Rayann N, DO Past Month Active Child, Pharmacy Records           Med Note (LEE, NICOLE   Thu Dec 13, 2023  8:37 AM) Patient does not take consistently.   triamcinolone  ointment (KENALOG ) 0.1 % [id number] No APPLY TOPICALLY TO AFFECTED  AREA(S) TWICE DAILY AS NEEDED  FOR ITCHING Atway, Rayann N, DO Past Month Active Child, Pharmacy Records           Med Note (LEE, NICOLE   Thu Dec 13, 2023  8:39 AM) No dispense information to support medication.            Recommendation:   Continue Current Plan of Care  Follow Up Plan:   Telephone follow-up in 1 week  Medford Balboa, BSN, RN [place]  VBCI - [facility] RN Care Manager [phone]

## 2023-12-26 NOTE — Assessment & Plan Note (Signed)
 Continue taking ezetimibe  10 mg daily and Crestor  40 mg daily

## 2023-12-26 NOTE — Assessment & Plan Note (Signed)
 Improved.

## 2023-12-26 NOTE — Assessment & Plan Note (Signed)
 Lab Results  Component Value Date   HGBA1C 6.9 (A) 11/23/2023   A1c at goal.  Continue Jardiance  10 mg daily.

## 2023-12-26 NOTE — Telephone Encounter (Signed)
-----   Message from [facility] sent at 12/26/2023 10:50 AM EDT ----- [facility] Outreach to this patient today. His neck stiffness is improving now that he is taking the Robaxin  every 8 hours. He is running low and would like a refill. He was at the clinic 12/21/23 and he was not told to resume his FESO4 325mg  twice daily. His Hgb has been dropping over the last two weeks. Last lab was 9.3 which is a drop from 11.2. Please advise if he is to resume FeSo4.  Medford Balboa, BSN, RN [place]  VBCI - [employer] [facility] RN Care Manager [phone]

## 2023-12-26 NOTE — Progress Notes (Signed)
 Internal Medicine Clinic Attending  Case discussed with the resident at the time of the visit.  We reviewed the resident's history and exam and pertinent patient test results.  I agree with the assessment, diagnosis, and plan of care documented in the resident's note.

## 2023-12-26 NOTE — Assessment & Plan Note (Signed)
 Continue taking Coreg  12.5 mg daily, losartan  100 mg daily, hydralazine  50 mg daily, furosemide  80 mg daily

## 2023-12-26 NOTE — Assessment & Plan Note (Signed)
 Patient was recently admitted for nuchal rigidity, was found of have evidence of prevertebral soft tissue infection on MRI. LP negative, CSF cultures WNL, blood cultures NG. Denies any worsening neck pain, fevers or confusion. Reports compliance with Keflex . ROM of the neck is still very limited and hypertonicity of the muscles are present along the cervical spine. Patients MRI that note Diffuse degenerative disc disease throughout the cervical spine, osteophytes present at C2-3 and C4-5, moderate central canal stenosis at C4-5 secondary to posterior disc bulging and endplate ridging.  I wondering if the complexity of the degenerative disease is playing a role in the nuchal rigidity of this patient, would recommend assessment by orthopedics. - Orthopedic referrals placed - Patient is advised to complete Keflex  course - Referral to physical therapy was placed -Return to clinic if symptoms worsen

## 2023-12-26 NOTE — Assessment & Plan Note (Signed)
 Continue taking finasteride  5 mg daily.

## 2023-12-26 NOTE — Patient Instructions (Signed)
 Visit Information  Thank you for taking time to visit with me today. Please don't hesitate to contact me if I can be of assistance to you before our next scheduled telephone appointment.  Our next appointment is by telephone on Wednesday September 24th at 10:00am  Following is a copy of your care plan:   Goals Addressed             This Visit's Progress    VBCI Transitions of Care (TOC) Care Plan       Problems: ( reviewed 12/26/23) Recent Hospitalization for treatment of Nuchial Rigidity in Neck; Soft tissue Infection Hospital or ED Adm Risk is 94%  Goal: ( reviewed 12/26/23) Over the next 30 days, the patient will not experience hospital readmission  Interventions: ( reviewed 12/26/23)  Pain Interventions: Pain assessment performed Medications reviewed Discussed importance of adherence to all scheduled medical appointments Counseled on the importance of reporting any/all new or changed pain symptoms or management strategies to pain management provider Advised patient to report to care team affect of pain on daily activities Discussed use of relaxation techniques and/or diversional activities to assist with pain reduction (distraction, imagery, relaxation, massage, acupressure, TENS, heat, and cold application Reviewed with patient prescribed pharmacological and nonpharmacological pain relief strategies  Patient Self Care Activities: ( reviewed 12/26/23) Attend all scheduled provider appointments Call pharmacy for medication refills 3-7 days in advance of running out of medications Call provider office for new concerns or questions  Notify RN Care Manager of [facility] call rescheduling needs Participate in Transition of Care Program/Attend [facility] scheduled calls Perform all self care activities independently  Take medications as prescribed    Plan:  Telephone follow up appointment with care management team member scheduled for:  Wednesday September 24th at 10am        Patient  verbalizes understanding of instructions and care plan provided today and agrees to view in MyChart. Active MyChart status and patient understanding of how to access instructions and care plan via MyChart confirmed with patient.     The patient has been provided with contact information for the care management team and has been advised to call with any health related questions or concerns.   Please call the care guide team at [phone] if you need to cancel or reschedule your appointment.   Please call the Suicide and Crisis Lifeline: 988 call the [place]  National Suicide Prevention Lifeline: (208) 677-2438 or TTY: 862-096-0482 TTY 705 556 9141) to talk to a trained counselor if you are experiencing a Mental Health or Behavioral Health Crisis or need someone to talk to.  Medford Balboa, BSN, RN [place]  VBCI - [employer] [facility] RN Care Manager [phone]

## 2024-01-02 ENCOUNTER — Other Ambulatory Visit: Payer: Self-pay

## 2024-01-04 ENCOUNTER — Other Ambulatory Visit: Payer: Self-pay

## 2024-01-04 DIAGNOSIS — I10 Essential (primary) hypertension: Secondary | ICD-10-CM

## 2024-01-04 MED ORDER — LOSARTAN POTASSIUM 100 MG PO TABS: 100.0000 mg | ORAL_TABLET | Freq: Every day | ORAL | 2 refills | Status: AC

## 2024-01-07 ENCOUNTER — Telehealth: Payer: Self-pay | Admitting: Dietician

## 2024-01-09 NOTE — Telephone Encounter (Signed)
 Call to patient. Left voicemail for return call

## 2024-01-17 ENCOUNTER — Other Ambulatory Visit: Payer: Self-pay

## 2024-01-17 MED ORDER — OMEPRAZOLE 40 MG PO CPDR: 40.0000 mg | DELAYED_RELEASE_CAPSULE | Freq: Every day | ORAL | 2 refills | Status: AC

## 2024-01-17 MED ORDER — FINASTERIDE 5 MG PO TABS: 5.0000 mg | ORAL_TABLET | Freq: Every day | ORAL | 2 refills | Status: AC

## 2024-01-28 ENCOUNTER — Telehealth: Payer: Self-pay

## 2024-01-28 NOTE — Telephone Encounter (Signed)
 RUDOLF BLIZARD (Key: [id number]) PA Case ID #: [id number] Rx #: [id number] Need Help? Call us  at [phone] Outcome Additional Information Required This medication or product is on your plan's list of covered drugs. Prior authorization is not required at this time. If your pharmacy has questions regarding the processing of your prescription, please have them call the [facility] pharmacy help desk at [phone]. **Please note: This request was submitted electronically. Formulary lowering, tiering exception, cost reduction and/or pre-benefit determination review (including prospective Medicare hospice reviews) requests cannot be requested using this method of submission. Providers contact us  at [phone] for further assistance. Drug Spiriva  Respimat 2.5MCG/ACT aerosol ePA cloud logo Form [facility] Medicare Part D Electronic Prior Authorization Form [phone] NCPDP) Original Claim Info 43 PA Initiated

## 2024-01-28 NOTE — Telephone Encounter (Signed)
 Prior Authorization for patient (Spiriva  Respimat 2.5MCG/ACT aerosol) came through on cover my meds was submitted with last office notes awaiting approval or denial.  XZB:[id number]

## 2024-01-29 ENCOUNTER — Other Ambulatory Visit: Payer: Self-pay | Admitting: Nephrology

## 2024-01-29 DIAGNOSIS — I5030 Unspecified diastolic (congestive) heart failure: Secondary | ICD-10-CM | POA: Diagnosis not present

## 2024-01-29 DIAGNOSIS — N1832 Chronic kidney disease, stage 3b: Secondary | ICD-10-CM | POA: Diagnosis not present

## 2024-01-29 DIAGNOSIS — E1122 Type 2 diabetes mellitus with diabetic chronic kidney disease: Secondary | ICD-10-CM | POA: Diagnosis not present

## 2024-01-29 DIAGNOSIS — N2889 Other specified disorders of kidney and ureter: Secondary | ICD-10-CM

## 2024-01-29 DIAGNOSIS — I129 Hypertensive chronic kidney disease with stage 1 through stage 4 chronic kidney disease, or unspecified chronic kidney disease: Secondary | ICD-10-CM | POA: Diagnosis not present

## 2024-01-30 LAB — LAB REPORT - SCANNED
Albumin, Urine POC: 123
Creatinine, POC: 101.3 mg/dL
EGFR: 28
Microalb Creat Ratio: 121

## 2024-02-05 ENCOUNTER — Ambulatory Visit
Admission: RE | Admit: 2024-02-05 | Discharge: 2024-02-05 | Disposition: A | Discharge status: Home / Self Care | Source: Ambulatory Visit | Attending: Nephrology | Admitting: Nephrology

## 2024-02-05 DIAGNOSIS — N2889 Other specified disorders of kidney and ureter: Secondary | ICD-10-CM

## 2024-02-05 DIAGNOSIS — N281 Cyst of kidney, acquired: Secondary | ICD-10-CM | POA: Diagnosis not present

## 2024-02-06 ENCOUNTER — Ambulatory Visit: Admitting: Podiatry

## 2024-02-06 ENCOUNTER — Encounter: Payer: Self-pay | Admitting: Podiatry

## 2024-02-06 DIAGNOSIS — M79675 Pain in left toe(s): Secondary | ICD-10-CM

## 2024-02-06 DIAGNOSIS — M79674 Pain in right toe(s): Secondary | ICD-10-CM

## 2024-02-06 DIAGNOSIS — I739 Peripheral vascular disease, unspecified: Secondary | ICD-10-CM | POA: Diagnosis not present

## 2024-02-06 DIAGNOSIS — B351 Tinea unguium: Secondary | ICD-10-CM | POA: Diagnosis not present

## 2024-02-06 DIAGNOSIS — E1142 Type 2 diabetes mellitus with diabetic polyneuropathy: Secondary | ICD-10-CM | POA: Diagnosis not present

## 2024-02-06 DIAGNOSIS — L84 Corns and callosities: Secondary | ICD-10-CM | POA: Diagnosis not present

## 2024-02-09 ENCOUNTER — Other Ambulatory Visit: Payer: Self-pay | Admitting: Internal Medicine

## 2024-02-10 ENCOUNTER — Encounter: Payer: Self-pay | Admitting: Podiatry

## 2024-02-10 NOTE — Progress Notes (Signed)
  Subjective:  Patient ID: Seth Santana, male    DOB: 1951-01-18,  MRN: [id number]  Seth Santana presents to clinic today for at risk footcare. Patient has h/o diabetes, neuropathy and PAD and is seen for  and callus(es) right great toe and painful mycotic toenails that are difficult to trim. Painful toenails interfere with ambulation. Aggravating factors include wearing enclosed shoe gear. Pain is relieved with periodic professional debridement. Painful calluses are aggravated when weightbearing with and without shoegear. Pain is relieved with periodic professional debridement.  Chief Complaint  Patient presents with   Diabetes    DFC NIDDM A1C 6.9. Toenail trim and calus care. LOV with PCP 06/07/23.   New problem(s): None.   PCP is Renne Homans, MD.  No Known Allergies  Review of Systems: Negative except as noted in the HPI.  Objective: No changes noted in today's physical examination. There were no vitals filed for this visit. Seth Santana is a pleasant 73 y.o. male WD, WN in NAD. AAO x 3.  Vascular Examination: CFT <4 seconds b/l. DP pulses diminished b/l. PT pulses diminished b/l. Digital hair absent. Skin temperature gradient warm to cool b/l. No ischemia or gangrene. No cyanosis or clubbing noted b/l. Evidence of skin changes consistent with long term venous stasis BLE.   Neurological Examination: Sensation grossly intact b/l with 10 gram monofilament. Vibratory sensation intact b/l.   Dermatological Examination: Pedal skin thin, shiny and atrophic b/l. No open wounds. No interdigital macerations.   Toenails 1-5 b/l thick, discolored, elongated with subungual debris and pain on dorsal palpation.   Hyperkeratotic lesion(s) plantar IPJ of right great toe.  No erythema, no edema, no drainage, no fluctuance. Hyperpigmentation consistent with findings of chronic venous insufficiency is present b/l lower extremities.  Musculoskeletal Examination: Muscle strength 5/5 to  all lower extremity muscle groups bilaterally. Pes planus deformity noted bilateral LE.Seth Santana No pain, crepitus or joint limitation noted with ROM b/l LE.  Patient ambulates independently without assistive aids.  Radiographs: None  Assessment/Plan: 1. Pain due to onychomycosis of toenails of both feet   2. Callus   3. PAD (peripheral artery disease)   4. Diabetic peripheral neuropathy associated with type 2 diabetes mellitus (HCC)   Patient was evaluated and treated. All patient's and/or POA's questions/concerns addressed on today's visit. Toenails 1-5 b/l debrided in length and girth without incident. Callus(es) plantar IPJ of right great toe pared with sharp debridement without incident. Treatment was provided by assistant Andrez Manchester under my supervision. Continue daily foot inspections and monitor blood glucose per PCP/Endocrinologist's recommendations. Continue soft, supportive shoe gear daily. Report any pedal injuries to medical professional. Call office if there are any questions/concerns.  Return in about 3 months (around 05/08/2024).  Seth Santana Merlin, DPM      [facility] LOCATION: [address] [place] [address]                   Office [phone]   [facility] LOCATION: [address] [place], [place] [address] Office [phone]

## 2024-03-04 ENCOUNTER — Other Ambulatory Visit: Payer: Self-pay

## 2024-03-05 MED ORDER — HYDRALAZINE HCL 50 MG PO TABS: 50.0000 mg | ORAL_TABLET | Freq: Two times a day (BID) | ORAL | 3 refills | Status: AC

## 2024-04-16 ENCOUNTER — Other Ambulatory Visit: Payer: Self-pay | Admitting: Internal Medicine

## 2024-04-17 ENCOUNTER — Other Ambulatory Visit: Payer: Self-pay | Admitting: *Deleted

## 2024-04-17 DIAGNOSIS — N1832 Chronic kidney disease, stage 3b: Secondary | ICD-10-CM

## 2024-04-17 MED ORDER — EMPAGLIFLOZIN 10 MG PO TABS: 10.0000 mg | ORAL_TABLET | Freq: Every day | ORAL | 2 refills | Status: AC

## 2024-04-17 MED ORDER — ROSUVASTATIN CALCIUM 40 MG PO TABS: 40.0000 mg | ORAL_TABLET | Freq: Every day | ORAL | 2 refills | Status: AC

## 2024-05-21 ENCOUNTER — Ambulatory Visit: Admitting: Podiatry
